# Patient Record
Sex: Female | Born: 1935 | Race: White | Hispanic: No | Marital: Single | Smoking: Never smoker
Health system: Southern US, Community
[De-identification: ages and names within clinical notes are randomized; demographics above are authoritative.]

## PROBLEM LIST (undated history)

## (undated) DIAGNOSIS — K219 Gastro-esophageal reflux disease without esophagitis: Secondary | ICD-10-CM

## (undated) DIAGNOSIS — I10 Essential (primary) hypertension: Secondary | ICD-10-CM

## (undated) DIAGNOSIS — M1990 Unspecified osteoarthritis, unspecified site: Secondary | ICD-10-CM

## (undated) DIAGNOSIS — E785 Hyperlipidemia, unspecified: Secondary | ICD-10-CM

## (undated) DIAGNOSIS — T7840XA Allergy, unspecified, initial encounter: Secondary | ICD-10-CM

## (undated) DIAGNOSIS — Z8619 Personal history of other infectious and parasitic diseases: Secondary | ICD-10-CM

## (undated) DIAGNOSIS — J189 Pneumonia, unspecified organism: Secondary | ICD-10-CM

## (undated) DIAGNOSIS — F0390 Unspecified dementia without behavioral disturbance: Secondary | ICD-10-CM

## (undated) DIAGNOSIS — Z9289 Personal history of other medical treatment: Secondary | ICD-10-CM

## (undated) HISTORY — DX: Allergy, unspecified, initial encounter: T78.40XA

## (undated) HISTORY — DX: Personal history of other medical treatment: Z92.89

## (undated) HISTORY — DX: Personal history of other infectious and parasitic diseases: Z86.19

## (undated) HISTORY — DX: Gastro-esophageal reflux disease without esophagitis: K21.9

## (undated) HISTORY — DX: Hyperlipidemia, unspecified: E78.5

## (undated) HISTORY — DX: Essential (primary) hypertension: I10

## (undated) HISTORY — PX: EYE SURGERY: SHX253

---

## 1999-06-30 ENCOUNTER — Encounter: Admission: RE | Admit: 1999-06-30 | Discharge: 1999-06-30 | Payer: Self-pay | Admitting: Family Medicine

## 1999-06-30 ENCOUNTER — Encounter: Payer: Self-pay | Admitting: Family Medicine

## 2000-07-02 ENCOUNTER — Encounter: Admission: RE | Admit: 2000-07-02 | Discharge: 2000-07-02 | Payer: Self-pay | Admitting: Internal Medicine

## 2000-07-02 ENCOUNTER — Encounter: Payer: Self-pay | Admitting: Internal Medicine

## 2001-07-14 ENCOUNTER — Encounter: Payer: Self-pay | Admitting: Internal Medicine

## 2001-07-14 ENCOUNTER — Encounter: Admission: RE | Admit: 2001-07-14 | Discharge: 2001-07-14 | Payer: Self-pay | Admitting: Internal Medicine

## 2001-10-03 ENCOUNTER — Encounter: Admission: RE | Admit: 2001-10-03 | Discharge: 2001-10-03 | Payer: Self-pay | Admitting: *Deleted

## 2001-10-03 ENCOUNTER — Encounter: Payer: Self-pay | Admitting: *Deleted

## 2002-06-19 ENCOUNTER — Emergency Department (HOSPITAL_COMMUNITY): Admission: EM | Admit: 2002-06-19 | Discharge: 2002-06-19 | Payer: Self-pay | Admitting: Emergency Medicine

## 2002-06-29 ENCOUNTER — Emergency Department (HOSPITAL_COMMUNITY): Admission: EM | Admit: 2002-06-29 | Discharge: 2002-06-29 | Payer: Self-pay | Admitting: Emergency Medicine

## 2002-07-16 ENCOUNTER — Encounter: Payer: Self-pay | Admitting: *Deleted

## 2002-07-16 ENCOUNTER — Encounter: Admission: RE | Admit: 2002-07-16 | Discharge: 2002-07-16 | Payer: Self-pay | Admitting: *Deleted

## 2003-08-04 ENCOUNTER — Encounter: Admission: RE | Admit: 2003-08-04 | Discharge: 2003-08-04 | Payer: Self-pay | Admitting: Family Medicine

## 2004-04-13 ENCOUNTER — Ambulatory Visit: Payer: Self-pay | Admitting: Family Medicine

## 2004-06-21 ENCOUNTER — Ambulatory Visit: Payer: Self-pay | Admitting: Family Medicine

## 2004-09-13 ENCOUNTER — Encounter: Admission: RE | Admit: 2004-09-13 | Discharge: 2004-09-13 | Payer: Self-pay | Admitting: Family Medicine

## 2005-07-18 ENCOUNTER — Ambulatory Visit: Payer: Self-pay | Admitting: Family Medicine

## 2005-07-18 ENCOUNTER — Other Ambulatory Visit: Admission: RE | Admit: 2005-07-18 | Discharge: 2005-07-18 | Payer: Self-pay | Admitting: Family Medicine

## 2005-07-18 ENCOUNTER — Encounter: Payer: Self-pay | Admitting: Family Medicine

## 2005-07-18 LAB — CONVERTED CEMR LAB: Pap Smear: NEGATIVE

## 2005-07-20 ENCOUNTER — Encounter: Payer: Self-pay | Admitting: Family Medicine

## 2005-07-20 LAB — CONVERTED CEMR LAB: Pap Smear: NORMAL

## 2005-08-07 ENCOUNTER — Ambulatory Visit: Payer: Self-pay | Admitting: Family Medicine

## 2005-08-09 ENCOUNTER — Ambulatory Visit: Payer: Self-pay | Admitting: Family Medicine

## 2005-09-20 ENCOUNTER — Encounter: Admission: RE | Admit: 2005-09-20 | Discharge: 2005-09-20 | Payer: Self-pay | Admitting: Family Medicine

## 2006-03-12 LAB — HM DEXA SCAN

## 2006-05-10 ENCOUNTER — Encounter: Admission: RE | Admit: 2006-05-10 | Discharge: 2006-05-10 | Payer: Self-pay | Admitting: Family Medicine

## 2007-01-02 ENCOUNTER — Encounter: Payer: Self-pay | Admitting: Family Medicine

## 2007-01-03 ENCOUNTER — Encounter: Payer: Self-pay | Admitting: Family Medicine

## 2007-01-03 DIAGNOSIS — I10 Essential (primary) hypertension: Secondary | ICD-10-CM

## 2007-01-03 DIAGNOSIS — J309 Allergic rhinitis, unspecified: Secondary | ICD-10-CM | POA: Insufficient documentation

## 2007-01-03 DIAGNOSIS — K219 Gastro-esophageal reflux disease without esophagitis: Secondary | ICD-10-CM | POA: Insufficient documentation

## 2007-01-03 DIAGNOSIS — E785 Hyperlipidemia, unspecified: Secondary | ICD-10-CM | POA: Insufficient documentation

## 2007-01-07 ENCOUNTER — Ambulatory Visit: Payer: Self-pay | Admitting: Family Medicine

## 2007-01-07 DIAGNOSIS — N390 Urinary tract infection, site not specified: Secondary | ICD-10-CM

## 2007-01-07 DIAGNOSIS — T50995A Adverse effect of other drugs, medicaments and biological substances, initial encounter: Secondary | ICD-10-CM | POA: Insufficient documentation

## 2007-01-07 DIAGNOSIS — E039 Hypothyroidism, unspecified: Secondary | ICD-10-CM | POA: Insufficient documentation

## 2007-01-07 LAB — CONVERTED CEMR LAB
Bilirubin Urine: NEGATIVE
Glucose, Urine, Semiquant: NEGATIVE
Specific Gravity, Urine: 1.02
Urobilinogen, UA: 0.2
pH: 6

## 2007-01-08 LAB — CONVERTED CEMR LAB
Alkaline Phosphatase: 78 units/L (ref 39–117)
BUN: 19 mg/dL (ref 6–23)
Basophils Relative: 0.4 % (ref 0.0–1.0)
Bilirubin, Direct: 0.1 mg/dL (ref 0.0–0.3)
CO2: 33 meq/L — ABNORMAL HIGH (ref 19–32)
Cholesterol: 244 mg/dL (ref 0–200)
GFR calc Af Amer: 90 mL/min
Glucose, Bld: 93 mg/dL (ref 70–99)
Hemoglobin: 14.2 g/dL (ref 12.0–15.0)
Lymphocytes Relative: 22.6 % (ref 12.0–46.0)
MCV: 88 fL (ref 78.0–100.0)
Monocytes Absolute: 0.6 10*3/uL (ref 0.2–0.7)
Monocytes Relative: 9.4 % (ref 3.0–11.0)
Neutro Abs: 4.6 10*3/uL (ref 1.4–7.7)
Potassium: 4.8 meq/L (ref 3.5–5.1)
TSH: 1.54 microintl units/mL (ref 0.35–5.50)
Total Protein: 6.8 g/dL (ref 6.0–8.3)
VLDL: 30 mg/dL (ref 0–40)

## 2007-01-30 ENCOUNTER — Encounter: Payer: Self-pay | Admitting: Family Medicine

## 2007-01-30 ENCOUNTER — Encounter: Admission: RE | Admit: 2007-01-30 | Discharge: 2007-01-30 | Payer: Self-pay | Admitting: Family Medicine

## 2007-02-13 ENCOUNTER — Telehealth: Payer: Self-pay | Admitting: Family Medicine

## 2007-04-10 ENCOUNTER — Ambulatory Visit: Payer: Self-pay | Admitting: Family Medicine

## 2007-04-15 ENCOUNTER — Ambulatory Visit: Payer: Self-pay | Admitting: Family Medicine

## 2007-04-21 LAB — CONVERTED CEMR LAB
ALT: 19 units/L (ref 0–35)
AST: 19 units/L (ref 0–37)
Bilirubin, Direct: 0.2 mg/dL (ref 0.0–0.3)
Cholesterol: 229 mg/dL (ref 0–200)
Total Bilirubin: 0.8 mg/dL (ref 0.3–1.2)
Total Protein: 6.7 g/dL (ref 6.0–8.3)

## 2007-04-22 ENCOUNTER — Encounter: Payer: Self-pay | Admitting: Family Medicine

## 2008-01-29 ENCOUNTER — Ambulatory Visit: Payer: Self-pay | Admitting: Family Medicine

## 2008-01-29 ENCOUNTER — Encounter: Payer: Self-pay | Admitting: Family Medicine

## 2008-01-29 ENCOUNTER — Other Ambulatory Visit: Admission: RE | Admit: 2008-01-29 | Discharge: 2008-01-29 | Payer: Self-pay | Admitting: Family Medicine

## 2008-01-29 DIAGNOSIS — E559 Vitamin D deficiency, unspecified: Secondary | ICD-10-CM | POA: Insufficient documentation

## 2008-01-29 DIAGNOSIS — D649 Anemia, unspecified: Secondary | ICD-10-CM | POA: Insufficient documentation

## 2008-01-29 DIAGNOSIS — J45909 Unspecified asthma, uncomplicated: Secondary | ICD-10-CM | POA: Insufficient documentation

## 2008-01-29 LAB — CONVERTED CEMR LAB
Nitrite: NEGATIVE
Urobilinogen, UA: 1

## 2008-01-29 LAB — HM PAP SMEAR

## 2008-02-01 LAB — CONVERTED CEMR LAB
ALT: 18 units/L (ref 0–35)
AST: 19 units/L (ref 0–37)
Albumin: 3.6 g/dL (ref 3.5–5.2)
Alkaline Phosphatase: 72 units/L (ref 39–117)
BUN: 19 mg/dL (ref 6–23)
CO2: 30 meq/L (ref 19–32)
Eosinophils Relative: 1.4 % (ref 0.0–5.0)
GFR calc Af Amer: 79 mL/min
Glucose, Bld: 99 mg/dL (ref 70–99)
HCT: 41.4 % (ref 36.0–46.0)
Hemoglobin: 14.3 g/dL (ref 12.0–15.0)
Monocytes Absolute: 0.4 10*3/uL (ref 0.1–1.0)
Monocytes Relative: 6.8 % (ref 3.0–12.0)
Platelets: 192 10*3/uL (ref 150–400)
Potassium: 4 meq/L (ref 3.5–5.1)
Total CHOL/HDL Ratio: 4
Total Protein: 7 g/dL (ref 6.0–8.3)
Triglycerides: 115 mg/dL (ref 0–149)
WBC: 6.4 10*3/uL (ref 4.5–10.5)

## 2008-02-03 LAB — CONVERTED CEMR LAB: Vit D, 1,25-Dihydroxy: 21 — ABNORMAL LOW (ref 30–89)

## 2008-02-04 ENCOUNTER — Encounter: Payer: Self-pay | Admitting: Family Medicine

## 2008-02-13 ENCOUNTER — Ambulatory Visit: Payer: Self-pay | Admitting: Family Medicine

## 2008-02-17 ENCOUNTER — Encounter: Payer: Self-pay | Admitting: Family Medicine

## 2009-04-06 ENCOUNTER — Emergency Department (HOSPITAL_COMMUNITY): Admission: EM | Admit: 2009-04-06 | Discharge: 2009-04-06 | Payer: Self-pay | Admitting: Emergency Medicine

## 2009-04-20 ENCOUNTER — Ambulatory Visit: Payer: Self-pay | Admitting: Family Medicine

## 2009-04-20 DIAGNOSIS — M810 Age-related osteoporosis without current pathological fracture: Secondary | ICD-10-CM | POA: Insufficient documentation

## 2009-04-20 LAB — CONVERTED CEMR LAB
Ketones, urine, test strip: NEGATIVE
Nitrite: NEGATIVE
Specific Gravity, Urine: 1.02
Urobilinogen, UA: 0.2

## 2009-04-21 ENCOUNTER — Encounter: Payer: Self-pay | Admitting: Family Medicine

## 2009-04-21 ENCOUNTER — Encounter: Admission: RE | Admit: 2009-04-21 | Discharge: 2009-04-21 | Payer: Self-pay | Admitting: Family Medicine

## 2009-04-25 LAB — CONVERTED CEMR LAB
ALT: 17 units/L (ref 0–35)
AST: 23 units/L (ref 0–37)
Albumin: 3.8 g/dL (ref 3.5–5.2)
Alkaline Phosphatase: 78 units/L (ref 39–117)
Basophils Relative: 0.1 % (ref 0.0–3.0)
CO2: 27 meq/L (ref 19–32)
Calcium: 10.2 mg/dL (ref 8.4–10.5)
GFR calc non Af Amer: 64.68 mL/min (ref >60)
Glucose, Bld: 96 mg/dL (ref 70–99)
HCT: 42.4 % (ref 36.0–46.0)
HDL: 62.7 mg/dL (ref >39.00)
Hemoglobin: 13.9 g/dL (ref 12.0–15.0)
Lymphocytes Relative: 19.7 % (ref 12.0–46.0)
Lymphs Abs: 1.2 10*3/uL (ref 0.7–4.0)
Monocytes Relative: 5.2 % (ref 3.0–12.0)
Neutro Abs: 4.7 10*3/uL (ref 1.4–7.7)
Potassium: 4.1 meq/L (ref 3.5–5.1)
RBC: 4.7 M/uL (ref 3.87–5.11)
Sodium: 139 meq/L (ref 135–145)
TSH: 1.92 microintl units/mL (ref 0.35–5.50)
Total CHOL/HDL Ratio: 4
Total Protein: 7.3 g/dL (ref 6.0–8.3)
Triglycerides: 128 mg/dL (ref 0.0–149.0)

## 2009-05-19 ENCOUNTER — Ambulatory Visit: Payer: Self-pay | Admitting: Family Medicine

## 2009-06-08 ENCOUNTER — Encounter: Payer: Self-pay | Admitting: Family Medicine

## 2009-06-15 ENCOUNTER — Ambulatory Visit: Payer: Self-pay | Admitting: Family Medicine

## 2009-06-15 DIAGNOSIS — R05 Cough: Secondary | ICD-10-CM

## 2009-06-23 ENCOUNTER — Telehealth: Payer: Self-pay | Admitting: Family Medicine

## 2009-07-19 ENCOUNTER — Ambulatory Visit: Payer: Self-pay | Admitting: Family Medicine

## 2009-09-01 ENCOUNTER — Ambulatory Visit: Payer: Self-pay | Admitting: Family Medicine

## 2010-01-12 ENCOUNTER — Telehealth: Payer: Self-pay | Admitting: Family Medicine

## 2010-04-11 NOTE — Letter (Signed)
Summary: Generic Letter  [place] at [facility]  [address] [place], [place] [address]   Phone: [phone]  Fax: [phone]    09/01/2009    To Whom It May Concern:           Jackelin Correia (DOB Jul 22, 1931) was seen and examined by me on this date. She was given a physical exam and evaluated thoroughly regarding muscular-skeletal and neurological status.  She is found to be strong and physically fit to work.  If any questions please do not hesitate to call me.        Sincerely,      Dr. Hosie Spangle. Alphonzo Severance. MD

## 2010-04-11 NOTE — Letter (Signed)
Summary: Generic Letter  [place] at [facility]  [address] [place], [place] [address]   Phone: [phone]  Fax: [phone]    06/08/2009  Kelly Conrad [address] [place], [place]  [address]  Dear [name],   Hemocults are all negative.        Sincerely,   Dr. Raoul Pitch

## 2010-04-11 NOTE — Assessment & Plan Note (Signed)
Summary: deep non-productive cough/cjr   Vital Signs:  Patient profile:   77 year old female Weight:      148 pounds O2 Sat:      95 % Temp:     98.1 degrees F Pulse rate:   95 / minute BP sitting:   110 / 76  (left arm) Cuff size:   regular  Vitals Entered By: Pura Spice, RN (June 15, 2009 10:04 AM) CC: cough sneezing tussin helping    History of Present Illness: DiCenso 74-year-old white female with a physician today complaining of a call, nonproductive that has been present in the past for some time her son has helped her in previously and in the past she has had a bronchitis but this time has no fever as stated a nonproductive cough the cough that aggravates more at night Hypertension is controlled as well as her GERD Bone density recently revealed that she has osteoporosisand I feel that she needs to have a biphosphonateplus calcium and vitamin D  Allergies: 1)  ! 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    Physical Exam  General:  Well-developed,well-nourished,in no acute distress; alert,appropriate and cooperative throughout examination Head:  Normocephalic and atraumatic without obvious abnormalities. No apparent alopecia or balding. Eyes:  No corneal or conjunctival inflammation noted. EOMI. Perrla. Funduscopic exam benign, without hemorrhages, exudates or papilledema. Vision grossly normal. Ears:  External ear exam shows no significant lesions or deformities.  Otoscopic examination reveals clear canals, tympanic membranes are intact bilaterally without bulging, retraction, inflammation or discharge. Hearing is grossly normal bilaterally. Nose:  lower moderate nasal congestion, pale boggy mucosa with clear drainage Mouth:  Oral mucosa and oropharynx without lesions or exudates.  Teeth in good repair. Lungs:  Normal respiratory effort, chest expands symmetrically. Lungs are clear to auscultation, no crackles or wheezes. Heart:  Normal rate and regular rhythm. S1 and S2 normal without gallop, murmur, click, rub or other extra sounds. Extremities:  No clubbing, cyanosis, edema, or deformity noted with normal full range of motion of all joints.     Impression & Recommendations:  Problem # 1:  COUGH (ICD-786.2) Assessment Deteriorated  Orders: T-2 View CXR (71020TC) Prescription Created Electronically [phone]) tramadol q.i.d. plus OTC antihistamine  Problem # 2:  OSTEOPOROSIS (ICD-733.00) Assessment: Unchanged  The following medications were removed from the medication list:    Actonel 150 Mg Tabs (Risedronate sodium) .Marland Kitchen... 1 by mouth every month Her updated medication list for this problem includes:    Caltrate 600+d Plus 600-400 Mg-unit Tabs (Calcium carbonate-vit d-min)    Vitamin D (ergocalciferol) 50000 Unit Caps (  Ergocalciferol) .Marland Kitchen... 1 cap weekly x 12 weeks    Fosamax 35 Mg Tabs (Alendronate sodium) .Marland Kitchen... 1 by mouth  weekly  Problem # 3:  GERD (ICD-530.81) Assessment: Improved  Her updated medication list for this problem includes:    Omeprazole 20 Mg Cpdr (Omeprazole) ..... Once daily  Problem # 4:  HYPERTENSION (ICD-401.9) Assessment: Improved  Her updated medication list for this problem includes:    Hydrochlorothiazide 25 Mg Tabs (Hydrochlorothiazide) ..... Once daily    Lisinopril 20 Mg Tabs (Lisinopril) .Marland Kitchen... 1 once daily for blood pressure  Problem # 5:  ALLERGIC RHINITIS (ICD-477.9) Assessment: Unchanged  Complete Medication List: 1)  Hydrochlorothiazide 25 Mg Tabs (Hydrochlorothiazide) .... Once daily 2)  Omeprazole 20 Mg Cpdr (Omeprazole) .... Once daily 3)  Aspirin Adult Low Strength 81 Mg Tbec (Aspirin) .Marland Kitchen.. 1 once daily 4)  Wal-tussin Cough/cold 30-15 Mg/31ml Syrp (Pseudoephedrine-dm) 5)  Caltrate 600+d Plus 600-400 Mg-unit Tabs (Calcium carbonate-vit d-min) 6)  Lisinopril 20 Mg Tabs (Lisinopril) .Marland Kitchen.. 1 once daily for blood pressure 7)  Vitamin D (ergocalciferol) 50000 Unit Caps (Ergocalciferol) .Marland Kitchen.. 1 cap weekly x 12 weeks 8)  Simvastatin 80 Mg Tabs (Simvastatin) .Marland Kitchen.. 1 hs for lipids 9)  Hydromet 5-1.5 Mg/20ml Syrp (Hydrocodone-homatropine) .Marland Kitchen.. 1-2 tsp q4h as needed cough 10)  Tramadol Hcl 50 Mg Tabs (Tramadol hcl) .Marland Kitchen.. 1 qid to prevent cough 11)  Fosamax 35 Mg Tabs (Alendronate sodium) .Marland Kitchen.. 1 by mouth weekly  Patient Instructions: 1)  I feel that she have a chronic cough and most lateral he did to allergy 2)  since the cough has been persistent I feel that we should get a chest x-ray at this time 3)  We'll prescribe tramadol 4 times a day to stop the cough as well as  4)  Over-the-counter Claritin 10 mg each day 5)  Start Actonel plus calcium and vitamin D for your osteoporosis 6)  continue omeprazole for your indigestion or GERD Prescriptions: FOSAMAX 35 MG TABS (ALENDRONATE SODIUM) 1 by mouth weekly  #4 x 11   Entered by:   Pura Spice, RN   Authorized by:   Judithann Sheen MD   Signed by:   Pura Spice, RN on 06/23/2009   Method used:   [facility]  [place] [address]       [address]       [place], [place]  [address]       Ph: [phone] or [phone]       Fax: [phone]   RxID:   [id number] TRAMADOL HCL 50 MG TABS (TRAMADOL HCL) 1 qid to prevent cough  #60 x 5   Entered and Authorized by:   Judithann Sheen MD   Signed by:   Judithann Sheen MD on 06/15/2009   Method used:   [facility]        [facility]  [place] [address] ([facility]       [address]       [place], [place]  [address]       Ph: [phone] or [phone]       Fax: [phone]   RxID:   [id number] HYDROMET 5-1.5 MG/5ML SYRP (HYDROCODONE-HOMATROPINE) 1-2 tsp q4h as needed cough  #360 cc x 2   Entered and Authorized by:   Judithann Sheen MD   Signed by:   Judithann Sheen MD on 06/15/2009  Method used:   Print then Give to Patient   RxID:   [id number]

## 2010-04-11 NOTE — Assessment & Plan Note (Signed)
Summary: 3 month fup//ccm   Vital Signs:  Patient profile:   77 year old female Weight:      150 pounds O2 Sat:      96 % Temp:     97.9 degrees F Pulse rate:   82 / minute BP sitting:   110 / 60  (left arm)  Vitals Entered By: Pura Spice, RN (Jul 19, 2009 11:33 AM) CC: ROV ? need refill on tramadol  follow up cough cxr report    History of Present Illness: This 77 year old white female is into have a follow up visit regarding her chronic cough which she has had in the past. Review of chest x-ray which revealed no cardiopulmonary problems since use of tramadol q.n. today and the Hydromet when needed, the patient had greatly improved and relates she is coughing and definitely nonproductive Patient relates she feels very good  Allergies: 1)  ! 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    Physical Exam  General:  Well-developed,well-nourished,in no acute distress; alert,appropriate and  cooperative throughout examination Head:  Normocephalic and atraumatic without obvious abnormalities. No apparent alopecia or balding. Eyes:  No corneal or conjunctival inflammation noted. EOMI. Perrla. Funduscopic exam benign, without hemorrhages, exudates or papilledema. Vision grossly normal. Ears:  External ear exam shows no significant lesions or deformities.  Otoscopic examination reveals clear canals, tympanic membranes are intact bilaterally without bulging, retraction, inflammation or discharge. Hearing is grossly normal bilaterally. Nose:  External nasal examination shows no deformity or inflammation. Nasal mucosa are pink and moist without lesions or exudates. Mouth:  Oral mucosa and oropharynx without lesions or exudates.  Teeth in good repair. Neck:  No deformities, masses, or tenderness noted. Chest Wall:  No deformities, masses, or tenderness noted. Lungs:  Normal respiratory effort, chest expands symmetrically. Lungs are clear to auscultation, no crackles or wheezes. Heart:  Normal rate and regular rhythm. S1 and S2 normal without gallop, murmur, click, rub or other extra sounds. Extremities:  No clubbing, cyanosis, edema, or deformity noted with normal full range of motion of all joints.     Impression & Recommendations:  Problem # 1:  COUGH (ICD-786.2) Assessment Improved  Problem # 2:  OSTEOPOROSIS (ICD-733.00) Assessment: Improved  Her updated medication list for this problem includes:    Caltrate 600+d Plus 600-400 Mg-unit Tabs (Calcium carbonate-vit d-min)    Vitamin D (ergocalciferol) 50000 Unit Caps (Ergocalciferol) .Marland Kitchen... 1 cap weekly x 12 weeks    Fosamax 35 Mg Tabs (Alendronate sodium) .Marland Kitchen... 1 by mouth weekly  Problem # 3:  GERD (ICD-530.81) Assessment: Improved  Her updated medication list for this problem includes:    Omeprazole 20  Mg Cpdr (Omeprazole) ..... Once daily  Problem # 4:  HYPERTENSION (ICD-401.9) Assessment: Improved  Her updated medication  list for this problem includes:    Hydrochlorothiazide 25 Mg Tabs (Hydrochlorothiazide) ..... Once daily    Lisinopril 20 Mg Tabs (Lisinopril) .Marland Kitchen... 1 once daily for blood pressure  Complete Medication List: 1)  Hydrochlorothiazide 25 Mg Tabs (Hydrochlorothiazide) .... Once daily 2)  Omeprazole 20 Mg Cpdr (Omeprazole) .... Once daily 3)  Aspirin Adult Low Strength 81 Mg Tbec (Aspirin) .Marland Kitchen.. 1 once daily 4)  Wal-tussin Cough/cold 30-15 Mg/64ml Syrp (Pseudoephedrine-dm) 5)  Caltrate 600+d Plus 600-400 Mg-unit Tabs (Calcium carbonate-vit d-min) 6)  Lisinopril 20 Mg Tabs (Lisinopril) .Marland Kitchen.. 1 once daily for blood pressure 7)  Vitamin D (ergocalciferol) 50000 Unit Caps (Ergocalciferol) .Marland Kitchen.. 1 cap weekly x 12 weeks 8)  Simvastatin 80 Mg Tabs (Simvastatin) .Marland Kitchen.. 1 hs for lipids 9)  Hydromet 5-1.5 Mg/40ml Syrp (Hydrocodone-homatropine) .Marland Kitchen.. 1-2 tsp q4h as needed cough 10)  Tramadol Hcl 50 Mg Tabs (Tramadol hcl) .Marland Kitchen.. 1 qid to prevent cough 11)  Fosamax 35 Mg Tabs (Alendronate sodium) .Marland Kitchen.. 1 by mouth weekly  Patient Instructions: 1)  You are doing very well 2)  no abnormal physical findings 3)  Cough is controlled with tramadol and usde hydrometas needed 4)  Return for routine exam or if any acute problem or new problem do not hesitate to return

## 2010-04-11 NOTE — Progress Notes (Signed)
Summary: REQ FOR RX?  Phone Note Call from Patient   Caller: Kelly Conrad Kelly Conrad) Reason for Call: Talk to Nurse, Talk to Doctor Summary of Call: Called to adv that med: actonel 150 mg cost too much through insurance if Rx is written as it is...Marland KitchenMarland Kitchen Pts POA adv that [employer] (pts drug ins co) adv that because the current Rx is written for pt to take one per month, it costs more and pt only gets one pill.....Marland KitchenMarland KitchenWants to know if a Rx can be written for pt to obtain 3 pills at a time but still only take one per month, because it will be a lower cost for the med / Rx and the pt will end up getting 3 mths worth from one Rx...Marland KitchenMarland KitchenMarland Kitchen   [facility].  Pts POA Kelly Conrad) can be reached at [phone]   or   [phone].   Initial call taken by: Debbra Riding,  June 23, 2009 8:34 AM  Follow-up for Phone Call        ok [facility].  Follow-up by: Pura Spice, RN,  June 23, 2009 2:23 PM

## 2010-04-11 NOTE — Progress Notes (Signed)
Summary: Hydrocodone-Homatropine Syrup  Phone Note Refill Request Message from:  Fax from Pharmacy on January 12, 2010 9:58 AM  Refills Requested: Medication #1:  HYDROMET 5-1.5 MG/5ML SYRP 1-2 tsp q4h as needed cough   Dosage confirmed as above?Dosage Confirmed Please advise refill?  Initial call taken by: Josph Macho RMA,  January 12, 2010 10:00 AM  Follow-up for Phone Call        OK to refill with same sig, #360, 0 rf, just ask her what her symptoms are Follow-up by: Danise Edge MD,  January 12, 2010 1:26 PM  Additional Follow-up for Phone Call Additional follow up Details #1::        I tried to call pt and [phone] is not the correct phone number?. Additional Follow-up by: Josph Macho RMA,  January 12, 2010 1:30 PM    Prescriptions: HYDROMET 5-1.5 MG/5ML SYRP (HYDROCODONE-HOMATROPINE) 1-2 tsp q4h as needed cough  #360 cc x 0   Entered by:   Josph Macho RMA   Authorized by:   Danise Edge MD   Signed by:   Josph Macho RMA on 01/12/2010   Method used:   Telephoned to ...       [facility]  [place] [address] (retail)       [address]       [place], [place]  [address]       Ph: [phone] or [phone]       Fax: [phone]   RxID:   [id number]

## 2010-04-11 NOTE — Assessment & Plan Note (Signed)
Summary: PT WILL COME IN FASTING / CPX // RS   Vital Signs:  Patient profile:   77 year old female Height:      62 inches Weight:      154 pounds BMI:     28.27 O2 Sat:      96 % Temp:     97.6 degrees F Pulse rate:   59 / minute BP sitting:   154 / 88  (left arm) Cuff size:   regular  Vitals Entered By: Pura Spice, RN (April 20, 2009 10:42 AM) CC: go over problems refill meds . pt states dont think has had colonscopy. needs bone density and mammogram.  Is Patient Diabetic? No Pain Assessment Patient in pain? no        History of Present Illness: This 77 year old white female is in today to discuss her medical problems and refill her medication Blood pressure been elevated before and felt lightheaded today one 5488 on arrival and recheck later 156/90 The patient relates she had been doing for a while has occasional cough which using OTC [employer]  tussin Needs to schedule mammogram and bone density. Pneumovax injection 2000 and Relates her frequent indigestion in the past have been control with omeprazole 20 mg each side Peripheral edema has been control since she is taking hydrochlorothiazide 25 mg each day  Allergies: 1)  ! 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[employer]  [employer]  Risk Factors: Smoking Status: never (01/29/2008)  Review of Systems  The patient denies anorexia, fever, weight loss, weight gain, vision loss, decreased hearing, hoarseness, chest pain, syncope, dyspnea on exertion, peripheral edema, prolonged cough, headaches, hemoptysis, abdominal pain, melena, hematochezia, severe indigestion/heartburn, hematuria,  incontinence, genital sores, muscle weakness, suspicious skin lesions, transient blindness, difficulty walking, depression, unusual weight change, abnormal bleeding, enlarged lymph nodes, angioedema, breast masses, and testicular masses.    Physical Exam  General:  Well-developed,well-nourished,in no acute distress; alert,appropriate and cooperative throughout examination Head:  Normocephalic and atraumatic without obvious abnormalities. No apparent alopecia or balding. Eyes:  No corneal or conjunctival inflammation noted. EOMI. Perrla. Funduscopic exam benign, without hemorrhages, exudates or papilledema. Vision grossly normal. Ears:  External ear exam shows no significant lesions or deformities.  Otoscopic examination reveals clear canals, tympanic membranes are intact bilaterally without bulging, retraction, inflammation or discharge. Hearing is grossly normal bilaterally. Nose:  External nasal examination shows no deformity or inflammation. Nasal mucosa are pink and moist without lesions or exudates. Mouth:  new dentures, normal exam Neck:  No deformities, masses, or tenderness noted. Chest Wall:  No deformities, masses, or tenderness noted. Breasts:  No mass, nodules, thickening, tenderness, bulging, retraction, inflamation, nipple discharge or skin changes noted.   Lungs:  Normal respiratory effort, chest expands symmetrically. Lungs are clear to auscultation, no crackles or wheezes. Heart:  Normal rate and regular rhythm. S1 and S2 normal without gallop, murmur, click, rub or other extra sounds. Abdomen:  Bowel sounds positive,abdomen soft and non-tender without masses, organomegaly or hernias noted. Rectal:  not examined Genitalia:  not examined Msk:  No deformity or scoliosis noted of thoracic or lumbar spine.   Pulses:  R and L carotid,radial,femoral,dorsalis pedis and posterior tibial  pulses are full and equal bilaterally Extremities:  No clubbing, cyanosis, edema, or deformity noted  with normal full range of motion of all joints.   Neurologic:  No cranial nerve deficits noted. Station and gait are normal. Plantar reflexes are down-going bilaterally. DTRs are symmetrical throughout. Sensory, motor and coordinative functions appear intact. Skin:  Intact without suspicious lesions or rashes Cervical Nodes:  No lymphadenopathy noted Axillary Nodes:  No palpable lymphadenopathy Inguinal Nodes:  No significant adenopathy Psych:  Cognition and judgment appear intact. Alert and cooperative with normal attention span and concentration. No apparent delusions, illusions, hallucinations   Impression & Recommendations:  Problem # 1:  ANEMIA (ICD-285.9) Assessment Improved  Orders: TLB-CBC Platelet - w/Differential (85025-CBCD)  Problem # 2:  GERD (ICD-530.81) Assessment: Improved  Her updated medication list for this problem includes:    Omeprazole 20 Mg Cpdr (Omeprazole) ..... Once daily  Problem # 3:  OSTEOPOROSIS (ICD-733.00) Assessment: New  The following medications were removed from the medication list:    Vitamin D 04540 Unit Caps (Ergocalciferol) ..... One tab weekly for 12 weeks Her updated medication list for this problem includes:    Caltrate 600+d Plus 600-400 Mg-unit Tabs (Calcium carbonate-vit d-min)    Vitamin D (ergocalciferol) 50000 Unit Caps (Ergocalciferol) .Marland Kitchen... 1 cap weekly x 12 weeks  Problem # 4:  HYPERTENSION (ICD-401.9) Assessment: Deteriorated  lisinopril 20 mg q.d.  Orders: EKG w/ Interpretation (93000) Prescription Created Electronically [phone])  Problem # 5:  HYPERLIPIDEMIA (ICD-272.4) Assessment: Deteriorated simvastatin 80 mg bedtime  Problem # 6:  ALLERGIC RHINITIS (ICD-477.9) Assessment: Unchanged  Complete Medication List: 1)  Hydrochlorothiazide 25 Mg Tabs (Hydrochlorothiazide) .... Once daily 2)  Omeprazole 20 Mg Cpdr (Omeprazole) .... Once daily 3)  Aspirin Adult Low Strength 81 Mg Tbec (Aspirin) .Marland Kitchen.. 1 once daily 4)   Wal-tussin Cough/cold 30-15 Mg/80ml Syrp (Pseudoephedrine-dm) 5)  Caltrate 600+d Plus 600-400 Mg-unit Tabs (Calcium carbonate-vit d-min) 6)  Lisinopril 20 Mg Tabs (Lisinopril) .Marland Kitchen.. 1 once daily for blood pressure 7)  Vitamin D (ergocalciferol) 50000 Unit Caps (Ergocalciferol) .Marland Kitchen.. 1 cap weekly x 12 weeks 8)  Simvastatin 80 Mg Tabs (Simvastatin) .Marland Kitchen.. 1 hs for lipids  Other Orders: Venipuncture (14782) T-Vitamin D (25-Hydroxy) (95621-30865) UA Dipstick w/o Micro (automated)  (81003) TLB-Lipid Panel (80061-LIPID) TLB-BMP (Basic Metabolic Panel-BMET) (80048-METABOL) TLB-Hepatic/Liver Function Pnl (80076-HEPATIC) TLB-TSH (Thyroid Stimulating Hormone) (78469-GEX)  Patient Instructions: 1)  Since your blood pressure and an elevated I am going to start treatment with lisinopril 20 mg each day to control your blood pressure. Return 4-6 weeks for recheck 2)  Bone density reveal osteoporosis left heel recommend starting Actonel one 50 mg monthly 3)  Since her cholesterol studies were elevated and will start simvastatin 80 mg bedtime to better control the lipids Prescriptions: SIMVASTATIN 80 MG TABS (SIMVASTATIN) 1 hs for lipids  #30 x 11   Entered and Authorized by:   Judithann Sheen MD   Signed by:   Judithann Sheen MD on 04/21/2009   Method used:   [facility] [address]       [address]       [place], [place]  [address]       Ph: [phone] or [phone]       Fax: [phone]   RxID:   [id number] VITAMIN D (ERGOCALCIFEROL) 50000 UNIT CAPS (ERGOCALCIFEROL) 1 cap weekly x 12 weeks  #12 x 1   Entered  and Authorized by:   Judithann Sheen MD   Signed by:   Judithann Sheen MD on 04/21/2009   Method used:   [facility]        [facility] [address] ([facility]ail[facility]       [address]       [place], [place]  [address]       Ph: [phone] or [phone]       Fax: [phone]   RxID:   [id number] LISINOPRIL 20 MG TABS  (LISINOPRIL) 1 once daily FOR BLOOD PRESSURE  #30 x 11   Entered and Authorized by:   Judithann Sheen MD   Signed by:   Judithann Sheen MD on 04/20/2009   Method used:   [facility]        [facility] [address] ([facility]       [address]       [place], [place]  [address]       Ph: [phone] or [phone]       Fax: [phone]   RxID:   [id number] SIMVASTATIN 40 MG TABS (SIMVASTATIN) once daily at bedtiime  #30 x 11   Entered and Authorized by:   Judithann Sheen MD   Signed by:   Judithann Sheen MD on 04/20/2009   Method used:   [facility]        [facility] [address]       [address]       [place], [place]  [address]       Ph: [phone] or [phone]       Fax: [phone]   RxID:   [id number] OMEPRAZOLE 20 MG CPDR (OMEPRAZOLE) once daily  #30 x 11   Entered and Authorized by:   Judithann Sheen MD   Signed by:   Judithann Sheen MD on 04/20/2009   Method used:   [facility]        [facility] [address]ail[facility]       [address]       [place], [place]  [address]       Ph: [phone] or [phone]       Fax: [phone]   RxID:   [id number] HYDROCHLOROTHIAZIDE 25 MG TABS (HYDROCHLOROTHIAZIDE) once daily  #30 x 11   Entered and Authorized by:   Judithann Sheen MD   Signed by:   Judithann Sheen MD on 04/20/2009   Method used:   [facility]        [facility] [address] [facility]       [address]       [place], [place]  [address]       Ph: [phone] or [phone]       Fax: [phone]   RxID:   [id number]    Laboratory Results   Urine Tests    Routine Urinalysis   Color: yellow Appearance: Clear Glucose: negative   (Normal Range: Negative) Bilirubin: negative   (Normal Range: Negative) Ketone: negative   (Normal Range: Negative) Spec. Gravity: 1.020   (Normal Range: 1.003-1.035) Blood: negative   (Normal Range: Negative) pH: 6.5   (Normal Range:  5.0-8.0) Protein: negative   (Normal Range: Negative) Urobilinogen: 0.2   (Normal Range: 0-1) Nitrite: negative   (Normal Range: Negative) Leukocyte Esterace:  1+   (Normal Range: Negative)    Comments: Rita Ohara  April 20, 2009 12:00 PM

## 2010-04-11 NOTE — Assessment & Plan Note (Signed)
Summary: PT NEED LETTER TO GO TO WORK/RCD   Vital Signs:  Patient profile:   77 year old female Weight:      148 pounds O2 Sat:      94 % Temp:     97.9 degrees F Pulse rate:   70 / minute Pulse rhythm:   regular BP sitting:   104 / 60  (left arm) Cuff size:   regular  Vitals Entered By: Pura Spice, RN (September 01, 2009 10:56 AM) CC: needs letter for work.    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. 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 Room for her knee and not to state she is physically able to left patient's as well as do the work which he required she explained and discussed her job responsibilities and I will examine her and send a letter to her employer She has no complaints and explains to me what she does in the dinni  Allergies: 1)  ! 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    Physical  Exam  General:  Well-developed,well-nourished,in no acute distress; alert,appropriate and cooperative throughout examination Chest Wall:  No deformities, masses, or tenderness noted. Lungs:  Normal respiratory effort, chest expands symmetrically. Lungs are clear to auscultation, no crackles or wheezes. Heart:  Normal rate and regular rhythm. S1 and S2 normal without gallop, murmur, click, rub or other extra sounds. Abdomen:  Bowel sounds positive,abdomen soft and non-tender without masses, organomegaly or hernias noted. Msk:  No deformity or scoliosis noted of thoracic or lumbar spine.   excellent muscle strength in all extremities Neurologic:  note neurological deficits   Impression & Recommendations:  Problem # 1:  PHYSICAL EXAMINATION (ICD-V70.0) Assessment New candidate in good physical condition and her strong for female of her age  Problem # 2:  HYPERTENSION (ICD-401.9) Assessment: Improved  Her updated medication list for this problem includes:    Hydrochlorothiazide 25 Mg Tabs (Hydrochlorothiazide) ..... Once daily    Lisinopril 20 Mg Tabs (Lisinopril) .Marland Kitchen... 1 once daily for blood pressure  Complete Medication List: 1)  Hydrochlorothiazide 25 Mg Tabs (Hydrochlorothiazide) .... Once daily 2)  Omeprazole 20 Mg Cpdr (Omeprazole) .... Once daily 3)  Aspirin Adult Low Strength 81 Mg Tbec (Aspirin) .Marland Kitchen.. 1 once daily 4)  Wal-tussin Cough/cold 30-15 Mg/22ml Syrp (Pseudoephedrine-dm) 5)  Caltrate 600+d Plus 600-400 Mg-unit Tabs (Calcium carbonate-vit d-min) 6)  Lisinopril 20 Mg Tabs (Lisinopril) .Marland Kitchen.. 1 once daily for blood pressure 7)  Vitamin D (ergocalciferol) 50000 Unit Caps (Ergocalciferol) .Marland Kitchen.. 1 cap weekly x 12 weeks 8)  Simvastatin 80 Mg Tabs (Simvastatin) .Marland KitchenMarland KitchenMarland Kitchen  1 hs for lipids 9)  Hydromet 5-1.5 Mg/56ml Syrp (Hydrocodone-homatropine) .Marland Kitchen.. 1-2 tsp q4h as needed cough 10)  Tramadol Hcl 50 Mg Tabs (Tramadol hcl) .Marland Kitchen.. 1 qid to prevent cough 11)  Fosamax 35 Mg Tabs (Alendronate  sodium) .Marland Kitchen.. 1 by mouth weekly  Patient Instructions: 1)  Positive urine good physical condition tube care doctor or soft doses of her job and I will secure lead her to take to your emplower

## 2010-05-08 ENCOUNTER — Other Ambulatory Visit: Payer: Self-pay | Admitting: Family Medicine

## 2010-05-10 ENCOUNTER — Telehealth: Payer: Self-pay | Admitting: Family Medicine

## 2010-05-10 NOTE — Telephone Encounter (Signed)
Triage vm----please verify is she is to take Lipitor 20mg  , 1qam and 1qpm. Lipitor 40 was called to her pharmacy. Please return his cal. She only has 2 pills left.

## 2010-05-11 NOTE — Telephone Encounter (Signed)
Kelly Conrad came in to office this morning, and said that the medicine in question, is Lisinopril not Lipitor. Pt thought she had been told to take two 20mg  of Lisinopril a day, but when she does that, pt ends up running out of med to soon. Pt is wondering if she could get a 40 mg dose of Lisinopril and take it once a day? Pls call and clarify what dosage pt is suppose to be taking. Pt is out of Lisinopril now. Pls [facility] to [facility].

## 2010-05-16 NOTE — Telephone Encounter (Signed)
Continue 1 lisinopril 20 mg qd

## 2010-05-17 ENCOUNTER — Other Ambulatory Visit: Payer: Self-pay | Admitting: Family Medicine

## 2010-06-04 ENCOUNTER — Other Ambulatory Visit: Payer: Self-pay | Admitting: Family Medicine

## 2010-08-11 ENCOUNTER — Other Ambulatory Visit: Payer: Self-pay | Admitting: Family Medicine

## 2010-08-29 ENCOUNTER — Other Ambulatory Visit: Payer: Self-pay

## 2010-08-29 MED ORDER — HYDROCODONE-HOMATROPINE 5-1.5 MG PO TABS: ORAL_TABLET | ORAL | Status: DC

## 2010-08-29 NOTE — Telephone Encounter (Signed)
rx sent into pharmacy

## 2010-08-30 ENCOUNTER — Other Ambulatory Visit: Payer: Self-pay

## 2010-08-30 MED ORDER — HYDROCODONE-HOMATROPINE 5-1.5 MG PO TABS: ORAL_TABLET | ORAL | Status: DC

## 2010-08-30 MED ORDER — HYDROCODONE-HOMATROPINE 5-1.5 MG/5ML PO SYRP: 5.0000 mL | ORAL_SOLUTION | Freq: Four times a day (QID) | ORAL | Status: AC | PRN

## 2010-08-30 NOTE — Telephone Encounter (Signed)
rx sent in for hycodan syrup;

## 2010-09-07 ENCOUNTER — Other Ambulatory Visit: Payer: Self-pay | Admitting: Family Medicine

## 2010-09-25 ENCOUNTER — Telehealth: Payer: Self-pay | Admitting: Family Medicine

## 2010-09-25 NOTE — Telephone Encounter (Signed)
Walk in---------power of attorney, Kelly Conrad walks in today having concerns about Kelly Conrad. She lost her job today. Kelly Conrad thinks that it maybe early signs of dementia. For example, Kelly Conrad would tell her something, she would say or do the opposite. Kelly Conrad believes why she lost her job do to her mental state. Kelly Conrad took her to the Employment office this morning and Kelly Conrad may have signed something there that may keep her from getting benefits. Kelly Conrad enjoys working, but her issues are interfering. Wants Dr Scotty Court to call him.

## 2010-09-27 NOTE — Telephone Encounter (Signed)
Power of [place], [place], called back again req call back from Dr Scotty Court. Pt is getting worse and [name] is very concerned.

## 2010-09-28 NOTE — Telephone Encounter (Signed)
Per Dr. Stafford he will call pt.  

## 2010-09-29 ENCOUNTER — Telehealth: Payer: Self-pay | Admitting: *Deleted

## 2010-09-29 NOTE — Telephone Encounter (Signed)
Pt has an appt for 10/03/10 at 3 pm

## 2010-09-29 NOTE — Telephone Encounter (Signed)
Pt is coming in for an office visit on 10/03/2010 at 3 pm.

## 2010-09-29 NOTE — Telephone Encounter (Signed)
[facility] Triage Call Report Triage Record Num: [id number] Operator: Kelly Conrad Patient Name: Kelly Conrad Call Date & Time: 09/28/2010 8:23:51PM Patient Phone: [phone] PCP: Kelly Conrad Patient Gender: Female PCP Fax : [phone] Patient DOB: Oct 30, 1931 Practice Name: Kelly Conrad Reason for Call: Kelly Conrad, Other, calling regarding Other. PCP is Kelly Conrad [facility]). Callback number is [phone]. Kelly Conrad/Friend states he spoke with office on 09/25/10-requesting callback from Dr. Scotty Conrad. Has been trying to call office to schedule appt in office. States Kelly Conrad lost job on 7/16 due to forgetfullness. Does not complete tasks. Needs frequent instruction." Fiddles with her fingers". Less verbal communication for several months. Advised to call office in AM to schedule appointment. Care advice given. Protocol(s) Used: Dementia Recommended Outcome per Protocol: Call Provider within 24 Hours Reason for Outcome: New or increasing symptoms AND taking medications/following therapy as prescribed Care Advice: ~ Should not be alone, arrange for support (family member, friend, etc.). ~ Follow current treatment plan until evaluated by provider. ~ Do not allow to drive; consider safety of others as well as the individual. ~ Call EMS 911 if behavior is threatening or becomes harmful to others or self. ~ SYMPTOM / CONDITION MANAGEMENT If recent change in medications (prescription, non prescription, or alternative), call provider or mental healthcare provider. ~ ~ Avoid or limit use of caffeine, alcohol and non-prescription drugs. Medication Advice: - Discontinue all nonprescription and alternative medications, especially stimulants, until evaluated by provider. - Take prescribed medications as directed, following label instructions for the medication. - Do not change medications or dosing regimen until provider is consulted. - Know possible side effects of medication and what to do  if they occur. - Tell provider all prescription, nonprescription or alternative medications that you take ~ 09/28/2010 8:38:15PM Page 1 of 1 [id number]

## 2010-09-29 NOTE — Telephone Encounter (Signed)
Called to speak with Kelly Conrad,  Will attempt to call again later.

## 2010-10-02 ENCOUNTER — Encounter: Payer: Self-pay | Admitting: Family Medicine

## 2010-10-03 ENCOUNTER — Encounter: Payer: Self-pay | Admitting: Family Medicine

## 2010-10-03 ENCOUNTER — Ambulatory Visit (INDEPENDENT_AMBULATORY_CARE_PROVIDER_SITE_OTHER): Payer: Medicare Other | Admitting: Family Medicine

## 2010-10-03 VITALS — BP 112/64 | HR 88 | Temp 98.2°F | Wt 152.0 lb

## 2010-10-03 DIAGNOSIS — K219 Gastro-esophageal reflux disease without esophagitis: Secondary | ICD-10-CM

## 2010-10-03 DIAGNOSIS — F0390 Unspecified dementia without behavioral disturbance: Secondary | ICD-10-CM

## 2010-10-03 DIAGNOSIS — R05 Cough: Secondary | ICD-10-CM

## 2010-10-03 DIAGNOSIS — I10 Essential (primary) hypertension: Secondary | ICD-10-CM

## 2010-10-03 MED ORDER — TRIAMTERENE-HCTZ 75-50 MG PO TABS: 1.0000 | ORAL_TABLET | Freq: Every day | ORAL | Status: DC

## 2010-10-03 MED ORDER — DONEPEZIL HCL 5 MG PO TABS: 5.0000 mg | ORAL_TABLET | Freq: Every day | ORAL | Status: DC

## 2010-10-03 MED ORDER — OMEPRAZOLE 40 MG PO CPDR: 40.0000 mg | DELAYED_RELEASE_CAPSULE | Freq: Every day | ORAL | Status: DC

## 2010-10-03 NOTE — Progress Notes (Signed)
  Subjective:    Patient ID: Kelly Conrad, female    DOB: 05-20-1931, 77 y.o.   MRN: [id number] This 77 year old white widowed is in today brought in by a friend who is concerned about her loss of memory and has recently lost her job due to this fact. She also complains of a hacking cough which she's had for some time and which I have now related to lisinopril ever she does have a deep cough at night and other than a hacking cough she has a very deep cough which may be related to gastric reflux Your we have had some concern over the last one to 2 years if she has had some dementia developing no treatment at this is increased in severity over the last 6-9 month He has no other complaints at this time but it is time for have a yearly annihilation with laboratory studies and I recommend that this be scheduled in the very near future HPI    Review of Systems see history of present illness     Objective:   Physical Exam patient appears to be developed well-nourished very pleasant and cooperative HEENT negative Lungs are clear to palpation percussion auscultation no rales no wheezing no dullness to percussion  Heart regular rhythm no murmurs  abdomen no tenderness liver spleen kidneys are nonpalpable Extremities no edema        Assessment & Plan:  My impression is that the patient is having a cough of 2 types one secondary to lisinopril which is a hacking cough and also the deeper cough which is related to esophageal reflux Hypertension is controlled On mental and psychological evaluation the patient I find that she has some evidence of early dementia feel that it would be good to start her on Aricept 5 mg initially graduated to 10 mg each day For GERD to treat her with Prilosec 40 mg daily Patient to plan physical examination and lab studies lab studies in the near future

## 2010-10-03 NOTE — Patient Instructions (Signed)
Make app for check up including fastin to do blood work on same day Go to [facility] for chest Xray Stop [facility]ril [facility]

## 2010-10-04 ENCOUNTER — Ambulatory Visit (INDEPENDENT_AMBULATORY_CARE_PROVIDER_SITE_OTHER)
Admission: RE | Admit: 2010-10-04 | Discharge: 2010-10-04 | Disposition: A | Discharge status: Home / Self Care | Payer: Medicare Other | Source: Ambulatory Visit | Attending: Family Medicine | Admitting: Family Medicine

## 2010-10-04 DIAGNOSIS — R05 Cough: Secondary | ICD-10-CM

## 2010-10-05 ENCOUNTER — Other Ambulatory Visit: Payer: Self-pay | Admitting: Family Medicine

## 2010-10-09 NOTE — Progress Notes (Signed)
Called to give pt results. Left a message for pt to return call.

## 2010-10-11 NOTE — Progress Notes (Signed)
Will discuss with pt at visit on 10/12/10

## 2010-10-12 ENCOUNTER — Ambulatory Visit: Payer: Medicare Other | Admitting: Family Medicine

## 2010-10-12 ENCOUNTER — Encounter: Payer: Self-pay | Admitting: Family Medicine

## 2010-10-12 ENCOUNTER — Ambulatory Visit (INDEPENDENT_AMBULATORY_CARE_PROVIDER_SITE_OTHER): Payer: Medicare Other | Admitting: Family Medicine

## 2010-10-12 VITALS — HR 86 | Temp 97.9°F | Ht 62.0 in | Wt 148.0 lb

## 2010-10-12 DIAGNOSIS — E785 Hyperlipidemia, unspecified: Secondary | ICD-10-CM

## 2010-10-12 DIAGNOSIS — F0390 Unspecified dementia without behavioral disturbance: Secondary | ICD-10-CM

## 2010-10-12 DIAGNOSIS — Z0000 Encounter for general adult medical examination without abnormal findings: Secondary | ICD-10-CM

## 2010-10-12 DIAGNOSIS — R05 Cough: Secondary | ICD-10-CM

## 2010-10-12 DIAGNOSIS — N390 Urinary tract infection, site not specified: Secondary | ICD-10-CM

## 2010-10-12 DIAGNOSIS — I10 Essential (primary) hypertension: Secondary | ICD-10-CM

## 2010-10-12 DIAGNOSIS — E039 Hypothyroidism, unspecified: Secondary | ICD-10-CM

## 2010-10-12 DIAGNOSIS — D649 Anemia, unspecified: Secondary | ICD-10-CM

## 2010-10-12 DIAGNOSIS — Z23 Encounter for immunization: Secondary | ICD-10-CM

## 2010-10-12 LAB — BASIC METABOLIC PANEL
BUN: 33 mg/dL — ABNORMAL HIGH (ref 6–23)
CO2: 24 mEq/L (ref 19–32)
Chloride: 102 mEq/L (ref 96–112)
Creatinine, Ser: 1.7 mg/dL — ABNORMAL HIGH (ref 0.4–1.2)
Glucose, Bld: 106 mg/dL — ABNORMAL HIGH (ref 70–99)
Potassium: 3.9 mEq/L (ref 3.5–5.1)

## 2010-10-12 LAB — CBC WITH DIFFERENTIAL/PLATELET
Eosinophils Absolute: 0.1 10*3/uL (ref 0.0–0.7)
Eosinophils Relative: 2.2 % (ref 0.0–5.0)
HCT: 38.9 % (ref 36.0–46.0)
Lymphs Abs: 1.4 10*3/uL (ref 0.7–4.0)
MCHC: 33.2 g/dL (ref 30.0–36.0)
MCV: 91.2 fl (ref 78.0–100.0)
Monocytes Absolute: 0.5 10*3/uL (ref 0.1–1.0)
Platelets: 221 10*3/uL (ref 150.0–400.0)
WBC: 6.6 10*3/uL (ref 4.5–10.5)

## 2010-10-12 LAB — HEPATIC FUNCTION PANEL
ALT: 18 U/L (ref 0–35)
Alkaline Phosphatase: 77 U/L (ref 39–117)
Bilirubin, Direct: 0 mg/dL (ref 0.0–0.3)
Total Protein: 7.1 g/dL (ref 6.0–8.3)

## 2010-10-12 LAB — POCT URINALYSIS DIPSTICK
Bilirubin, UA: NEGATIVE
Blood, UA: NEGATIVE
Ketones, UA: NEGATIVE
Nitrite, UA: NEGATIVE
Protein, UA: NEGATIVE
pH, UA: 7

## 2010-10-12 NOTE — Patient Instructions (Addendum)
Examination revels no acute problems There are signs of dementia Continue medications as prescribed Have updated your tetanus shot plus the shingles vaccine Will call lab results Chest x-ray revealed no abnormalities

## 2010-10-13 LAB — LDL CHOLESTEROL, DIRECT: Direct LDL: 145.3 mg/dL

## 2010-10-13 LAB — VITAMIN D 25 HYDROXY (VIT D DEFICIENCY, FRACTURES): Vit D, 25-Hydroxy: 28 ng/mL — ABNORMAL LOW (ref 30–89)

## 2010-10-17 ENCOUNTER — Telehealth: Payer: Self-pay

## 2010-10-17 MED ORDER — VITAMIN D3 1.25 MG (50000 UT) PO CAPS: 1.0000 | ORAL_CAPSULE | ORAL | Status: DC

## 2010-10-17 MED ORDER — CIPROFLOXACIN HCL 500 MG PO TABS: 500.0000 mg | ORAL_TABLET | Freq: Two times a day (BID) | ORAL | Status: AC

## 2010-10-18 NOTE — Progress Notes (Signed)
Per Dr. Scotty Court a letter was sent to patient about lab work.

## 2010-10-19 NOTE — Telephone Encounter (Signed)
Per Dr. Scotty Court sent a letter to pt about lab results.

## 2010-10-23 ENCOUNTER — Encounter: Payer: Self-pay | Admitting: Family Medicine

## 2010-10-23 NOTE — Progress Notes (Signed)
  Subjective:    Patient ID: Kelly Conrad, female    DOB: Aug 09, 1931, 77 y.o.   MRN: [id number] This 77 year old white widowed is in to evaluate her medical problems such as her dementia call for hypertension and GERD she also has hyperlipidemia and laboratory studies are to be done patient is not very communicative except will answer her questions to the best of her ability to ambulate she has recently lost her job possibly secondary to her dementia she relates her cough has been decreased slightly since stopping lisinopril . Pressure well controlled at 102/68 HPI    Review of Systems Seey history of present illness    Objective:   Physical Exam. The patient is a well-developed well-nourished white female in no HEENT negative has compensated ed[place]ia. Carotid pulses are good thyroid is normal Lungs clear to palpation percussion and auscultation no rales are heard no wheezing no dullness Heart no evidence of cardiomegaly heart sounds are good without murmurs peripheral pulsations are good and equal bilaterally also rhythm regular Abdomen liver spleen kidneys are nonpalpable no masses no tenderness bowel sounds normal Pelvic examination and rectal examination not done Extremities normal no edema Psychiatric evaluation reveals dementia              Assessment & Plan:  Dementia continue Avandia Aricept 10 mg daily Hypertension controlled continue triamterene hydrochlorothiazide 75-50 Call if continued use Travenol 50 mg tabs 4 times a day when necessary cough Hyperlipidemia continue Zocor GERD continue Omeprazole 40 mg daily Osteopenia continue Fosamax also calcium with vitamin D 600 days 500 mg tablets 3 times a day

## 2010-11-05 ENCOUNTER — Other Ambulatory Visit: Payer: Self-pay | Admitting: Family Medicine

## 2010-11-06 ENCOUNTER — Other Ambulatory Visit (INDEPENDENT_AMBULATORY_CARE_PROVIDER_SITE_OTHER): Payer: Medicare Other | Admitting: Family Medicine

## 2010-11-06 DIAGNOSIS — Z0000 Encounter for general adult medical examination without abnormal findings: Secondary | ICD-10-CM

## 2010-11-06 LAB — HEMOCCULT GUIAC POC 1CARD (OFFICE)
Card #2 Fecal Occult Blod, POC: NEGATIVE
Fecal Occult Blood, POC: NEGATIVE

## 2010-11-07 ENCOUNTER — Telehealth: Payer: Self-pay

## 2010-11-07 NOTE — Telephone Encounter (Signed)
Message copied by Beverely Low on Tue Nov 07, 2010 10:51 AM ------      Message from: Harvie Heck.      Created: Mon Nov 06, 2010  3:03 PM       Hemoccult x3 negative

## 2010-11-07 NOTE — Telephone Encounter (Signed)
Labs mailed

## 2010-11-18 ENCOUNTER — Other Ambulatory Visit: Payer: Self-pay | Admitting: Family Medicine

## 2010-11-22 ENCOUNTER — Ambulatory Visit (INDEPENDENT_AMBULATORY_CARE_PROVIDER_SITE_OTHER): Payer: Medicare Other | Admitting: Family Medicine

## 2010-11-22 ENCOUNTER — Encounter: Payer: Self-pay | Admitting: Family Medicine

## 2010-11-22 VITALS — BP 100/62 | HR 95 | Temp 98.5°F | Wt 144.0 lb

## 2010-11-22 DIAGNOSIS — L0233 Carbuncle of buttock: Secondary | ICD-10-CM

## 2010-11-22 DIAGNOSIS — L0232 Furuncle of buttock: Secondary | ICD-10-CM

## 2010-11-22 MED ORDER — DOXYCYCLINE HYCLATE 100 MG PO CAPS: 100.0000 mg | ORAL_CAPSULE | Freq: Two times a day (BID) | ORAL | Status: AC

## 2010-11-22 NOTE — Progress Notes (Signed)
  Subjective:    Patient ID: Kelly Conrad, female    DOB: 02-28-1932, 77 y.o.   MRN: [id number]  HPI Here with her son for several weeks of a tender spot on the right buttock. She has been washing it with peroxide daily. No fever. She says this flares up once every summer.    Review of Systems  Constitutional: Negative.   Skin: Positive for wound.       Objective:   Physical Exam  Constitutional: She appears well-developed and well-nourished.  Skin:       The right buttock has a tender red cystic lesion which appears to have already opened and drained           Assessment & Plan:  Recheck prn

## 2010-11-29 ENCOUNTER — Other Ambulatory Visit: Payer: Self-pay | Admitting: Family Medicine

## 2010-12-01 ENCOUNTER — Other Ambulatory Visit: Payer: Self-pay | Admitting: Family Medicine

## 2010-12-20 ENCOUNTER — Other Ambulatory Visit (INDEPENDENT_AMBULATORY_CARE_PROVIDER_SITE_OTHER): Payer: Medicare Other

## 2010-12-20 DIAGNOSIS — R944 Abnormal results of kidney function studies: Secondary | ICD-10-CM

## 2010-12-20 LAB — BASIC METABOLIC PANEL
BUN: 27 mg/dL — ABNORMAL HIGH (ref 6–23)
CO2: 28 mEq/L (ref 19–32)
Calcium: 9.3 mg/dL (ref 8.4–10.5)
Creatinine, Ser: 2 mg/dL — ABNORMAL HIGH (ref 0.4–1.2)
Glucose, Bld: 97 mg/dL (ref 70–99)

## 2011-01-18 ENCOUNTER — Ambulatory Visit: Payer: Medicare Other | Admitting: Family Medicine

## 2011-01-22 NOTE — Progress Notes (Signed)
Quick Note:  Spoke with pt's caregiver he is aware. ______

## 2011-01-22 NOTE — Progress Notes (Signed)
Quick Note:  Left a message for pt to return call. ______ 

## 2011-04-23 ENCOUNTER — Ambulatory Visit (INDEPENDENT_AMBULATORY_CARE_PROVIDER_SITE_OTHER): Payer: Medicare Other | Admitting: Internal Medicine

## 2011-04-23 ENCOUNTER — Encounter: Payer: Self-pay | Admitting: Internal Medicine

## 2011-04-23 VITALS — BP 120/80 | HR 80 | Ht 61.0 in | Wt 150.0 lb

## 2011-04-23 DIAGNOSIS — E785 Hyperlipidemia, unspecified: Secondary | ICD-10-CM

## 2011-04-23 DIAGNOSIS — Z79899 Other long term (current) drug therapy: Secondary | ICD-10-CM

## 2011-04-23 DIAGNOSIS — E559 Vitamin D deficiency, unspecified: Secondary | ICD-10-CM

## 2011-04-23 DIAGNOSIS — I10 Essential (primary) hypertension: Secondary | ICD-10-CM

## 2011-04-23 DIAGNOSIS — N289 Disorder of kidney and ureter, unspecified: Secondary | ICD-10-CM

## 2011-04-23 DIAGNOSIS — M810 Age-related osteoporosis without current pathological fracture: Secondary | ICD-10-CM

## 2011-04-23 DIAGNOSIS — J309 Allergic rhinitis, unspecified: Secondary | ICD-10-CM

## 2011-04-23 DIAGNOSIS — K219 Gastro-esophageal reflux disease without esophagitis: Secondary | ICD-10-CM

## 2011-04-23 DIAGNOSIS — R05 Cough: Secondary | ICD-10-CM

## 2011-04-23 DIAGNOSIS — F0390 Unspecified dementia without behavioral disturbance: Secondary | ICD-10-CM

## 2011-04-23 LAB — BASIC METABOLIC PANEL
BUN: 27 mg/dL — ABNORMAL HIGH (ref 6–23)
CO2: 30 mEq/L (ref 19–32)
Calcium: 9.6 mg/dL (ref 8.4–10.5)
Chloride: 103 mEq/L (ref 96–112)
Creatinine, Ser: 1.9 mg/dL — ABNORMAL HIGH (ref 0.4–1.2)
GFR: 26.52 mL/min — ABNORMAL LOW (ref >60.00)
Glucose, Bld: 99 mg/dL (ref 70–99)
Potassium: 4.3 mEq/L (ref 3.5–5.1)
Sodium: 141 mEq/L (ref 135–145)

## 2011-04-23 MED ORDER — DONEPEZIL HCL 10 MG PO TABS: 10.0000 mg | ORAL_TABLET | Freq: Every evening | ORAL | Status: DC | PRN

## 2011-04-23 NOTE — Patient Instructions (Addendum)
Will check in   Record about the osteoporosis medication. And send this in.  No  Change in  meds for now.  Can change to 10 mg per day.  Or Aricept.  Will notify you  of labs when available. Plan check up in the summer   Unless need to be seen sooner.

## 2011-04-23 NOTE — Progress Notes (Signed)
  Subjective:    Patient ID: Kelly Conrad, female    DOB: 06-04-1931, 78 y.o.   MRN: [id number]  HPI Patient comes in as new patient visit . Previous care was  Dr Scotty Court who has since retired . Comes in with the son who give hx mostly as best as possible. States unsure of the answers to many of the ? On the intake hx sheet. Dementia on going 2 year s,   No  hospitalization. Good appetite.  hasnt been taking fosamax   Recently   " Too much meds "? Why this was stopped  On meds fo 2 years.   Dr Scotty Court began aricept and now up to 2 5 mg per day ? If helping or not.   Son prepares food .  She helps.  Does wander and  Up at night  Dresses and bathes but son has to remind her of hyeine measures Taking prilosec as needed Review of Systems Jerking snatching  Touching around house.  No pain sob. Cough .  But no sob   No falling now no bleeding .  Vision ? Ok  Hearing old hearing aids  Affect communications  Denies numbness or  Stroke . ? If ever had a  Head scan.  Or other vascular eval.  Son diabetic eye surgeries and ampuations.   Past history family history social history reviewed  And updated in the electronic medical record.     Objective:   Physical Exam WDWN in nad  Alert  attentive  answers some  Smiling  Some hearing problem HEENT: Normocephalic ;atraumatic , Eyes;  PERRL, EOMs  Full, lids and conjunctiva clear,,Ears: no deformities, canals nl, TM landmarks normal, Nose: no deformity or discharge  Mouth : OP clear without lesion or edema .  dentures  .   Neck: Supple without adenopathy or masses or bruits Chest:  Clear to A&P without wheezes rales or rhonchi CV:  S1-S2 no gallops or murmurs peripheral perfusion is normal Neuro: oreint to person place    NOn focal by gross exam . Gait steady   No ridigity or tremor. No depressive affect.      Assessment & Plan:  Dementia  By hx  On aricept for the last ? Year or less  apparently stable in independence .  Hearing  An issues cost  an issues about hearing aids Osteoprosis   Off med since transition will check into this had hx of vit d level low.  HT stable  Cough ? Had been on ace ? Better . Although has hx of allergy and ? asthma LIPIDS: on meds  ?GERD: taking prilosec prn. Unsure why dx of hypothyroid is on the problem list .   Reviewed record and last cr was elevated for her also hx of low vit d?  Total visit > 50% spent counseling record review with son andcoordinating care

## 2011-04-29 ENCOUNTER — Encounter: Payer: Self-pay | Admitting: Internal Medicine

## 2011-04-29 DIAGNOSIS — N289 Disorder of kidney and ureter, unspecified: Secondary | ICD-10-CM | POA: Insufficient documentation

## 2011-04-29 NOTE — Assessment & Plan Note (Addendum)
Will review  record had no se reported. No swallowing dysfunction.  Will restart med if renal function is appropriate.

## 2011-05-01 NOTE — Progress Notes (Signed)
Quick Note:  Left message to call back. ______ 

## 2011-05-01 NOTE — Progress Notes (Signed)
Quick Note:  Pt's son aware of results. ______ 

## 2011-07-09 ENCOUNTER — Other Ambulatory Visit: Payer: Self-pay | Admitting: Internal Medicine

## 2011-08-04 ENCOUNTER — Other Ambulatory Visit: Payer: Self-pay | Admitting: Family Medicine

## 2011-08-07 NOTE — Telephone Encounter (Signed)
It looks like this medication was discontinued back in 04/23/2011. Pt last seen 04/23/11. Pls advise.

## 2011-08-07 NOTE — Telephone Encounter (Signed)
Please call son who is her caregiver and assess why she is on this  And needs a refill . This medication  could cause decrease in mental clarity and risk of falling so benefit would need to out weigh the risk. We don't have and active prescription for this in her records.

## 2011-08-09 NOTE — Telephone Encounter (Signed)
Called and spoke with Mr. Jackquline Bosch and he states that pt was on this medication for coughing.  Pt has had this problem for years and Dr. Scotty Court had given this medication to pt.  Pt usually takes the medication at night.  Per [name] liquid medications are not covered by [employer].  Mr. Jackquline Bosch states he has copies of Power of Attorney and Medical Power of Attorney that he will bring in to the office.  Pls advise.

## 2011-08-10 NOTE — Telephone Encounter (Signed)
Will continue same regimen for now because of prev evaluations  And doing ok.  Call for refill ans we can see how she is doing update at that time.

## 2011-08-10 NOTE — Telephone Encounter (Signed)
Per Dr. Fabian Sharp advised that tramadol is not commonly used for coughing since it is a pain medication.  Mr. Jackquline Bosch states in the past pt came in to see Dr. Scotty Court and had x-rays done and nothing could be found as to why pt has the cough.  Mr. Jackquline Bosch states he has been taking care of pt for 17 years and as long as he has pt has had this cough.  Advised [name] that pt needs to have an office visit to discuss coughing.  Mr. Jackquline Bosch states that it is difficult because he will have to pay a $40 dollar copay and they are in the process of getting pt some new hearing aids but pt needs to have her ears cleaned.  Mr. Jackquline Bosch would like to know if pt can have her ears cleaned here and that way there will only be one copay.  Mr. Jackquline Bosch states he usually takes pt to ENT to have ears cleaned.  Please advise.  Rx sent to pharmacy for Tramadol #60.

## 2011-08-17 NOTE — Telephone Encounter (Signed)
Left a message for return call.  

## 2011-09-04 ENCOUNTER — Telehealth: Payer: Self-pay | Admitting: Internal Medicine

## 2011-09-04 NOTE — Telephone Encounter (Signed)
Pt has scheduled and appt on 11/21/11 for a wellness visit. Pt poa came by and said that pt needs to get refill of  traMADol (ULTRAM) 50 MG tablet for cough. [facility] [place]  Pts POA says that this med has a lot of codeine in it and it is helping the cough. Going to need enough to last until her appt date. Pts POA is taking pt to ENT on [address] to get pts ears cleaned.

## 2011-09-04 NOTE — Telephone Encounter (Signed)
See last note  Ok to refill x 1   Given this med by dr Scotty Court to help with her cough and apparently one of the few things that works .   Refill 60 but change the sig to say tid prn cough prevention. And take hydrocodone off the list if not using this.

## 2011-09-05 ENCOUNTER — Other Ambulatory Visit: Payer: Self-pay | Admitting: Family Medicine

## 2011-09-05 MED ORDER — TRAMADOL HCL 50 MG PO TABS: 50.0000 mg | ORAL_TABLET | Freq: Three times a day (TID) | ORAL | Status: DC | PRN

## 2011-09-05 NOTE — Telephone Encounter (Signed)
Sent to the pharmacy #60 with 0 refills per [facility].  Changed to TID PRN.

## 2011-09-28 ENCOUNTER — Other Ambulatory Visit: Payer: Self-pay | Admitting: Family Medicine

## 2011-10-02 ENCOUNTER — Other Ambulatory Visit: Payer: Self-pay | Admitting: Internal Medicine

## 2011-10-03 NOTE — Telephone Encounter (Signed)
Last seen 04-23-11 and has CPE scheduled in Sept.  Please advise.  Thanks!!

## 2011-10-05 ENCOUNTER — Telehealth: Payer: Self-pay | Admitting: Family Medicine

## 2011-10-05 NOTE — Telephone Encounter (Signed)
Needs refill.  Last filled 09/05/11 #60 0 refills.  Last seen 04/23/11 and has a fu on 11-21-11.  Please advise.  Thanks!!!

## 2011-10-05 NOTE — Telephone Encounter (Signed)
Telephone note created and sent to [facility].  Waiting on response.

## 2011-10-05 NOTE — Telephone Encounter (Signed)
Can refill x 1  Has appt in September.

## 2011-10-15 ENCOUNTER — Telehealth: Payer: Self-pay | Admitting: Family Medicine

## 2011-10-15 MED ORDER — TRIAMTERENE-HCTZ 75-50 MG PO TABS: 1.0000 | ORAL_TABLET | Freq: Every day | ORAL | Status: DC

## 2011-10-15 NOTE — Telephone Encounter (Signed)
Please see below:  To: -[facility] (After Hours Triage) Fax: [phone] From: [facility] Date/ Time: 10/12/2011 7:18 PM Taken By: Jethro BolusChanetta Marshall Facility: not collected Patient: Kelly Conrad DOB: 1931/07/17 Phone: [phone] Reason for Call: Medication Issue: Patient will need refill on Maxide 75/50 [facility]. [phone]

## 2011-10-15 NOTE — Telephone Encounter (Signed)
Pt has appt on 11/21/11.  Sent by e-scribe.

## 2011-10-22 ENCOUNTER — Ambulatory Visit: Payer: Medicare Other | Admitting: Internal Medicine

## 2011-11-06 ENCOUNTER — Other Ambulatory Visit: Payer: Self-pay | Admitting: Internal Medicine

## 2011-11-17 ENCOUNTER — Other Ambulatory Visit: Payer: Self-pay | Admitting: Family Medicine

## 2011-11-21 ENCOUNTER — Ambulatory Visit (INDEPENDENT_AMBULATORY_CARE_PROVIDER_SITE_OTHER): Payer: Medicare Other | Admitting: Internal Medicine

## 2011-11-21 ENCOUNTER — Encounter: Payer: Self-pay | Admitting: Internal Medicine

## 2011-11-21 VITALS — BP 122/64 | HR 77 | Temp 97.8°F | Ht 61.0 in | Wt 149.0 lb

## 2011-11-21 DIAGNOSIS — K219 Gastro-esophageal reflux disease without esophagitis: Secondary | ICD-10-CM

## 2011-11-21 DIAGNOSIS — R259 Unspecified abnormal involuntary movements: Secondary | ICD-10-CM

## 2011-11-21 DIAGNOSIS — E559 Vitamin D deficiency, unspecified: Secondary | ICD-10-CM

## 2011-11-21 DIAGNOSIS — Z0000 Encounter for general adult medical examination without abnormal findings: Secondary | ICD-10-CM

## 2011-11-21 DIAGNOSIS — I10 Essential (primary) hypertension: Secondary | ICD-10-CM

## 2011-11-21 DIAGNOSIS — N289 Disorder of kidney and ureter, unspecified: Secondary | ICD-10-CM

## 2011-11-21 DIAGNOSIS — Z9289 Personal history of other medical treatment: Secondary | ICD-10-CM

## 2011-11-21 DIAGNOSIS — F0390 Unspecified dementia without behavioral disturbance: Secondary | ICD-10-CM

## 2011-11-21 DIAGNOSIS — E785 Hyperlipidemia, unspecified: Secondary | ICD-10-CM

## 2011-11-21 DIAGNOSIS — R05 Cough: Secondary | ICD-10-CM

## 2011-11-21 DIAGNOSIS — Z228 Carrier of other infectious diseases: Secondary | ICD-10-CM

## 2011-11-21 DIAGNOSIS — R251 Tremor, unspecified: Secondary | ICD-10-CM

## 2011-11-21 LAB — CBC WITH DIFFERENTIAL/PLATELET
Basophils Absolute: 0 10*3/uL (ref 0.0–0.1)
Basophils Relative: 0.3 % (ref 0.0–3.0)
Eosinophils Absolute: 0.1 10*3/uL (ref 0.0–0.7)
Lymphocytes Relative: 21.4 % (ref 12.0–46.0)
MCHC: 32.2 g/dL (ref 30.0–36.0)
Monocytes Relative: 7.7 % (ref 3.0–12.0)
Neutrophils Relative %: 69 % (ref 43.0–77.0)
RBC: 4.78 Mil/uL (ref 3.87–5.11)

## 2011-11-21 LAB — TSH: TSH: 1.74 u[IU]/mL (ref 0.35–5.50)

## 2011-11-21 MED ORDER — SIMVASTATIN 80 MG PO TABS: 80.0000 mg | ORAL_TABLET | Freq: Every day | ORAL | Status: DC

## 2011-11-21 MED ORDER — TRAMADOL HCL 50 MG PO TABS: 50.0000 mg | ORAL_TABLET | Freq: Every evening | ORAL | Status: DC | PRN

## 2011-11-21 MED ORDER — TRIAMTERENE-HCTZ 37.5-25 MG PO TABS: 1.0000 | ORAL_TABLET | Freq: Every day | ORAL | Status: DC

## 2011-11-21 MED ORDER — SIMVASTATIN 40 MG PO TABS: 40.0000 mg | ORAL_TABLET | Freq: Every day | ORAL | Status: DC

## 2011-11-21 NOTE — Progress Notes (Signed)
Subjective:    Patient ID: Kelly Conrad, female    DOB: Sep 11, 1931, 78 y.o.   MRN: [id number]  HPI Patient comes in today for preventive visit and follow-up of medical issues. Update  history since  last visit: Has chronic cough when lays down and has had cough med in past but basically using ultram given per dr Scotty Court and this helps her sleep and not cough  Only using at night. Denies other fogginess or se of med.  BP has been good  LIPDIS  Denies se of med   Hearing:  Good has hearing aid left ear.   Vision:  No limitations at present . Sees eye doc yearly [place]?   Safety:  Has smoke detector and wears seat belts.  No firearms. No excess sun exposure.   Falls:   Advance directive :  Reviewed  Son is hcpoa. JJ  Memory: about the same can play games adls and self care short term memory is the issue.   Depression: No anhedonia unusual crying or depressive symptoms  Nutrition: Eats well balanced diet; adequate calcium and vitamin D. No swallowing chewiing problems.  Injury: no major injuries in the last six months.  Other healthcare providers:  Reviewed today .  Social:  Lives with son . No pets.   Preventive parameters: up-to-date   ADLS:   There are no problems, feeding, obtaining food, dressing, toileting and bathing, managing money using phone.  Son drives and helps with finances .  And medication oversight.  Walks to [employer] neg tad    Review of Systems ROS:  GEN/ HEENT: No fever, significant weight changes sweats headaches vision problems hearing changes, CV/ PULM; No chest pain shortness of breath , syncope,edema  change in exercise tolerance. GI /GU: No adominal pain, vomiting, change in bowel habits. No blood in the stool. No significant GU symptoms. SKIN/HEME: ,no acute skin rashes suspicious lesions or bleeding. No lymphadenopathy, nodules, masses.  NEURO/ PSYCH:   As above  Son desribes some hand shaking when at rest but not all the time  Bothers him when he  drives some no headshaking or sleep issues . No depression anxiety. IMM/ Allergy: No unusual infections.  Allergy .   REST of 12 system review negative except as per HPI Past history family history social history reviewed in the electronic medical record.   Outpatient Encounter Prescriptions as of 11/21/2011  Medication Sig Dispense Refill  . aspirin 81 MG EC tablet Take 81 mg by mouth daily.        . Calcium Carbonate-Vitamin D (CALTRATE 600+D) 600-400 MG-UNIT per tablet Take 1 tablet by mouth 3 (three) times daily.       Marland Kitchen donepezil (ARICEPT) 10 MG tablet Take 1 tablet (10 mg total) by mouth at bedtime as needed.  30 tablet  6  . fish oil-omega-3 fatty acids 1000 MG capsule Take 2 g by mouth daily.      Marland Kitchen omeprazole (PRILOSEC) 40 MG capsule TAKE ONE CAPSULE BY MOUTH EVERY DAY  30 capsule  11  . simvastatin (ZOCOR) 80 MG tablet Take 1 tablet (80 mg total) by mouth at bedtime.  90 tablet  1  . traMADol (ULTRAM) 50 MG tablet TAKE 1 TABLET BY MOUTH 3 TIMES A DAY AS NEEDED FOR COUGH  30 tablet  0  . triamterene-hydrochlorothiazide (MAXZIDE) 75-50 MG per tablet Take 1 tablet by mouth daily. For blood pressure  30 tablet  1  . Vitamin D, Ergocalciferol, (DRISDOL) 50000 UNITS CAPS  TAKE 1 CAPSULE BY MOUTH ONCE A WEEK.  12 capsule  0  . DISCONTD: simvastatin (ZOCOR) 80 MG tablet TAKE 1 TABLET BY MOUTH AT BEDTIME  30 tablet  11       Objective:   Physical Exam BP 122/64  Pulse 77  Temp 97.8 F (36.6 C) (Oral)  Ht 5\' 1"  (1.549 m)  Wt 149 lb (67.586 kg)  BMI 28.15 kg/m2  SpO2 94% Wt Readings from Last 3 Encounters:  11/21/11 149 lb (67.586 kg)  04/23/11 150 lb (68.04 kg)  11/22/10 144 lb (65.318 kg)  Physical Exam: Vital signs reviewed UXL:KGMW is a well-developed well-nourished alert cooperative  white female who appears her stated age in no acute distress.  HEENT: normocephalic atraumatic , Eyes: PERRL EOM's full, conjunctiva clear, Nares: paten,t no deformity discharge or tenderness.,  Ears: no deformity EAC's clear TMs with normal landmarks. Left hearig aid  Mouth: clear OP, no lesions, edema.  Moist mucous membranes. Dentures NECK: supple without masses, thyromegaly or bruits. CHEST/PULM:  Clear to auscultation and percussion breath sounds equal no wheeze , rales or rhonchi. No chest wall deformities or tenderness. CV: PMI is nondisplaced, S1 S2 no gallops, murmurs, rubs. Peripheral pulses are full without delay.No JVD .  Breast: normal by inspection . No dimpling, discharge, masses, tenderness or discharge .  ABDOMEN: Bowel sounds normal nontender  No guard or rebound, no hepato splenomegal no CVA tenderness.   Extremtities:  No clubbing cyanosis or edema, no acute joint swelling or redness no focal atrophy NEURO:  Oriented to person place , cranial nerves 3-12 appear to be intact, no obvious focal weakness,gait within normal limits no abnormal reflexes or asymmetrical no clonus  No tremor noted today  SKIN: No acute rashes normal turgor, color, no bruising or petechiae. Has dystrophic toenails  Sense seems intact  PSYCH: nl speech , good eye contact, no obvious depression anxiety, LN: no cervical axillary inguinal adenopathy     Assessment & Plan:   Preventive Health Care Counseled regarding healthy nutrition, exercise, sleep, injury prevention, calcium vit d and healthy weight . Flu hsto  Questioned the dosing on her med list as poss incorrect with son.   LIPIDS suggest decrease high dose med  Uncertain why she is on high dose   It is possible that med transcribed incorrectly from EHR transition  As may have been taking 1/2 of a 80  otherwise    HT ok also hon high does diuretic and not advised    Poss was on 1/2 when drug shortage and will change back to 1 po qd of 25 37.5   Cr  A bit elevated last check will recheck    Poss gerd taking med prn   VIT D  High dose for a while adn advise plan change over to [employer] concerned about cost .  Will check level and make  plan.   Dementia   On meds  fiarly independent at this time  Son describes resting tremor  No obv cogwheeling today but will follow  For parkinsonian traits not seen today   Nocturnal cough   Unusual treatment that seem to work without  Sig se  Only take med at night and continue call for refills and recheck in 6 months   Hx of pos ppd Last x ray nad   2012 and no change in status.

## 2011-11-21 NOTE — Patient Instructions (Addendum)
Continue lifestyle intervention healthy eating and exercise . tramadol only at night   For cough  Can cause fogginess. Change simvastatin to 40 mg and lower dose fo BP pill.  Change over to 800 to 1000 iu of vit d per day  Ask pharmacist about this with calcium  Supplements.  Sometimes high  dose can be too much. Get a flu shot.  OV in 6 months for med check  .  Will notify you  of labs when available.

## 2011-11-22 LAB — LIPID PANEL
HDL: 61.7 mg/dL (ref >39.00)
VLDL: 53.6 mg/dL — ABNORMAL HIGH (ref 0.0–40.0)

## 2011-11-22 LAB — HEPATIC FUNCTION PANEL
Alkaline Phosphatase: 73 U/L (ref 39–117)
Bilirubin, Direct: 0.1 mg/dL (ref 0.0–0.3)
Total Protein: 8 g/dL (ref 6.0–8.3)

## 2011-11-22 LAB — BASIC METABOLIC PANEL
CO2: 27 mEq/L (ref 19–32)
Calcium: 9.9 mg/dL (ref 8.4–10.5)
Creatinine, Ser: 2 mg/dL — ABNORMAL HIGH (ref 0.4–1.2)
GFR: 25.71 mL/min — ABNORMAL LOW (ref >60.00)

## 2011-11-22 LAB — VITAMIN D 25 HYDROXY (VIT D DEFICIENCY, FRACTURES): Vit D, 25-Hydroxy: 44 ng/mL (ref 30–89)

## 2011-11-23 LAB — LDL CHOLESTEROL, DIRECT: Direct LDL: 127.5 mg/dL

## 2011-11-26 DIAGNOSIS — Z9289 Personal history of other medical treatment: Secondary | ICD-10-CM | POA: Insufficient documentation

## 2011-11-28 ENCOUNTER — Encounter: Payer: Self-pay | Admitting: Internal Medicine

## 2011-12-09 ENCOUNTER — Other Ambulatory Visit: Payer: Self-pay | Admitting: Internal Medicine

## 2012-01-03 ENCOUNTER — Other Ambulatory Visit: Payer: Self-pay | Admitting: Family Medicine

## 2012-01-03 ENCOUNTER — Telehealth: Payer: Self-pay | Admitting: Internal Medicine

## 2012-01-03 NOTE — Telephone Encounter (Signed)
Caller: Media planner; Patient Name: Kelly Conrad; PCP: Berniece Andreas (Family Practice); Best Callback Phone Number: [phone] Caregiver calling to request refill of Omeprazole. Pt is completely out of medication. [facility] [place].

## 2012-01-03 NOTE — Telephone Encounter (Signed)
Patient's son called stating that she need a refill of her omeprazole sent to [facility]. Please assist. Patient is completely out.

## 2012-01-04 ENCOUNTER — Other Ambulatory Visit: Payer: Self-pay | Admitting: Family Medicine

## 2012-01-04 ENCOUNTER — Other Ambulatory Visit: Payer: Self-pay | Admitting: Internal Medicine

## 2012-01-04 MED ORDER — OMEPRAZOLE 40 MG PO CPDR: 40.0000 mg | DELAYED_RELEASE_CAPSULE | Freq: Every day | ORAL | Status: DC

## 2012-01-04 NOTE — Telephone Encounter (Signed)
Sent to the pharmacy by e-scribe. 

## 2012-05-12 ENCOUNTER — Other Ambulatory Visit (INDEPENDENT_AMBULATORY_CARE_PROVIDER_SITE_OTHER): Payer: Medicare Other

## 2012-05-12 DIAGNOSIS — I10 Essential (primary) hypertension: Secondary | ICD-10-CM

## 2012-05-12 LAB — BASIC METABOLIC PANEL
CO2: 26 mEq/L (ref 19–32)
Chloride: 103 mEq/L (ref 96–112)
Potassium: 3.9 mEq/L (ref 3.5–5.1)

## 2012-05-20 ENCOUNTER — Encounter: Payer: Self-pay | Admitting: Internal Medicine

## 2012-05-20 ENCOUNTER — Ambulatory Visit (INDEPENDENT_AMBULATORY_CARE_PROVIDER_SITE_OTHER): Payer: Medicare Other | Admitting: Internal Medicine

## 2012-05-20 VITALS — BP 128/70 | HR 79 | Temp 97.8°F | Wt 148.0 lb

## 2012-05-20 DIAGNOSIS — R251 Tremor, unspecified: Secondary | ICD-10-CM

## 2012-05-20 DIAGNOSIS — R259 Unspecified abnormal involuntary movements: Secondary | ICD-10-CM

## 2012-05-20 DIAGNOSIS — I129 Hypertensive chronic kidney disease with stage 1 through stage 4 chronic kidney disease, or unspecified chronic kidney disease: Secondary | ICD-10-CM

## 2012-05-20 DIAGNOSIS — I10 Essential (primary) hypertension: Secondary | ICD-10-CM

## 2012-05-20 DIAGNOSIS — E559 Vitamin D deficiency, unspecified: Secondary | ICD-10-CM

## 2012-05-20 DIAGNOSIS — Z0000 Encounter for general adult medical examination without abnormal findings: Secondary | ICD-10-CM

## 2012-05-20 DIAGNOSIS — F0390 Unspecified dementia without behavioral disturbance: Secondary | ICD-10-CM

## 2012-05-20 DIAGNOSIS — N289 Disorder of kidney and ureter, unspecified: Secondary | ICD-10-CM

## 2012-05-20 DIAGNOSIS — E785 Hyperlipidemia, unspecified: Secondary | ICD-10-CM

## 2012-05-20 DIAGNOSIS — R05 Cough: Secondary | ICD-10-CM

## 2012-05-20 DIAGNOSIS — Z9889 Other specified postprocedural states: Secondary | ICD-10-CM

## 2012-05-20 DIAGNOSIS — K219 Gastro-esophageal reflux disease without esophagitis: Secondary | ICD-10-CM

## 2012-05-20 DIAGNOSIS — Z974 Presence of external hearing-aid: Secondary | ICD-10-CM

## 2012-05-20 MED ORDER — TRAMADOL HCL 50 MG PO TABS: 50.0000 mg | ORAL_TABLET | Freq: Every evening | ORAL | Status: DC | PRN

## 2012-05-20 NOTE — Progress Notes (Signed)
Chief Complaint  Patient presents with  . Follow-up    multiple issues meds     HPI: Patient comes in today for follow up of  multiple medical problems.  6 month check  No major change in health status since last visit . Memory a bit worse but can do adls physically if reminded  . meds given by [name]  Cough is some better but still give tramadol at night as this helps  No increase fogginess with this  ( see old notes)  Now is just on one maxide per day 25 37.5  No bleeding  obv vision hearing now has aids.   No personality change Has changes over to 2000 iu per day of vit d   ROS: See pertinent positives and negatives per HPI. No sop change in bowel habits uti sx appetite seems good   JImmys health however is getting worse  Multiple amputations no change but now may have declineing renal functions and there is discussion of dialysis.  Cr in ? 4 + range .  Past Medical History  Diagnosis Date  . Allergy   . Hypertension   . Hyperlipidemia   . GERD (gastroesophageal reflux disease)   . Asthma   . Hx of varicella   . History of positive PPD     gets c xray screen    Family History  Problem Relation Age of Onset  . Hypertension      fhx  . Diabetes Mother   . Stroke Mother   . Diabetes Father   . Edema Father     legs    History   Social History  . Marital Status: Single    Spouse Name: N/A    Number of Children: N/A  . Years of Education: N/A   Social History Main Topics  . Smoking status: Never Smoker   . Smokeless tobacco: Never Used  . Alcohol Use: No  . Drug Use: No  . Sexually Active: No   Other Topics Concern  . 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 Linard Millers    He is the main caretaker. Fabiana took care of his mom    He has some vision problems and partial amputation but  No mobiilty problems       Neg ets FA safely stored smoke alarm   No tob or etoh.    Outpatient  Encounter Prescriptions as of 05/20/2012  Medication Sig Dispense Refill  . aspirin 81 MG EC tablet Take 81 mg by mouth daily.        . Calcium Carbonate-Vitamin D (CALTRATE 600+D) 600-400 MG-UNIT per tablet Take 1 tablet by mouth 3 (three) times daily.       . Cholecalciferol (VITAMIN D3) 2000 UNITS TABS Take by mouth.      . fish oil-omega-3 fatty acids 1000 MG capsule Take 2 g by mouth daily.      . Iron 66 MG TABS Take by mouth.      Marland Kitchen omeprazole (PRILOSEC) 40 MG capsule Take 1 capsule (40 mg total) by mouth daily.  30 capsule  5  . simvastatin (ZOCOR) 40 MG tablet Take 1 tablet (40 mg total) by mouth at bedtime.  90 tablet  3  . traMADol (ULTRAM) 50 MG tablet Take 1 tablet (50 mg total) by mouth at bedtime as needed (for cough ).  30 tablet  5  .  triamterene-hydrochlorothiazide (MAXZIDE-25) 37.5-25 MG per tablet Take 1 each (1 tablet total) by mouth daily.  90 tablet  3  . vitamin B-12 (CYANOCOBALAMIN) 50 MCG tablet Take 50 mcg by mouth daily.      . [DISCONTINUED] traMADol (ULTRAM) 50 MG tablet Take 1 tablet (50 mg total) by mouth at bedtime as needed (for cough ).  30 tablet  5  . donepezil (ARICEPT) 10 MG tablet Take 1 tablet (10 mg total) by mouth at bedtime as needed.  30 tablet  6  . [DISCONTINUED] triamterene-hydrochlorothiazide (MAXZIDE) 75-50 MG per tablet TAKE 1 TABLET BY MOUTH DAILY FOR BLOOD PRESSURE  30 tablet  5  . [DISCONTINUED] Vitamin D, Ergocalciferol, (DRISDOL) 50000 UNITS CAPS TAKE 1 CAPSULE BY MOUTH ONCE A WEEK.  12 capsule  0   No facility-administered encounter medications on file as of 05/20/2012.    EXAM:  BP 128/70  Pulse 79  Temp(Src) 97.8 F (36.6 C) (Oral)  Wt 148 lb (67.132 kg)  BMI 27.98 kg/m2  SpO2 96%  Body mass index is 27.98 kg/(m^2).  GENERAL: vitals reviewed and listed above, alert, pleasant weel groomed , appears well hydrated and in no acute distress  HEENT: atraumatic, conjunctiva  clear, no obvious abnormalities on inspection of external  nose and ears OP : no lesion edema or exudate   NECK: no obvious masses on inspection palpation   LUNGS: clear to auscultation bilaterally, no wheezes, rales or rhonchi, good air movement  CV: HRRR, no clubbing cyanosis or  peripheral edema nl cap refill   MS: moves all extremities without noticeable focal  Abnormality Excellent gait   Neuro psych no focal  Not oriented to time and place  Person ok  Small verbalizations pleasant Lab Results  Component Value Date   WBC 7.5 11/21/2011   HGB 14.1 11/21/2011   HCT 43.8 11/21/2011   PLT 217.0 11/21/2011   GLUCOSE 98 05/12/2012   CHOL 232* 11/21/2011   TRIG 268.0* 11/21/2011   HDL 61.70 11/21/2011   LDLDIRECT 127.5 11/21/2011   ALT 19 11/21/2011   AST 30 11/21/2011   NA 140 05/12/2012   K 3.9 05/12/2012   CL 103 05/12/2012   CREATININE 1.8* 05/12/2012   BUN 29* 05/12/2012   CO2 26 05/12/2012   TSH 1.74 11/21/2011    ASSESSMENT AND PLAN:  Discussed the following assessment and plan:  HYPERTENSION - controlled   Dementia - Plan: traMADol (ULTRAM) 50 MG tablet  Renal insufficiency - Plan: traMADol (ULTRAM) 50 MG tablet  Medicare annual wellness visit, subsequent - Plan: traMADol (ULTRAM) 50 MG tablet  Cough nocturnal - uses ultram at night with help - Plan: traMADol (ULTRAM) 50 MG tablet  GERD - Plan: traMADol (ULTRAM) 50 MG tablet  HYPERLIPIDEMIA - tg up  on simva   healthy diet  - Plan: traMADol (ULTRAM) 50 MG tablet  VITAMIN D DEFICIENCY - has been on high dose for a while?  cost a factor  but would like to cha ge to otc if possible  check level today - Plan: traMADol (ULTRAM) 50 MG tablet  Tremor - described  in hands at rest sometimes not seen today  - Plan: traMADol (ULTRAM) 50 MG tablet  Hearing aid worn  Risk benefit of medication discussed.  limit tramadol if needed but seem to help sleep and cough  With out se  -Patient advised to return or notify health care team  if symptoms worsen or persist or new concerns arise.  Patient  Instructions  Blood pressure is good.    Kidney function is  Still down but not worse .    Continue the good care .   Continue vitamin d   Wellness visit in about 6 months or as needed     Neta Mends. Panosh M.D.

## 2012-05-20 NOTE — Patient Instructions (Addendum)
Blood pressure is good.    Kidney function is  Still down but not worse .    Continue the good care .   Continue vitamin d   Wellness visit in about 6 months or as needed

## 2012-05-22 ENCOUNTER — Encounter: Payer: Self-pay | Admitting: Internal Medicine

## 2012-05-22 DIAGNOSIS — Z974 Presence of external hearing-aid: Secondary | ICD-10-CM | POA: Insufficient documentation

## 2012-05-22 DIAGNOSIS — I129 Hypertensive chronic kidney disease with stage 1 through stage 4 chronic kidney disease, or unspecified chronic kidney disease: Secondary | ICD-10-CM | POA: Insufficient documentation

## 2012-05-22 DIAGNOSIS — R251 Tremor, unspecified: Secondary | ICD-10-CM | POA: Insufficient documentation

## 2012-06-04 ENCOUNTER — Other Ambulatory Visit: Payer: Self-pay | Admitting: Internal Medicine

## 2012-06-04 MED ORDER — DONEPEZIL HCL 10 MG PO TABS: 10.0000 mg | ORAL_TABLET | Freq: Every evening | ORAL | Status: DC | PRN

## 2012-07-07 ENCOUNTER — Other Ambulatory Visit: Payer: Self-pay | Admitting: Family Medicine

## 2012-07-07 ENCOUNTER — Other Ambulatory Visit: Payer: Self-pay | Admitting: Internal Medicine

## 2012-07-07 DIAGNOSIS — R05 Cough: Secondary | ICD-10-CM

## 2012-07-07 DIAGNOSIS — E785 Hyperlipidemia, unspecified: Secondary | ICD-10-CM

## 2012-07-07 DIAGNOSIS — E559 Vitamin D deficiency, unspecified: Secondary | ICD-10-CM

## 2012-07-07 DIAGNOSIS — K219 Gastro-esophageal reflux disease without esophagitis: Secondary | ICD-10-CM

## 2012-07-07 DIAGNOSIS — Z0000 Encounter for general adult medical examination without abnormal findings: Secondary | ICD-10-CM

## 2012-07-07 DIAGNOSIS — I10 Essential (primary) hypertension: Secondary | ICD-10-CM

## 2012-07-07 DIAGNOSIS — R251 Tremor, unspecified: Secondary | ICD-10-CM

## 2012-07-07 DIAGNOSIS — N289 Disorder of kidney and ureter, unspecified: Secondary | ICD-10-CM

## 2012-07-07 DIAGNOSIS — Z9289 Personal history of other medical treatment: Secondary | ICD-10-CM

## 2012-07-07 DIAGNOSIS — F0390 Unspecified dementia without behavioral disturbance: Secondary | ICD-10-CM

## 2012-07-07 DIAGNOSIS — R059 Cough, unspecified: Secondary | ICD-10-CM

## 2012-07-07 MED ORDER — SIMVASTATIN 40 MG PO TABS: 40.0000 mg | ORAL_TABLET | Freq: Every day | ORAL | Status: DC

## 2012-07-07 MED ORDER — TRIAMTERENE-HCTZ 37.5-25 MG PO TABS: 1.0000 | ORAL_TABLET | Freq: Every day | ORAL | Status: DC

## 2012-07-07 MED ORDER — OMEPRAZOLE 40 MG PO CPDR: 40.0000 mg | DELAYED_RELEASE_CAPSULE | Freq: Every day | ORAL | Status: DC

## 2012-07-07 MED ORDER — DONEPEZIL HCL 10 MG PO TABS: 10.0000 mg | ORAL_TABLET | Freq: Every evening | ORAL | Status: DC | PRN

## 2012-07-07 MED ORDER — TRAMADOL HCL 50 MG PO TABS: 50.0000 mg | ORAL_TABLET | Freq: Every evening | ORAL | Status: DC | PRN

## 2012-07-11 ENCOUNTER — Other Ambulatory Visit: Payer: Self-pay | Admitting: Family Medicine

## 2012-07-11 DIAGNOSIS — R251 Tremor, unspecified: Secondary | ICD-10-CM

## 2012-07-11 DIAGNOSIS — R05 Cough: Secondary | ICD-10-CM

## 2012-07-11 DIAGNOSIS — N289 Disorder of kidney and ureter, unspecified: Secondary | ICD-10-CM

## 2012-07-11 DIAGNOSIS — E785 Hyperlipidemia, unspecified: Secondary | ICD-10-CM

## 2012-07-11 DIAGNOSIS — E559 Vitamin D deficiency, unspecified: Secondary | ICD-10-CM

## 2012-07-11 DIAGNOSIS — K219 Gastro-esophageal reflux disease without esophagitis: Secondary | ICD-10-CM

## 2012-07-11 DIAGNOSIS — F0390 Unspecified dementia without behavioral disturbance: Secondary | ICD-10-CM

## 2012-07-11 DIAGNOSIS — Z0000 Encounter for general adult medical examination without abnormal findings: Secondary | ICD-10-CM

## 2012-07-11 MED ORDER — TRAMADOL HCL 50 MG PO TABS: 50.0000 mg | ORAL_TABLET | Freq: Every evening | ORAL | Status: DC | PRN

## 2012-07-18 ENCOUNTER — Telehealth: Payer: Self-pay | Admitting: Internal Medicine

## 2012-07-18 NOTE — Telephone Encounter (Signed)
Pt POA stopped by today to state that he will not be using [facility] anymore, [facility] [place]. They just wanted to ensure that all the future prescripts will be sent to the correct pharmacy. Please assist.

## 2012-07-18 NOTE — Telephone Encounter (Signed)
[facility] taken out of the system.

## 2012-11-13 ENCOUNTER — Other Ambulatory Visit: Payer: Self-pay | Admitting: Internal Medicine

## 2012-11-21 ENCOUNTER — Encounter: Payer: Medicare Other | Admitting: Internal Medicine

## 2012-11-24 ENCOUNTER — Ambulatory Visit (INDEPENDENT_AMBULATORY_CARE_PROVIDER_SITE_OTHER): Payer: Medicare Other | Admitting: Internal Medicine

## 2012-11-24 ENCOUNTER — Encounter: Payer: Self-pay | Admitting: Internal Medicine

## 2012-11-24 VITALS — BP 124/62 | HR 51 | Temp 97.5°F | Ht 61.0 in | Wt 146.0 lb

## 2012-11-24 DIAGNOSIS — I10 Essential (primary) hypertension: Secondary | ICD-10-CM

## 2012-11-24 DIAGNOSIS — I129 Hypertensive chronic kidney disease with stage 1 through stage 4 chronic kidney disease, or unspecified chronic kidney disease: Secondary | ICD-10-CM

## 2012-11-24 DIAGNOSIS — Z0000 Encounter for general adult medical examination without abnormal findings: Secondary | ICD-10-CM

## 2012-11-24 DIAGNOSIS — E785 Hyperlipidemia, unspecified: Secondary | ICD-10-CM

## 2012-11-24 DIAGNOSIS — F0390 Unspecified dementia without behavioral disturbance: Secondary | ICD-10-CM

## 2012-11-24 DIAGNOSIS — N289 Disorder of kidney and ureter, unspecified: Secondary | ICD-10-CM

## 2012-11-24 LAB — TSH: TSH: 1.21 u[IU]/mL (ref 0.35–5.50)

## 2012-11-24 LAB — BASIC METABOLIC PANEL
BUN: 27 mg/dL — ABNORMAL HIGH (ref 6–23)
Calcium: 9.4 mg/dL (ref 8.4–10.5)
GFR: 29.75 mL/min — ABNORMAL LOW (ref >60.00)
Glucose, Bld: 108 mg/dL — ABNORMAL HIGH (ref 70–99)
Sodium: 140 mEq/L (ref 135–145)

## 2012-11-24 LAB — HEPATIC FUNCTION PANEL
ALT: 14 U/L (ref 0–35)
AST: 18 U/L (ref 0–37)
Bilirubin, Direct: 0 mg/dL (ref 0.0–0.3)
Total Protein: 7.2 g/dL (ref 6.0–8.3)

## 2012-11-24 LAB — CBC WITH DIFFERENTIAL/PLATELET
Basophils Relative: 0.4 % (ref 0.0–3.0)
Eosinophils Relative: 2.3 % (ref 0.0–5.0)
HCT: 42.9 % (ref 36.0–46.0)
Lymphs Abs: 1.3 10*3/uL (ref 0.7–4.0)
MCV: 90 fl (ref 78.0–100.0)
Monocytes Absolute: 0.5 10*3/uL (ref 0.1–1.0)
Monocytes Relative: 6.1 % (ref 3.0–12.0)
Neutrophils Relative %: 74.7 % (ref 43.0–77.0)
RBC: 4.76 Mil/uL (ref 3.87–5.11)
WBC: 7.7 10*3/uL (ref 4.5–10.5)

## 2012-11-24 LAB — LIPID PANEL
Total CHOL/HDL Ratio: 4
Triglycerides: 188 mg/dL — ABNORMAL HIGH (ref 0.0–149.0)

## 2012-11-24 NOTE — Progress Notes (Signed)
Chief Complaint  Patient presents with  . Medicare Wellness    HPI: Patient comes in today for Preventive Medicare wellness visit . No major injuries, ed visits ,hospitalizations , new medications since last visit. Patient is examined by herself but her caretaker reports afterwords that she is having more disruptions at night where she has excessive motor movements. She feels like her dementia is about the same but he is on certain and may be getting worse there is some question of tremors she still goes to church doing some home chores. A Still using tramadol at night helps with the cough question sleep   Hearing:  Has hearing aids hearing better  Vision:  No limitations at present . Last eye check UTD  Safety:  Has smoke detector and wears seat belts.  No firearms. No excess sun exposure. Sees dentist regularly.  Falls:  Reports none   Advance directive :  Reviewed   Memory: Uncertain if getting worse nocturnal sx    Depression: No anhedonia unusual crying or depressive symptoms  Nutrition: Eats well balanced diet; adequate calcium and vitamin D. No swallowing chewing problems.  Injury: no major injuries in the last six months.  Other healthcare providers:  Reviewed   Social:  2 at home .?   Preventive parameters: up-to-date  Reviewed   ADLS:   There are no  need for assistance  , feeding, obtaining food, dressing, toileting and bathing, using phone.  Has dreivers lisence but not driving at this time  EXERCISE/ HABITS  Per week   No tobacco    etoh no    ROS:  GEN/ HEENT: No fever, significant weight changes sweats headaches vision problems hearing changes, CV/ PULM; No chest pain shortness of breath cough, syncope,edema  change in exercise tolerance. GI /GU: No adominal pain, vomiting, change in bowel habits. No blood in the stool. No significant GU symptoms. SKIN/HEME: ,no acute skin rashes suspicious lesions or bleeding. No lymphadenopathy, nodules, masses.  NEURO/  PSYCH:  No neurologic signs such as weakness numbness. No depression anxiety. IMM/ Allergy: No unusual infections.  Allergy .   REST of 12 system review negative except as per HPI   Past Medical History  Diagnosis Date  . Allergy   . Hypertension   . Hyperlipidemia   . GERD (gastroesophageal reflux disease)   . Asthma   . Hx of varicella   . History of positive PPD     gets c xray screen    Family History  Problem Relation Age of Onset  . Hypertension      fhx  . Diabetes Mother   . Stroke Mother   . Diabetes Father   . Edema Father     legs    History   Social History  . Marital Status: Single    Spouse Name: N/A    Number of Children: N/A  . Years of Education: N/A   Social History Main Topics  . Smoking status: Never Smoker   . Smokeless tobacco: Never Used  . Alcohol Use: No  . Drug Use: No  . Sexual Activity: No   Other Topics Concern  . 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 Linard Millers    He is the main caretaker. Kimberlie took care of his mom    He has some vision problems and partial amputation but  No mobiilty problems  Neg ets FA safely stored smoke alarm   No tob or etoh.    Outpatient Encounter Prescriptions as of 11/24/2012  Medication Sig Dispense Refill  . aspirin 81 MG EC tablet Take 81 mg by mouth daily.        . Calcium Carbonate-Vitamin D (CALTRATE 600+D) 600-400 MG-UNIT per tablet Take 1 tablet by mouth 3 (three) times daily.       Marland Kitchen donepezil (ARICEPT) 10 MG tablet Take 1 tablet (10 mg total) by mouth at bedtime as needed.  90 tablet  1  . omeprazole (PRILOSEC) 40 MG capsule Take 1 capsule (40 mg total) by mouth daily.  90 capsule  1  . simvastatin (ZOCOR) 40 MG tablet Take 1 tablet (40 mg total) by mouth at bedtime.  90 tablet  1  . traMADol (ULTRAM) 50 MG tablet Take 1 tablet (50 mg total) by mouth at bedtime as needed (for cough ).  90 tablet  1  .  triamterene-hydrochlorothiazide (MAXZIDE-25) 37.5-25 MG per tablet Take 1 tablet by mouth daily.  90 tablet  1  . vitamin B-12 (CYANOCOBALAMIN) 500 MCG tablet Take 500 mcg by mouth daily.      . [DISCONTINUED] simvastatin (ZOCOR) 40 MG tablet TAKE 1 TABLET (40 MG TOTAL) BY MOUTH AT BEDTIME.  90 tablet  0  . [DISCONTINUED] Cholecalciferol (VITAMIN D3) 2000 UNITS TABS Take by mouth.      . [DISCONTINUED] fish oil-omega-3 fatty acids 1000 MG capsule Take 2 g by mouth daily.      . [DISCONTINUED] Iron 66 MG TABS Take by mouth.      . [DISCONTINUED] vitamin B-12 (CYANOCOBALAMIN) 50 MCG tablet Take 50 mcg by mouth daily.       No facility-administered encounter medications on file as of 11/24/2012.    EXAM:  BP 124/62  Pulse 51  Temp(Src) 97.5 F (36.4 C) (Oral)  Ht 5\' 1"  (1.549 m)  Wt 146 lb (66.225 kg)  BMI 27.6 kg/m2  SpO2 98%  Body mass index is 27.6 kg/(m^2).  Physical Exam: Vital signs reviewed ZOX:WRUE is a well-developed well-nourished alert cooperative   who appears stated age in no acute distress.  HEENT: normocephalic atraumatic , Eyes: PERRL EOM's full, conjunctiva clear, Nares: paten,t no deformity discharge or tenderness., Ears: no deformity EAC's  Hearing aids  See notes  TMs with normal landmarks. Mouth: clear OP, no lesions, edema.  Moist mucous membranes. Dentition in adequate repair. NECK: supple without masses, thyromegaly or bruits. CHEST/PULM:  Clear to auscultation and percussion breath sounds equal no wheeze , rales or rhonchi. No chest wall deformities or tenderness. Breast: normal by inspection . No dimpling, discharge, masses, tenderness or discharge . CV: PMI is nondisplaced, S1 S2 no gallops, murmurs, rubs. Peripheral pulses are full without delay.No JVD .  ABDOMEN: Bowel sounds normal nontender  No guard or rebound, no hepato splenomegal no CVA tenderness.  No hernia. Extremtities:  No clubbing cyanosis or edema, no acute joint swelling or redness no focal  atrophy NEURO:  Oriented x3, cranial nerves 3-12 appear to be intact, no obvious focal weakness,gait within normal limits no abnormal reflexes or asymmetrical SKIN: No acute rashes normal turgor, color, no bruising or petechiae. PSYCH: Oriented, good eye contact, no obvious depression anxiety, cognition and judgment appear normal. LN: no cervical axillary inguinal adenopathy No noted deficits i attention, and speech. She is able to give the time and person and year but doesn't answer correctly with some questions. Her gait is steady no  obvious tremor or cogwheeling today   Lab Results  Component Value Date   WBC 7.7 11/24/2012   HGB 14.1 11/24/2012   HCT 42.9 11/24/2012   PLT 212.0 11/24/2012   GLUCOSE 108* 11/24/2012   CHOL 211* 11/24/2012   TRIG 188.0* 11/24/2012   HDL 54.50 11/24/2012   LDLDIRECT 115.6 11/24/2012   ALT 14 11/24/2012   AST 18 11/24/2012   NA 140 11/24/2012   K 5.3* 11/24/2012   CL 106 11/24/2012   CREATININE 1.8* 11/24/2012   BUN 27* 11/24/2012   CO2 28 11/24/2012   TSH 1.21 11/24/2012    ASSESSMENT AND PLAN:  Discussed the following assessment and plan:  Visit for preventive health examination  Benign hypertensive renal disease - Plan: vitamin B-12 (CYANOCOBALAMIN) 500 MCG tablet, Basic metabolic panel, CBC with Differential, Hepatic function panel, Lipid panel, TSH  HYPERLIPIDEMIA - Plan: vitamin B-12 (CYANOCOBALAMIN) 500 MCG tablet, Basic metabolic panel, CBC with Differential, Hepatic function panel, Lipid panel, TSH  Dementia - may need more supports  nocturnal trmors?  to call if wants to proceed with neuro consult ? med managment - Plan: vitamin B-12 (CYANOCOBALAMIN) 500 MCG tablet, Basic metabolic panel, CBC with Differential, Hepatic function panel, Lipid panel, TSH  Renal insufficiency - Plan: vitamin B-12 (CYANOCOBALAMIN) 500 MCG tablet, Basic metabolic panel, CBC with Differential, Hepatic function panel, Lipid panel, TSH  Medicare annual wellness visit,  subsequent - Stool cards given for colon cancer screening. - Plan: vitamin B-12 (CYANOCOBALAMIN) 500 MCG tablet, Basic metabolic panel, CBC with Differential, Hepatic function panel, Lipid panel, TSH Caretaker asks about using the nasal are for her kidney function maintenance she is not diabetic. We'll see what her labs are today. Patient Care Team: Madelin Headings, MD as PCP - General (Internal Medicine) Fredda Hammed Jeannie Fend, MD as Consulting Physician (Otolaryngology)  Patient Instructions  Continue lifestyle intervention healthy eating and exercise . Get eyes checked  . Will notify you  of labs when available. Do stool cards to check for colon cancer . Recheck in 6 months or as needed.    Neta Mends. Lexander Tremblay M.D.  Health Maintenance  Topic Date Due  . Colonoscopy  03/15/1983  . Influenza Vaccine  10/10/2013  . Tetanus/tdap  10/11/2020  . Pneumococcal Polysaccharide Vaccine Age 73 And Over  Completed  . Zostavax  Completed   Health Maintenance Review

## 2012-11-24 NOTE — Patient Instructions (Signed)
Continue lifestyle intervention healthy eating and exercise . Get eyes checked  . Will notify you  of labs when available. Do stool cards to check for colon cancer . Recheck in 6 months or as needed.

## 2012-12-02 ENCOUNTER — Telehealth: Payer: Self-pay | Admitting: Internal Medicine

## 2012-12-02 ENCOUNTER — Other Ambulatory Visit: Payer: Self-pay | Admitting: Family Medicine

## 2012-12-02 MED ORDER — HYDROCHLOROTHIAZIDE 12.5 MG PO CAPS: 12.5000 mg | ORAL_CAPSULE | Freq: Every day | ORAL | Status: DC

## 2012-12-02 NOTE — Telephone Encounter (Signed)
Mr Jackquline Bosch would like for you to call him and tell him again which medicine that pt needs to quit taking. He has also picked up the [facility] from [facility] for her to start

## 2012-12-03 NOTE — Telephone Encounter (Signed)
[name] notified to stop triamterene-hctz and start hctz alone. They stopped by the office today.

## 2012-12-17 ENCOUNTER — Other Ambulatory Visit (INDEPENDENT_AMBULATORY_CARE_PROVIDER_SITE_OTHER): Payer: Medicare Other

## 2012-12-17 DIAGNOSIS — Z1211 Encounter for screening for malignant neoplasm of colon: Secondary | ICD-10-CM

## 2012-12-17 LAB — HEMOCCULT GUIAC POC 1CARD (OFFICE): Fecal Occult Blood, POC: NEGATIVE

## 2012-12-17 NOTE — Progress Notes (Signed)
Quick Note:  Inform patient stool test negative for blood . ______ 

## 2012-12-18 ENCOUNTER — Encounter: Payer: Self-pay | Admitting: Family Medicine

## 2012-12-22 ENCOUNTER — Other Ambulatory Visit (INDEPENDENT_AMBULATORY_CARE_PROVIDER_SITE_OTHER): Payer: Medicare Other

## 2012-12-22 DIAGNOSIS — I10 Essential (primary) hypertension: Secondary | ICD-10-CM

## 2012-12-22 LAB — BASIC METABOLIC PANEL
BUN: 26 mg/dL — ABNORMAL HIGH (ref 6–23)
CO2: 27 mEq/L (ref 19–32)
Calcium: 9.4 mg/dL (ref 8.4–10.5)
Chloride: 102 mEq/L (ref 96–112)
Creatinine, Ser: 1.6 mg/dL — ABNORMAL HIGH (ref 0.4–1.2)
Glucose, Bld: 112 mg/dL — ABNORMAL HIGH (ref 70–99)

## 2012-12-29 ENCOUNTER — Other Ambulatory Visit: Payer: Self-pay | Admitting: Internal Medicine

## 2013-01-20 ENCOUNTER — Other Ambulatory Visit: Payer: Self-pay | Admitting: Internal Medicine

## 2013-01-21 NOTE — Telephone Encounter (Signed)
Last filled on 07/11/12 #90 with 1 additional refill Had CPE on 11/24/12 Has a future follow up on 05/25/13

## 2013-01-21 NOTE — Telephone Encounter (Signed)
Ok to refill 90 and  1 refill

## 2013-01-28 ENCOUNTER — Other Ambulatory Visit: Payer: Self-pay | Admitting: Internal Medicine

## 2013-04-01 ENCOUNTER — Other Ambulatory Visit: Payer: Self-pay | Admitting: Internal Medicine

## 2013-04-15 ENCOUNTER — Encounter: Payer: Self-pay | Admitting: Family Medicine

## 2013-04-15 ENCOUNTER — Ambulatory Visit (INDEPENDENT_AMBULATORY_CARE_PROVIDER_SITE_OTHER): Payer: Medicare Other | Admitting: Family Medicine

## 2013-04-15 VITALS — BP 110/60 | HR 75 | Temp 99.6°F | Wt 154.0 lb

## 2013-04-15 DIAGNOSIS — J069 Acute upper respiratory infection, unspecified: Secondary | ICD-10-CM

## 2013-04-15 DIAGNOSIS — J45909 Unspecified asthma, uncomplicated: Secondary | ICD-10-CM

## 2013-04-15 MED ORDER — ALBUTEROL SULFATE HFA 108 (90 BASE) MCG/ACT IN AERS: 2.0000 | INHALATION_SPRAY | Freq: Four times a day (QID) | RESPIRATORY_TRACT | Status: DC | PRN

## 2013-04-15 NOTE — Progress Notes (Signed)
Pre visit review using our clinic review tool, if applicable. No additional management support is needed unless otherwise documented below in the visit note. 

## 2013-04-15 NOTE — Patient Instructions (Signed)
INSTRUCTIONS FOR UPPER RESPIRATORY INFECTION:  -plenty of rest and fluids  -nasal saline wash 2-3 times daily (use prepackaged nasal saline or bottled/distilled water if making your own)   -can use sinex or afrin nasal spray for drainage and nasal congestion - but do NOT use longer then 3-4 days  -can use tylenol or ibuprofen as directed for aches and sorethroat if no problems with your liver or kidneys  -in the winter time, using a humidifier at night is helpful (please follow cleaning instructions)  -if you are taking a cough medication - use only as directed, may also try a teaspoon of honey to coat the throat and throat lozenges  -for sore throat, salt water gargles can help  -follow up if you have fevers, facial pain, tooth pain, difficulty breathing or are worsening or not getting better in 5-7 days

## 2013-04-15 NOTE — Progress Notes (Signed)
Chief Complaint  Patient presents with  . Cough    congestion, fatigue, wheezing; taking Robutussin cough and congestion, mucus reflief and fluticasone     HPI:  -started: yesterday and better today -symptoms:nasal congestion, PND, cough - son thinks he heard wheeze last night - pt denies this -denies:fever, SOB, NVD, tooth pain, body aches -has tried: musinex -sick contacts/travel/risks: denies flu exposure or Ebola risks - but people at church have been sick and son had a cold a few weeks ago -Hx of: allergies and ? Asthma but does not take anything for this  ROS: See pertinent positives and negatives per HPI.  Past Medical History  Diagnosis Date  . Allergy   . Hypertension   . Hyperlipidemia   . GERD (gastroesophageal reflux disease)   . Asthma   . Hx of varicella   . History of positive PPD     gets c xray screen    No past surgical history on file.  Family History  Problem Relation Age of Onset  . Hypertension      fhx  . Diabetes Mother   . Stroke Mother   . Diabetes Father   . Edema Father     legs    History   Social History  . Marital Status: Single    Spouse Name: N/A    Number of Children: N/A  . Years of Education: N/A   Social History Main Topics  . Smoking status: Never Smoker   . Smokeless tobacco: Never Used  . Alcohol Use: No  . Drug Use: No  . Sexual Activity: No   Other Topics Concern  . 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 [name]    He is the main caretaker. Lillia took care of his mom    He has some vision problems and partial amputation but  No mobiilty problems       Neg ets FA safely stored smoke alarm   No tob or etoh.    Current outpatient prescriptions:aspirin 81 MG EC tablet, Take 81 mg by mouth daily.  , Disp: , Rfl: ;  Calcium Carbonate-Vitamin D (CALTRATE 600+D) 600-400 MG-UNIT per tablet, Take 1 tablet by mouth 3 (three) times  daily. , Disp: , Rfl: ;  donepezil (ARICEPT) 10 MG tablet, TAKE 1 TABLET AT BEDTIME AS NEEDED, Disp: 90 tablet, Rfl: 2 hydrochlorothiazide (MICROZIDE) 12.5 MG capsule, TAKE 1 CAPSULE (12.5 MG TOTAL) BY MOUTH DAILY., Disp: 30 capsule, Rfl: 10;  omeprazole (PRILOSEC) 40 MG capsule, TAKE ONE CAPSULE BY MOUTH EVERY DAY, Disp: 90 capsule, Rfl: 2;  simvastatin (ZOCOR) 40 MG tablet, Take 1 tablet (40 mg total) by mouth at bedtime., Disp: 90 tablet, Rfl: 1;  simvastatin (ZOCOR) 40 MG tablet, TAKE 1 TABLET BY MOUTH AT BEDTIME, Disp: 90 tablet, Rfl: 2 traMADol (ULTRAM) 50 MG tablet, TAKE 1 TABLET AT BEDTIME AS NEEDED COUGH, Disp: 90 tablet, Rfl: 1;  vitamin B-12 (CYANOCOBALAMIN) 500 MCG tablet, Take 500 mcg by mouth daily., Disp: , Rfl: ;  albuterol (PROVENTIL HFA;VENTOLIN HFA) 108 (90 BASE) MCG/ACT inhaler, Inhale 2 puffs into the lungs every 6 (six) hours as needed for wheezing or shortness of breath., Disp: 1 Inhaler, Rfl: 0  EXAM:  Filed Vitals:   04/15/13 1020  BP: 110/60  Pulse: 75  Temp: 99.6 F (37.6 C)    Body mass index is 29.11 kg/(m^2).  GENERAL: vitals  reviewed and listed above, alert, oriented, appears well hydrated and in no acute distress  HEENT: atraumatic, conjunttiva clear, no obvious abnormalities on inspection of external nose and ears, normal appearance of ear canals and TMs, clear nasal congestion, mild post oropharyngeal erythema with PND, no tonsillar edema or exudate, no sinus TTP  NECK: no obvious masses on inspection  LUNGS: clear to auscultation bilaterally, no wheezes, rales or rhonchi, good air movement  CV: HRRR, no peripheral edema  MS: moves all extremities without noticeable abnormality  PSYCH: pleasant and cooperative, no obvious depression or anxiety  ASSESSMENT AND PLAN:  Discussed the following assessment and plan:  ASTHMA - Plan: albuterol (PROVENTIL HFA;VENTOLIN HFA) 108 (90 BASE) MCG/ACT inhaler  Upper respiratory infection  -given HPI and exam  findings today, a serious infection or illness is unlikely. We discussed potential etiologies, with VURI being most likely, and advised supportive care and monitoring. We discussed treatment side effects, likely course, antibiotic misuse, transmission, and signs of developing a serious illness. -she has normal lung exam and oxygen and is in no resp distress -refilled alb in case has wheezing of course if this is persistent or worsnening or getting sicker she is to follow up immediately -of course, we advised to return or notify a doctor immediately if symptoms worsen or persist or new concerns arise.    Patient Instructions  INSTRUCTIONS FOR UPPER RESPIRATORY INFECTION:  -plenty of rest and fluids  -nasal saline wash 2-3 times daily (use prepackaged nasal saline or bottled/distilled water if making your own)   -can use sinex or afrin nasal spray for drainage and nasal congestion - but do NOT use longer then 3-4 days  -can use tylenol or ibuprofen as directed for aches and sorethroat if no problems with your liver or kidneys  -in the winter time, using a humidifier at night is helpful (please follow cleaning instructions)  -if you are taking a cough medication - use only as directed, may also try a teaspoon of honey to coat the throat and throat lozenges  -for sore throat, salt water gargles can help  -follow up if you have fevers, facial pain, tooth pain, difficulty breathing or are worsening or not getting better in 5-7 days      KIM, HANNAH R.

## 2013-05-22 ENCOUNTER — Encounter: Payer: Self-pay | Admitting: *Deleted

## 2013-05-25 ENCOUNTER — Ambulatory Visit (INDEPENDENT_AMBULATORY_CARE_PROVIDER_SITE_OTHER): Payer: Medicare Other | Admitting: Internal Medicine

## 2013-05-25 ENCOUNTER — Encounter: Payer: Self-pay | Admitting: Internal Medicine

## 2013-05-25 VITALS — BP 134/60 | Temp 98.0°F | Ht 61.0 in | Wt 150.0 lb

## 2013-05-25 DIAGNOSIS — I10 Essential (primary) hypertension: Secondary | ICD-10-CM

## 2013-05-25 DIAGNOSIS — N289 Disorder of kidney and ureter, unspecified: Secondary | ICD-10-CM

## 2013-05-25 DIAGNOSIS — F0390 Unspecified dementia without behavioral disturbance: Secondary | ICD-10-CM

## 2013-05-25 LAB — BASIC METABOLIC PANEL
BUN: 29 mg/dL — ABNORMAL HIGH (ref 6–23)
CO2: 28 meq/L (ref 19–32)
Calcium: 9.7 mg/dL (ref 8.4–10.5)
Chloride: 103 mEq/L (ref 96–112)
Creatinine, Ser: 1.5 mg/dL — ABNORMAL HIGH (ref 0.4–1.2)
GFR: 35.22 mL/min — ABNORMAL LOW (ref >60.00)
GLUCOSE: 94 mg/dL (ref 70–99)
POTASSIUM: 3.8 meq/L (ref 3.5–5.1)
Sodium: 140 mEq/L (ref 135–145)

## 2013-05-25 NOTE — Progress Notes (Signed)
Chief Complaint  Patient presents with  . Follow-up    HPI: Patient comes in today for follow up of  multiple medical problems. Here with her attendant family friend caretaker helper [name]  Dementia about the same certainly not better no falling one episode of wandering  Had uri and albuterol used better  creatinine 1. 6 last visit   Fell at [employer] and no obv .   Hand motions after  In car and riding around a lot.  No rest tremor otherwise .   Eye appt tomorrow .   Caretaker   Mr [name]. insurance reimbursement issues.  Liners  Every 6 months . For his amputation  Widower. Has been under stress which does affect Ms. bennette . He takes her around with him to some of the areas to get her out. Tries to keep her moving.  Hearing  Issues. He is putting in her hearing aids most of the time  ROS: See pertinent positives and negatives per HPI.  Past Medical History  Diagnosis Date  . Allergy   . Hypertension   . Hyperlipidemia   . GERD (gastroesophageal reflux disease)   . Asthma   . Hx of varicella   . History of positive PPD     gets c xray screen    Family History  Problem Relation Age of Onset  . Hypertension      fhx  . Diabetes Mother   . Stroke Mother   . Diabetes Father   . Edema Father     legs    History   Social History  . Marital Status: Single    Spouse Name: N/A    Number of Children: N/A  . Years of Education: N/A   Social History Main Topics  . Smoking status: Never Smoker   . Smokeless tobacco: Never Used  . Alcohol Use: No  . Drug Use: No  . Sexual Activity: No   Other Topics Concern  . 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 [name]    He is the main caretaker. Tamlyn took care of his mom  Who has since passed away.   He has some vision problems and partial amputation but  No mobiilty problems       Neg ets FA safely stored smoke alarm   No  tob or etoh.    Outpatient Encounter Prescriptions as of 05/25/2013  Medication Sig  . albuterol (PROVENTIL HFA;VENTOLIN HFA) 108 (90 BASE) MCG/ACT inhaler Inhale 2 puffs into the lungs every 6 (six) hours as needed for wheezing or shortness of breath.  Marland Kitchen aspirin 81 MG EC tablet Take 81 mg by mouth daily.    . Calcium Carbonate-Vitamin D (CALTRATE 600+D) 600-400 MG-UNIT per tablet Take 1 tablet by mouth 3 (three) times daily.   Marland Kitchen donepezil (ARICEPT) 10 MG tablet TAKE 1 TABLET AT BEDTIME AS NEEDED  . hydrochlorothiazide (MICROZIDE) 12.5 MG capsule TAKE 1 CAPSULE (12.5 MG TOTAL) BY MOUTH DAILY.  . Multiple Vitamins-Minerals (CENTRUM SILVER PO) Take by mouth.  . Omega-3 Fatty Acids (FISH OIL) 1200 MG CAPS Take by mouth.  Marland Kitchen omeprazole (PRILOSEC) 40 MG capsule TAKE ONE CAPSULE BY MOUTH EVERY DAY  . simvastatin (ZOCOR) 40 MG tablet TAKE 1 TABLET BY MOUTH AT BEDTIME  . traMADol (ULTRAM) 50 MG tablet TAKE 1 TABLET AT BEDTIME AS NEEDED COUGH  . vitamin B-12 (CYANOCOBALAMIN)  500 MCG tablet Take 500 mcg by mouth daily.  . [DISCONTINUED] simvastatin (ZOCOR) 40 MG tablet Take 1 tablet (40 mg total) by mouth at bedtime.    EXAM:  BP 134/60  Temp(Src) 98 F (36.7 C) (Oral)  Ht 5\' 1"  (1.549 m)  Wt 150 lb (68.04 kg)  BMI 28.36 kg/m2  Body mass index is 28.36 kg/(m^2).  GENERAL: vitals reviewed and listed above, alert, responsive, appears well hydrated and in no acute distress smiles a lot not appear to be apprehensive. No tremor noted but occasionally tabs fingers rapidly HEENT: atraumatic, conjunctiva  clear, no obvious abnormalities on inspection of external nose and ears NECK: no obvious masses on inspection palpation  LUNGS: clear to auscultation bilaterally, no wheezes, rales or rhonchi, good air movement CV: HRRR, no clubbing cyanosis or  peripheral edema nl cap refill  MS: moves all extremities without noticeable focal  Abnormality Gait is pretty facile able to turn well spots to commands of  walking turning around and sitting down. Skin no acute bruises healing abrasion right knuckle he states is from feeding the birds which she likes to do PSYCH: pleasant and cooperative, can follow simple commands not oriented but  Place and person ok   ASSESSMENT AND PLAN:  Discussed the following assessment and plan:  HYPERTENSION - Plan: Basic metabolic panel  Dementia - Plan: Basic metabolic panel  Renal insufficiency Check labs today  Appears well cared for  Will follow  -Patient advised to return or notify health care team  if symptoms worsen ,persist or new concerns arise.  Patient Instructions  Will notify you  of labs when available. To check kidney function.   If  movements getting worse  Consider seeing neurologist .   Standley Brooking. Panosh M.D.  Pre visit review using our clinic review tool, if applicable. No additional management support is needed unless otherwise documented below in the visit note. Total visit 95mins > 50% spent counseling and coordinating care

## 2013-05-25 NOTE — Patient Instructions (Signed)
Will notify you  of labs when available. To check kidney function.   If  movements getting worse  Consider seeing neurologist .

## 2013-05-26 ENCOUNTER — Telehealth: Payer: Self-pay | Admitting: Internal Medicine

## 2013-05-26 ENCOUNTER — Encounter: Payer: Self-pay | Admitting: Internal Medicine

## 2013-05-26 NOTE — Telephone Encounter (Signed)
Relevant patient education mailed to patient.  

## 2013-07-14 ENCOUNTER — Other Ambulatory Visit: Payer: Self-pay | Admitting: Internal Medicine

## 2013-07-15 NOTE — Telephone Encounter (Signed)
Ok to refill x 6 months 

## 2013-11-16 ENCOUNTER — Other Ambulatory Visit: Payer: Self-pay | Admitting: Internal Medicine

## 2013-11-17 ENCOUNTER — Telehealth: Payer: Self-pay | Admitting: Family Medicine

## 2013-11-17 NOTE — Telephone Encounter (Signed)
This patient is due for her yearly wellness exam in the next 90 days per [facility].  Please make the appt with the pt and remind her to come fasting for lab work. Thanks!

## 2013-11-17 NOTE — Telephone Encounter (Signed)
Sent to the pharmacy by e-scribe.  Will send a message to the front desk to have the pt scheduled for yearly wellness.

## 2013-11-17 NOTE — Telephone Encounter (Signed)
Due for lipid this month.

## 2013-11-17 NOTE — Telephone Encounter (Signed)
Ok x  90 days  Due for  Yearly labs and visit    Before runs out of meds

## 2013-11-18 NOTE — Telephone Encounter (Signed)
Pt has been sch

## 2013-11-18 NOTE — Telephone Encounter (Signed)
lmom for pt to cb

## 2013-12-14 ENCOUNTER — Other Ambulatory Visit: Payer: Self-pay | Admitting: Internal Medicine

## 2013-12-14 NOTE — Telephone Encounter (Signed)
Pt has appt scheduled on 03/03/14.  Sent to the pharmacy for 90 days.

## 2014-01-17 ENCOUNTER — Other Ambulatory Visit: Payer: Self-pay | Admitting: Internal Medicine

## 2014-01-18 NOTE — Telephone Encounter (Signed)
Sent to the pharmacy by e-scribe.  Pt has a CPX scheduled for 03/03/14

## 2014-02-16 ENCOUNTER — Other Ambulatory Visit: Payer: Self-pay | Admitting: Internal Medicine

## 2014-02-18 NOTE — Telephone Encounter (Signed)
Called to the pharmacy and left on machine. 

## 2014-02-18 NOTE — Telephone Encounter (Signed)
Call in #30 with no rf  

## 2014-02-24 ENCOUNTER — Other Ambulatory Visit: Payer: Self-pay | Admitting: Internal Medicine

## 2014-02-25 NOTE — Telephone Encounter (Signed)
Sent to the pharmacy by e-scribe.  Pt has upcoming wellness exam on 03/03/14

## 2014-03-02 ENCOUNTER — Encounter: Payer: Self-pay | Admitting: Internal Medicine

## 2014-03-02 ENCOUNTER — Ambulatory Visit (INDEPENDENT_AMBULATORY_CARE_PROVIDER_SITE_OTHER): Payer: Medicare Other | Admitting: Internal Medicine

## 2014-03-02 VITALS — BP 128/66 | Temp 97.5°F | Ht 60.5 in | Wt 156.1 lb

## 2014-03-02 DIAGNOSIS — Z0000 Encounter for general adult medical examination without abnormal findings: Secondary | ICD-10-CM

## 2014-03-02 DIAGNOSIS — Z23 Encounter for immunization: Secondary | ICD-10-CM

## 2014-03-02 DIAGNOSIS — E785 Hyperlipidemia, unspecified: Secondary | ICD-10-CM

## 2014-03-02 DIAGNOSIS — N289 Disorder of kidney and ureter, unspecified: Secondary | ICD-10-CM

## 2014-03-02 DIAGNOSIS — F0390 Unspecified dementia without behavioral disturbance: Secondary | ICD-10-CM

## 2014-03-02 DIAGNOSIS — I129 Hypertensive chronic kidney disease with stage 1 through stage 4 chronic kidney disease, or unspecified chronic kidney disease: Secondary | ICD-10-CM

## 2014-03-02 LAB — LIPID PANEL
CHOL/HDL RATIO: 4
Cholesterol: 199 mg/dL (ref 0–200)
HDL: 52.3 mg/dL (ref >39.00)
LDL CALC: 112 mg/dL — AB (ref 0–99)
NONHDL: 146.7
TRIGLYCERIDES: 176 mg/dL — AB (ref 0.0–149.0)
VLDL: 35.2 mg/dL (ref 0.0–40.0)

## 2014-03-02 LAB — CBC WITH DIFFERENTIAL/PLATELET
Basophils Absolute: 0 10*3/uL (ref 0.0–0.1)
Basophils Relative: 0.2 % (ref 0.0–3.0)
EOS ABS: 0.2 10*3/uL (ref 0.0–0.7)
Eosinophils Relative: 3 % (ref 0.0–5.0)
HCT: 44.9 % (ref 36.0–46.0)
Hemoglobin: 14.7 g/dL (ref 12.0–15.0)
Lymphocytes Relative: 21.9 % (ref 12.0–46.0)
Lymphs Abs: 1.5 10*3/uL (ref 0.7–4.0)
MCHC: 32.8 g/dL (ref 30.0–36.0)
MCV: 89.6 fl (ref 78.0–100.0)
Monocytes Absolute: 0.6 10*3/uL (ref 0.1–1.0)
Monocytes Relative: 8 % (ref 3.0–12.0)
NEUTROS PCT: 66.9 % (ref 43.0–77.0)
Neutro Abs: 4.6 10*3/uL (ref 1.4–7.7)
PLATELETS: 208 10*3/uL (ref 150.0–400.0)
RBC: 5.01 Mil/uL (ref 3.87–5.11)
RDW: 13.8 % (ref 11.5–15.5)
WBC: 6.9 10*3/uL (ref 4.0–10.5)

## 2014-03-02 LAB — BASIC METABOLIC PANEL
BUN: 26 mg/dL — ABNORMAL HIGH (ref 6–23)
CHLORIDE: 103 meq/L (ref 96–112)
CO2: 28 meq/L (ref 19–32)
Calcium: 9.4 mg/dL (ref 8.4–10.5)
Creatinine, Ser: 1.5 mg/dL — ABNORMAL HIGH (ref 0.4–1.2)
GFR: 36.84 mL/min — ABNORMAL LOW (ref >60.00)
GLUCOSE: 109 mg/dL — AB (ref 70–99)
Potassium: 4.5 mEq/L (ref 3.5–5.1)
SODIUM: 140 meq/L (ref 135–145)

## 2014-03-02 LAB — TSH: TSH: 2.46 u[IU]/mL (ref 0.35–4.50)

## 2014-03-02 MED ORDER — TRAMADOL HCL 50 MG PO TABS: 50.0000 mg | ORAL_TABLET | Freq: Every evening | ORAL | Status: DC | PRN

## 2014-03-02 NOTE — Progress Notes (Signed)
Chief Complaint  Patient presents with  . Medicare Wellness  . Hypertension  . Dementia  . Hyperlipidemia    HPI: Patient comes in today for Preventive Medicare wellness visit . With her legal guardian  Caretaker  Since   Has dementia ht disease elevaetd lipids   And nocturnal cough controlled with ultram . Goes out attended  Takes own medicine .  Bathes after cued to do so . Has repetitive motion with scratching and night time  Up . No injury  Seeing dr Allyson Sabal. For skin areas  Caretaker  Had amputation this year .  uncertain how long he can continue to caretake . No obv cp sob cv sx . HT taking meds not checking  No syncope falling   Health Maintenance  Topic Date Due  . COLONOSCOPY  03/15/1983  . INFLUENZA VACCINE  10/11/2014  . TETANUS/TDAP  10/11/2020  . DEXA SCAN  Completed  . PNEUMOCOCCAL POLYSACCHARIDE VACCINE AGE 74 AND OVER  Completed  . ZOSTAVAX  Completed   Health Maintenance Review   Hearing: had aids  Hard to tell when batteries out   Vision:  No limitations at present .noted  Last eye check UTD  Safety:  Has smoke detector and wears seat belts.  No firearms. No excess sun exposure. Falls:   Advance directive :   hcpoa   Memory: tremor and dementia   Depression: No anhedonia unusual crying or depressive symptoms obious but does get irritated at times Nutrition: Eats well balanced diet; adequate calcium and vitamin D. No swallowing chewing problems.  Injury: no major injuries in the last six months.  Other healthcare providers:  Reviewed today .  Social:  Lives with guardian   Preventive parameters: up-to-date  Reviewed   ADLS:   There is need for assistance  driving,  obtaining food, self cues  For selfdressing, toileting and bathing, needs help managing money using phone.  Motor skills are good  Cognition not  ROS:  GEN/ HEENT: No fever, significant weight changes sweats headaches vision problems  CV/ PULM; No chest pain shortness of breath  cough, syncope,edema  change in exercise tolerance. GI /GU: No adominal pain, vomiting, change in bowel habits. No blood in the stool. No significant GU symptoms. SKIN/HEME: ,no acute skin rashes suspicious lesions or bleeding. No lymphadenopathy, nodules, masses.  NEURO/ PSYCH:  No new neurologic signs such as weakness numbness. No depression anxiety.dementia  above  IMM/ Allergy: No unusual infections.  Allergy .   REST of 12 system review negative except as per HPI   Past Medical History  Diagnosis Date  . Allergy   . Hypertension   . Hyperlipidemia   . GERD (gastroesophageal reflux disease)   . Asthma   . Hx of varicella   . History of positive PPD     gets c xray screen    Family History  Problem Relation Age of Onset  . Hypertension      fhx  . Diabetes Mother   . Stroke Mother   . Diabetes Father   . Edema Father     legs    History   Social History  . Marital Status: Single    Spouse Name: N/A    Number of Children: N/A  . Years of Education: N/A   Social History Main Topics  . Smoking status: Never Smoker   . Smokeless tobacco: Never Used  . Alcohol Use: No  . Drug Use: No  . Sexual Activity: No  Other Topics Concern  . 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 [name]    He is the main caretaker. Michelene took care of his mom  Who has since passed away.   He has some vision problems and partial amputation but  No mobiilty problems       Neg ets FA safely stored smoke alarm   No tob or etoh.    Outpatient Encounter Prescriptions as of 03/02/2014  Medication Sig  . aspirin 81 MG EC tablet Take 81 mg by mouth daily.    . Calcium Carb-Cholecalciferol (CALCIUM+D3) 600-800 MG-UNIT TABS Take by mouth.  . donepezil (ARICEPT) 10 MG tablet TAKE 1 TABLET AT BEDTIME AS NEEDED  . hydrochlorothiazide (MICROZIDE) 12.5 MG capsule TAKE 1 CAPSULE (12.5 MG TOTAL) BY MOUTH DAILY.  .  MULTIPLE VITAMIN PO Take 1 tablet by mouth daily.  . Multiple Vitamins-Minerals (CENTRUM SILVER PO) Take by mouth.  Marland Kitchen omeprazole (PRILOSEC) 40 MG capsule TAKE ONE CAPSULE BY MOUTH EVERY DAY  . simvastatin (ZOCOR) 40 MG tablet TAKE 1 TABLET BY MOUTH AT BEDTIME  . traMADol (ULTRAM) 50 MG tablet Take 1 tablet (50 mg total) by mouth at bedtime as needed.  . [DISCONTINUED] traMADol (ULTRAM) 50 MG tablet TAKE 1 TABLET BY MOUTH AT BEDTIME AS NEEDED  . [DISCONTINUED] albuterol (PROVENTIL HFA;VENTOLIN HFA) 108 (90 BASE) MCG/ACT inhaler Inhale 2 puffs into the lungs every 6 (six) hours as needed for wheezing or shortness of breath.  . [DISCONTINUED] Calcium Carbonate-Vitamin D (CALTRATE 600+D) 600-400 MG-UNIT per tablet Take 1 tablet by mouth 3 (three) times daily.   . [DISCONTINUED] Omega-3 Fatty Acids (FISH OIL) 1200 MG CAPS Take by mouth.  . [DISCONTINUED] vitamin B-12 (CYANOCOBALAMIN) 500 MCG tablet Take 500 mcg by mouth daily.    EXAM:  BP 128/66 mmHg  Temp(Src) 97.5 F (36.4 C) (Oral)  Ht 5' 0.5" (1.537 m)  Wt 156 lb 1.6 oz (70.806 kg)  BMI 29.97 kg/m2  Body mass index is 29.97 kg/(m^2).  Physical Exam: Vital signs reviewed JTT:SVXB is a well-developed well-nourished alert cooperative smiles   who appears stated age in no acute distress.  HEENT: normocephalic atraumatic , Eyes: PERRL EOM's full, conjunctiva clear, Nares: paten,t no deformity discharge or tenderness., Ears: no deformity EAC's hearing aids  TMs with normal landmarks. Mouth: clear OP, no lesions,dentures  edema.  Moist mucous membranes.  NECK: supple without masses, thyromegaly or bruits. CHEST/PULM:  Clear to auscultation and percussion breath sounds equal no wheeze , rales or rhonchi. No chest wall deformities or tenderness. CV: PMI is nondisplaced, S1 S2 no gallops, murmurs, rubs. Peripheral pulses are full without delay.No JVD .  ABDOMEN: Bowel sounds normal nontender  No guard or rebound, no hepato splenomegal no CVA  tenderness.  No hernia. Extremtities:  No clubbing cyanosis or edema, no acute joint swelling or redness no focal atrophy NEURO:  Pleasant not orietned speech nl  cranial nerves 3-12 appear to be intact,x hearing no obvious focal weakness,gait within normal limits no abnormal reflexes or asymmetrical good balance  SKIN: No acute rashes normal turgor, color, no bruising or petechiae. Scaling on face areas  PSYCH:  good eye contact, no obvious depression anxiety,. LN: no cervical axillary inguinal adenopathy No noted deficits in attention, and speech.  ASSESSMENT AND PLAN:  Discussed the following assessment and plan:  Visit for preventive health examination - Plan: Basic metabolic  panel, Lipid panel, TSH, CBC with Differential  Dementia, without behavioral disturbance - counseled - Plan: Basic metabolic panel, Lipid panel, TSH, CBC with Differential  Renal insufficiency - Plan: Basic metabolic panel, Lipid panel, TSH, CBC with Differential  Benign hypertensive renal disease  Hyperlipidemia  Need for vaccination with 13-polyvalent pneumococcal conjugate vaccine - Plan: Pneumococcal conjugate vaccine 13-valent IM Spend a good deal of time regarding  caretaking possibilities    Agree that  A responsible person should be with her if she goes on church trips . because of her cognitive status.  Monitor for low bp  And can dec med if needed .   Check monitoring labs today   And void renotoxic agents   Disc  Monitoring to avoid medication misshaps  Patient Care Team: Burnis Medin, MD as PCP - General (Internal Medicine) Kathrin Penner Jacelyn Grip, MD as Consulting Physician (Otolaryngology) Simona Huh, MD as Consulting Physician (Dermatology)  Patient Instructions  Continue to look into  Options     For adult day care . Use pill box  And double check to make sure meds taken correctly .  ROV in 6 months or as needed. hav e pharmacy call for refills .  Contnue  yearly flu vaccine . Safety prevention.   Standley Brooking. Dreonna Hussein M.D.  Lab Results  Component Value Date   WBC 6.9 03/02/2014   HGB 14.7 03/02/2014   HCT 44.9 03/02/2014   PLT 208.0 03/02/2014   GLUCOSE 109* 03/02/2014   CHOL 199 03/02/2014   TRIG 176.0* 03/02/2014   HDL 52.30 03/02/2014   LDLDIRECT 115.6 11/24/2012   LDLCALC 112* 03/02/2014   ALT 14 11/24/2012   AST 18 11/24/2012   NA 140 03/02/2014   K 4.5 03/02/2014   CL 103 03/02/2014   CREATININE 1.5* 03/02/2014   BUN 26* 03/02/2014   CO2 28 03/02/2014   TSH 2.46 03/02/2014

## 2014-03-02 NOTE — Patient Instructions (Addendum)
Continue to look into  Options     For adult day care . Use pill box  And double check to make sure meds taken correctly .  ROV in 6 months or as needed. hav e pharmacy call for refills .  Contnue yearly flu vaccine . Safety prevention.

## 2014-03-02 NOTE — Progress Notes (Signed)
Pre visit review using our clinic review tool, if applicable. No additional management support is needed unless otherwise documented below in the visit note. 

## 2014-03-03 ENCOUNTER — Encounter: Payer: Medicare Other | Admitting: Internal Medicine

## 2014-03-10 ENCOUNTER — Other Ambulatory Visit: Payer: Self-pay | Admitting: Internal Medicine

## 2014-03-10 NOTE — Telephone Encounter (Signed)
Sent to the pharmacy by e-scribe.  Pt should return in 6 months

## 2014-03-10 NOTE — Telephone Encounter (Signed)
Sent to the pharmacy by e-scribe. 

## 2014-03-24 ENCOUNTER — Other Ambulatory Visit: Payer: Self-pay | Admitting: Family Medicine

## 2014-03-25 ENCOUNTER — Telehealth: Payer: Self-pay | Admitting: Internal Medicine

## 2014-03-25 NOTE — Telephone Encounter (Signed)
Kelly Conrad came in to say that pt no longer needs omeprazole (PRILOSEC) 40 MG capsule. Kelly Conrad said will not be picking this rx up at the pharmacy.

## 2014-03-25 NOTE — Telephone Encounter (Signed)
Noted  

## 2014-04-14 ENCOUNTER — Other Ambulatory Visit: Payer: Self-pay | Admitting: Internal Medicine

## 2014-04-14 NOTE — Telephone Encounter (Signed)
Filled for 1 year on 03/10/14.  Denied.

## 2014-05-14 ENCOUNTER — Other Ambulatory Visit: Payer: Self-pay

## 2014-05-14 DIAGNOSIS — C44311 Basal cell carcinoma of skin of nose: Secondary | ICD-10-CM | POA: Diagnosis not present

## 2014-05-14 DIAGNOSIS — D485 Neoplasm of uncertain behavior of skin: Secondary | ICD-10-CM | POA: Diagnosis not present

## 2014-05-14 DIAGNOSIS — L570 Actinic keratosis: Secondary | ICD-10-CM | POA: Diagnosis not present

## 2014-05-25 ENCOUNTER — Other Ambulatory Visit: Payer: Self-pay | Admitting: Internal Medicine

## 2014-05-26 NOTE — Telephone Encounter (Signed)
Sent to the pharmacy by e-scribe. 

## 2014-05-27 DIAGNOSIS — L570 Actinic keratosis: Secondary | ICD-10-CM | POA: Diagnosis not present

## 2014-05-31 ENCOUNTER — Other Ambulatory Visit: Payer: Self-pay | Admitting: Internal Medicine

## 2014-05-31 NOTE — Telephone Encounter (Signed)
Sent to the pharmacy by e-scribe. 

## 2014-06-01 DIAGNOSIS — H2513 Age-related nuclear cataract, bilateral: Secondary | ICD-10-CM | POA: Diagnosis not present

## 2014-06-01 DIAGNOSIS — H25043 Posterior subcapsular polar age-related cataract, bilateral: Secondary | ICD-10-CM | POA: Diagnosis not present

## 2014-06-01 DIAGNOSIS — H25013 Cortical age-related cataract, bilateral: Secondary | ICD-10-CM | POA: Diagnosis not present

## 2014-06-28 ENCOUNTER — Encounter (HOSPITAL_COMMUNITY): Payer: Self-pay | Admitting: Adult Health

## 2014-06-28 ENCOUNTER — Inpatient Hospital Stay (HOSPITAL_COMMUNITY)
Admission: EM | Admit: 2014-06-28 | Discharge: 2014-07-04 | DRG: 871 | Disposition: A | Discharge status: Home Health | Payer: Medicare Other | Attending: Internal Medicine | Admitting: Internal Medicine

## 2014-06-28 ENCOUNTER — Emergency Department (HOSPITAL_COMMUNITY): Payer: Medicare Other

## 2014-06-28 DIAGNOSIS — Z7982 Long term (current) use of aspirin: Secondary | ICD-10-CM | POA: Diagnosis not present

## 2014-06-28 DIAGNOSIS — N183 Chronic kidney disease, stage 3 (moderate): Secondary | ICD-10-CM | POA: Diagnosis present

## 2014-06-28 DIAGNOSIS — R0602 Shortness of breath: Secondary | ICD-10-CM | POA: Diagnosis not present

## 2014-06-28 DIAGNOSIS — K219 Gastro-esophageal reflux disease without esophagitis: Secondary | ICD-10-CM | POA: Diagnosis present

## 2014-06-28 DIAGNOSIS — M810 Age-related osteoporosis without current pathological fracture: Secondary | ICD-10-CM | POA: Diagnosis present

## 2014-06-28 DIAGNOSIS — F0390 Unspecified dementia without behavioral disturbance: Secondary | ICD-10-CM | POA: Diagnosis not present

## 2014-06-28 DIAGNOSIS — J14 Pneumonia due to Hemophilus influenzae: Secondary | ICD-10-CM | POA: Diagnosis present

## 2014-06-28 DIAGNOSIS — Z888 Allergy status to other drugs, medicaments and biological substances status: Secondary | ICD-10-CM

## 2014-06-28 DIAGNOSIS — I10 Essential (primary) hypertension: Secondary | ICD-10-CM | POA: Diagnosis present

## 2014-06-28 DIAGNOSIS — A419 Sepsis, unspecified organism: Principal | ICD-10-CM | POA: Diagnosis present

## 2014-06-28 DIAGNOSIS — R739 Hyperglycemia, unspecified: Secondary | ICD-10-CM | POA: Diagnosis not present

## 2014-06-28 DIAGNOSIS — J189 Pneumonia, unspecified organism: Secondary | ICD-10-CM | POA: Diagnosis not present

## 2014-06-28 DIAGNOSIS — J9811 Atelectasis: Secondary | ICD-10-CM | POA: Diagnosis present

## 2014-06-28 DIAGNOSIS — R7611 Nonspecific reaction to tuberculin skin test without active tuberculosis: Secondary | ICD-10-CM | POA: Diagnosis present

## 2014-06-28 DIAGNOSIS — J9601 Acute respiratory failure with hypoxia: Secondary | ICD-10-CM | POA: Diagnosis not present

## 2014-06-28 DIAGNOSIS — N179 Acute kidney failure, unspecified: Secondary | ICD-10-CM | POA: Diagnosis present

## 2014-06-28 DIAGNOSIS — Z79899 Other long term (current) drug therapy: Secondary | ICD-10-CM

## 2014-06-28 DIAGNOSIS — R0609 Other forms of dyspnea: Secondary | ICD-10-CM

## 2014-06-28 DIAGNOSIS — Z66 Do not resuscitate: Secondary | ICD-10-CM | POA: Diagnosis not present

## 2014-06-28 DIAGNOSIS — R0989 Other specified symptoms and signs involving the circulatory and respiratory systems: Secondary | ICD-10-CM | POA: Diagnosis not present

## 2014-06-28 DIAGNOSIS — R05 Cough: Secondary | ICD-10-CM | POA: Diagnosis not present

## 2014-06-28 DIAGNOSIS — E785 Hyperlipidemia, unspecified: Secondary | ICD-10-CM | POA: Diagnosis not present

## 2014-06-28 DIAGNOSIS — I129 Hypertensive chronic kidney disease with stage 1 through stage 4 chronic kidney disease, or unspecified chronic kidney disease: Secondary | ICD-10-CM | POA: Diagnosis not present

## 2014-06-28 DIAGNOSIS — I472 Ventricular tachycardia: Secondary | ICD-10-CM | POA: Diagnosis not present

## 2014-06-28 DIAGNOSIS — J9600 Acute respiratory failure, unspecified whether with hypoxia or hypercapnia: Secondary | ICD-10-CM | POA: Insufficient documentation

## 2014-06-28 DIAGNOSIS — R059 Cough, unspecified: Secondary | ICD-10-CM

## 2014-06-28 DIAGNOSIS — I5031 Acute diastolic (congestive) heart failure: Secondary | ICD-10-CM | POA: Diagnosis not present

## 2014-06-28 DIAGNOSIS — J45909 Unspecified asthma, uncomplicated: Secondary | ICD-10-CM | POA: Diagnosis not present

## 2014-06-28 DIAGNOSIS — IMO0001 Reserved for inherently not codable concepts without codable children: Secondary | ICD-10-CM | POA: Insufficient documentation

## 2014-06-28 HISTORY — DX: Unspecified dementia, unspecified severity, without behavioral disturbance, psychotic disturbance, mood disturbance, and anxiety: F03.90

## 2014-06-28 LAB — URINALYSIS, ROUTINE W REFLEX MICROSCOPIC
Bilirubin Urine: NEGATIVE
Glucose, UA: NEGATIVE mg/dL
Hgb urine dipstick: NEGATIVE
Ketones, ur: NEGATIVE mg/dL
LEUKOCYTES UA: NEGATIVE
NITRITE: NEGATIVE
PH: 6 (ref 5.0–8.0)
Protein, ur: NEGATIVE mg/dL
SPECIFIC GRAVITY, URINE: 1.001 — AB (ref 1.005–1.030)
Urobilinogen, UA: 0.2 mg/dL (ref 0.0–1.0)

## 2014-06-28 LAB — COMPREHENSIVE METABOLIC PANEL
ALK PHOS: 102 U/L (ref 39–117)
ALT: 33 U/L (ref 0–35)
AST: 50 U/L — ABNORMAL HIGH (ref 0–37)
Albumin: 3.3 g/dL — ABNORMAL LOW (ref 3.5–5.2)
Anion gap: 13 (ref 5–15)
BILIRUBIN TOTAL: 1.3 mg/dL — AB (ref 0.3–1.2)
BUN: 24 mg/dL — ABNORMAL HIGH (ref 6–23)
CO2: 25 mmol/L (ref 19–32)
Calcium: 8.6 mg/dL (ref 8.4–10.5)
Chloride: 98 mmol/L (ref 96–112)
Creatinine, Ser: 1.9 mg/dL — ABNORMAL HIGH (ref 0.50–1.10)
GFR calc non Af Amer: 24 mL/min — ABNORMAL LOW (ref >90)
GFR, EST AFRICAN AMERICAN: 27 mL/min — AB (ref >90)
GLUCOSE: 168 mg/dL — AB (ref 70–99)
Potassium: 3.6 mmol/L (ref 3.5–5.1)
Sodium: 136 mmol/L (ref 135–145)
TOTAL PROTEIN: 7.3 g/dL (ref 6.0–8.3)

## 2014-06-28 LAB — CBC WITH DIFFERENTIAL/PLATELET
Basophils Absolute: 0 10*3/uL (ref 0.0–0.1)
Basophils Relative: 0 % (ref 0–1)
EOS ABS: 0 10*3/uL (ref 0.0–0.7)
Eosinophils Relative: 0 % (ref 0–5)
HCT: 41.9 % (ref 36.0–46.0)
Hemoglobin: 14 g/dL (ref 12.0–15.0)
LYMPHS ABS: 0.6 10*3/uL — AB (ref 0.7–4.0)
Lymphocytes Relative: 5 % — ABNORMAL LOW (ref 12–46)
MCH: 29.4 pg (ref 26.0–34.0)
MCHC: 33.4 g/dL (ref 30.0–36.0)
MCV: 87.8 fL (ref 78.0–100.0)
Monocytes Absolute: 0.9 10*3/uL (ref 0.1–1.0)
Monocytes Relative: 7 % (ref 3–12)
Neutro Abs: 11.7 10*3/uL — ABNORMAL HIGH (ref 1.7–7.7)
Neutrophils Relative %: 88 % — ABNORMAL HIGH (ref 43–77)
Platelets: 192 10*3/uL (ref 150–400)
RBC: 4.77 MIL/uL (ref 3.87–5.11)
RDW: 13.6 % (ref 11.5–15.5)
WBC: 13.2 10*3/uL — AB (ref 4.0–10.5)

## 2014-06-28 LAB — I-STAT CG4 LACTIC ACID, ED: Lactic Acid, Venous: 3 mmol/L (ref 0.5–2.0)

## 2014-06-28 MED ORDER — DEXTROSE 5 % IV SOLN: 500.0000 mg | Freq: Once | INTRAVENOUS | Status: DC

## 2014-06-28 MED ORDER — AZITHROMYCIN 500 MG IV SOLR
500.0000 mg | INTRAVENOUS | Status: DC
  Administered 2014-06-28: 500 mg via INTRAVENOUS
  Filled 2014-06-28: qty 500

## 2014-06-28 MED ORDER — SODIUM CHLORIDE 0.9 % IV BOLUS (SEPSIS)
500.0000 mL | INTRAVENOUS | Status: AC
  Administered 2014-06-28: 500 mL via INTRAVENOUS

## 2014-06-28 MED ORDER — ACETAMINOPHEN 500 MG PO TABS: 1000.0000 mg | ORAL_TABLET | Freq: Once | ORAL | Status: DC

## 2014-06-28 MED ORDER — CEFTRIAXONE SODIUM 1 G IJ SOLR: 1.0000 g | Freq: Once | INTRAMUSCULAR | Status: DC

## 2014-06-28 MED ORDER — ACETAMINOPHEN 325 MG PO TABS
650.0000 mg | ORAL_TABLET | Freq: Once | ORAL | Status: AC
  Administered 2014-06-28: 650 mg via ORAL
  Filled 2014-06-28: qty 2

## 2014-06-28 MED ORDER — DEXTROSE 5 % IV SOLN
1.0000 g | INTRAVENOUS | Status: DC
  Administered 2014-06-28: 1 g via INTRAVENOUS
  Filled 2014-06-28: qty 10

## 2014-06-28 MED ORDER — SODIUM CHLORIDE 0.9 % IV BOLUS (SEPSIS)
1000.0000 mL | INTRAVENOUS | Status: AC
  Administered 2014-06-28 – 2014-06-29 (×2): 1000 mL via INTRAVENOUS

## 2014-06-28 NOTE — ED Provider Notes (Signed)
CSN: [id number]     Arrival date & time 06/28/14  1837 History   First MD Initiated Contact with Patient 06/28/14 2211     Chief Complaint  Patient presents with  . Flank Pain     (Consider location/radiation/quality/duration/timing/severity/associated sxs/prior Treatment) HPI Comments: The patient is a pleasant 81 year old female with a history of dementia, acid reflux and hypertension who presents with increased cough, right-sided pain and some altered mental status. The son states that she is not her normal self, she does not appear to interact normally with others, she is not having any urinary discomfort or bowel abnormalities but has had increasing cough. She was unable to be seen at her doctor's office today due to the late hour. The patient is unable to give me any further history other than pointing to her right upper quadrant and right side stating that's where the pain is  Patient is a 81 y.o. female presenting with flank pain. The history is provided by the patient and a relative.  Flank Pain    Past Medical History  Diagnosis Date  . Allergy   . Hypertension   . Hyperlipidemia   . GERD (gastroesophageal reflux disease)   . Asthma   . Hx of varicella   . History of positive PPD     gets c xray screen  . Dementia    History reviewed. No pertinent past surgical history. Family History  Problem Relation Age of Onset  . Hypertension      fhx  . Diabetes Mother   . Stroke Mother   . Diabetes Father   . Edema Father     legs   History  Substance Use Topics  . Smoking status: Never Smoker   . Smokeless tobacco: Never Used  . Alcohol Use: No   OB History    No data available     Review of Systems  Unable to perform ROS: Dementia  Genitourinary: Positive for flank pain.      Allergies  Tuberculin purified protein derivative  Home Medications   Prior to Admission medications   Medication Sig Start Date End Date Taking? Authorizing Provider  aspirin 81  MG EC tablet Take 81 mg by mouth daily.      Historical Provider, MD  Calcium Carb-Cholecalciferol (CALCIUM+D3) 600-800 MG-UNIT TABS Take by mouth.    Historical Provider, MD  donepezil (ARICEPT) 10 MG tablet TAKE 1 TABLET BY MOUTH AT BEDTIME AS NEEDED 05/31/14   Burnis Medin, MD  hydrochlorothiazide (MICROZIDE) 12.5 MG capsule TAKE 1 CAPSULE (12.5 MG TOTAL) BY MOUTH DAILY. 03/10/14   Burnis Medin, MD  MULTIPLE VITAMIN PO Take 1 tablet by mouth daily.    Historical Provider, MD  Multiple Vitamins-Minerals (CENTRUM SILVER PO) Take by mouth.    Historical Provider, MD  omeprazole (PRILOSEC) 40 MG capsule TAKE ONE CAPSULE BY MOUTH EVERY DAY 03/10/14   Burnis Medin, MD  simvastatin (ZOCOR) 40 MG tablet TAKE 1 TABLET BY MOUTH AT BEDTIME 05/26/14   Burnis Medin, MD  traMADol (ULTRAM) 50 MG tablet Take 1 tablet (50 mg total) by mouth at bedtime as needed. 03/02/14   Burnis Medin, MD   BP 104/76 mmHg  Pulse 93  Temp(Src) 102.3 F (39.1 C) (Rectal)  Resp 21  Wt 156 lb (70.761 kg)  SpO2 96% Physical Exam  Constitutional: She appears well-developed and well-nourished. No distress.  HENT:  Head: Normocephalic and atraumatic.  Mouth/Throat: Oropharynx is clear and moist. No oropharyngeal exudate.  Eyes: Conjunctivae and EOM are normal. Pupils are equal, round, and reactive to light. Right eye exhibits no discharge. Left eye exhibits no discharge. No scleral icterus.  Neck: Normal range of motion. Neck supple. No JVD present. No thyromegaly present.  Cardiovascular: Normal rate, regular rhythm, normal heart sounds and intact distal pulses.  Exam reveals no gallop and no friction rub.   No murmur heard. Pulmonary/Chest: Effort normal. No respiratory distress. She has no wheezes. She has rales (rales at the right base).  Abdominal: Soft. Bowel sounds are normal. She exhibits no distension and no mass. There is tenderness (minimal tenderness of right upper quadrant, right side, no Murphy sign).   No pain at McBurney's point  Musculoskeletal: Normal range of motion. She exhibits no edema or tenderness.  Lymphadenopathy:    She has no cervical adenopathy.  Neurological: She is alert. Coordination normal.  Skin: Skin is warm and dry. No rash noted. No erythema.  Psychiatric: She has a normal mood and affect. Her behavior is normal.  Nursing note and vitals reviewed.   ED Course  Procedures (including critical care time) Labs Review Labs Reviewed  CBC WITH DIFFERENTIAL/PLATELET - Abnormal; Notable for the following:    WBC 13.2 (*)    Neutrophils Relative % 88 (*)    Neutro Abs 11.7 (*)    Lymphocytes Relative 5 (*)    Lymphs Abs 0.6 (*)    All other components within normal limits  COMPREHENSIVE METABOLIC PANEL - Abnormal; Notable for the following:    Glucose, Bld 168 (*)    BUN 24 (*)    Creatinine, Ser 1.90 (*)    Albumin 3.3 (*)    AST 50 (*)    Total Bilirubin 1.3 (*)    GFR calc non Af Amer 24 (*)    GFR calc Af Amer 27 (*)    All other components within normal limits  URINALYSIS, ROUTINE W REFLEX MICROSCOPIC - Abnormal; Notable for the following:    Specific Gravity, Urine 1.001 (*)    All other components within normal limits  I-STAT CG4 LACTIC ACID, ED - Abnormal; Notable for the following:    Lactic Acid, Venous 3.00 (*)    All other components within normal limits  CULTURE, BLOOD (ROUTINE X 2)  CULTURE, BLOOD (ROUTINE X 2)  URINE CULTURE    Imaging Review Dg Chest Port 1 View  06/28/2014   CLINICAL DATA:  Right-sided flank pain beginning today. Hypertension and asthma.  EXAM: PORTABLE CHEST - 1 VIEW  COMPARISON:  10/04/2010  FINDINGS: The heart is enlarged. There is abnormal density in both lower lobes consistent with atelectasis/ pneumonia. The upper lungs are clear except for chronic pleural and parenchymal scarring. No acute bony finding.  IMPRESSION: Abnormal density in both lower lobes consistent with atelectasis/pneumonia.   Electronically Signed    By: Nelson Chimes M.D.   On: 06/28/2014 23:27      MDM   Final diagnoses:  Cough  Sepsis, due to unspecified organism  CAP (community acquired pneumonia)    The patient does meet systemic inflammatory response syndrome criteria with a fever of 102.3, respirations of 28, pulse of 93 and a white blood cell count of 13,000. I am concerned that she is developing early sepsis, we'll start with IV fluids, antibiotics for community-acquired pneumonia. Her lactic acid is 3, her renal function is at baseline at creatinine of 1.9, liver function tests essentially within normal limits. Blood culture sent, no signs of urinary tract infection,  critical care provided for sepsis.  Care d/w Dr. Shanon Brow who will admit to hospital for sepsis in the setting of CAP.  Meds given in ED:  Medications  sodium chloride 0.9 % bolus 1,000 mL (1,000 mLs Intravenous New Bag/Given 06/28/14 2244)    Followed by  sodium chloride 0.9 % bolus 500 mL (500 mLs Intravenous New Bag/Given 06/28/14 2249)  azithromycin (ZITHROMAX) 500 mg in dextrose 5 % 250 mL IVPB (500 mg Intravenous New Bag/Given 06/28/14 2252)  cefTRIAXone (ROCEPHIN) 1 g in dextrose 5 % 50 mL IVPB (1 g Intravenous New Bag/Given 06/28/14 2247)  acetaminophen (TYLENOL) tablet 650 mg (650 mg Oral Given 06/28/14 2207)   CRITICAL CARE Performed by: Noemi Chapel D Total critical care time: 35 Critical care time was exclusive of separately billable procedures and treating other patients. Critical care was necessary to treat or prevent imminent or life-threatening deterioration. Critical care was time spent personally by me on the following activities: development of treatment plan with patient and/or surrogate as well as nursing, discussions with consultants, evaluation of patient's response to treatment, examination of patient, obtaining history from patient or surrogate, ordering and performing treatments and interventions, ordering and review of laboratory studies,  ordering and review of radiographic studies, pulse oximetry and re-evaluation of patient's condition.     Noemi Chapel, MD 06/28/14 [phone]

## 2014-06-28 NOTE — ED Notes (Signed)
Presents with right flank/ pain and right lower side pain. Pt has demetia-caregiver is historian-he is unsure where the pain is exactly, reports that she was getting up and down and walking like she is hurting and in pain.  Patient reports last BM was  acouple days ago, caregiver believes it was a week ago. Patient denies urinary discomfort, reports urinating more than normal. Afebrile. 98.6.

## 2014-06-28 NOTE — ED Notes (Signed)
Called Lvl 2 Code Sepsis To [facility].

## 2014-06-28 NOTE — H&P (Signed)
PCP:   Kelly Dawson, MD   Chief Complaint:  fever  HPI: 81 yo female h/o dementia comes in with one day of high fever and cough.  No n/v/d.  Lives with friend who is HCPOA/POA Kelly Conrad who has taken care of her for 21 years.  No rashes.  Goes to church several times Conrad week, still ambulates.  C/o no pain anywhere.  Found to have pna.  Not frequently on abx.  Review of Systems:  Positive and negative as per HPI otherwise all other systems are negative  Past Medical History: Past Medical History  Diagnosis Date  . Allergy   . Hypertension   . Hyperlipidemia   . GERD (gastroesophageal reflux disease)   . Asthma   . Hx of varicella   . History of positive PPD     gets c xray screen  . Dementia    History reviewed. No pertinent past surgical history.  Medications: Prior to Admission medications   Medication Sig Start Date End Date Taking? Authorizing Provider  aspirin 81 MG EC tablet Take 81 mg by mouth daily.      Historical Provider, MD  Calcium Carb-Cholecalciferol (CALCIUM+D3) 600-800 MG-UNIT TABS Take by mouth.    Historical Provider, MD  donepezil (ARICEPT) 10 MG tablet TAKE 1 TABLET BY MOUTH AT BEDTIME AS NEEDED 05/31/14   Burnis Medin, MD  hydrochlorothiazide (MICROZIDE) 12.5 MG capsule TAKE 1 CAPSULE (12.5 MG TOTAL) BY MOUTH DAILY. 03/10/14   Burnis Medin, MD  MULTIPLE VITAMIN PO Take 1 tablet by mouth daily.    Historical Provider, MD  Multiple Vitamins-Minerals (CENTRUM SILVER PO) Take by mouth.    Historical Provider, MD  omeprazole (PRILOSEC) 40 MG capsule TAKE ONE CAPSULE BY MOUTH EVERY DAY 03/10/14   Burnis Medin, MD  simvastatin (ZOCOR) 40 MG tablet TAKE 1 TABLET BY MOUTH AT BEDTIME 05/26/14   Burnis Medin, MD  traMADol (ULTRAM) 50 MG tablet Take 1 tablet (50 mg total) by mouth at bedtime as needed. 03/02/14   Burnis Medin, MD    Allergies:   Allergies  Allergen Reactions  . Tuberculin Purified Protein Derivative     REACTION: POSITIVE  RESULT  --CXR    Social History:  reports that she has never smoked. She has never used smokeless tobacco. She reports that she does not drink alcohol or use illicit drugs.  Family History: Family History  Problem Relation Age of Onset  . Hypertension      fhx  . Diabetes Mother   . Stroke Mother   . Diabetes Father   . Edema Father     legs    Physical Exam: Filed Vitals:   06/28/14 2200 06/28/14 2230 06/28/14 2245 06/28/14 2300  BP: 110/80 130/92 111/68 104/76  Pulse: 93 94 93 93  Temp:      TempSrc:      Resp: 28 30 27 21   Weight:  70.761 kg (156 lb)    SpO2: 96% 97% 95% 96%   General appearance: alert, cooperative and no distress Head: Normocephalic, without obvious abnormality, atraumatic Eyes: negative Nose: Nares normal. Septum midline. Mucosa normal. No drainage or sinus tenderness. Neck: no JVD and supple, symmetrical, trachea midline Lungs: clear to auscultation bilaterally Heart: regular rate and rhythm, S1, S2 normal, no murmur, click, rub or gallop Abdomen: soft, non-tender; bowel sounds normal; no masses,  no organomegaly Extremities: extremities normal, atraumatic, no cyanosis or edema Pulses: 2+ and symmetric Skin: Skin color, texture, turgor  normal. No rashes or lesions Neurologic: Grossly normal    Labs on Admission:   Recent Labs  06/28/14 1931  NA 136  K 3.6  CL 98  CO2 25  GLUCOSE 168*  BUN 24*  CREATININE 1.90*  CALCIUM 8.6    Recent Labs  06/28/14 1931  AST 50*  ALT 33  ALKPHOS 102  BILITOT 1.3*  PROT 7.3  ALBUMIN 3.3*    Recent Labs  06/28/14 1931  WBC 13.2*  NEUTROABS 11.7*  HGB 14.0  HCT 41.9  MCV 87.8  PLT 192   Radiological Exams on Admission: Dg Chest Port 1 View  06/28/2014   CLINICAL DATA:  Right-sided flank pain beginning today. Hypertension and asthma.  EXAM: PORTABLE CHEST - 1 VIEW  COMPARISON:  10/04/2010  FINDINGS: The heart is enlarged. There is abnormal density in both lower lobes consistent with  atelectasis/ pneumonia. The upper lungs are clear except for chronic pleural and parenchymal scarring. No acute bony finding.  IMPRESSION: Abnormal density in both lower lobes consistent with atelectasis/pneumonia.   Electronically Signed   By: Nelson Chimes M.D.   On: 06/28/2014 23:27    Assessment/Plan  81 yo female with CAP and likely early sepsis  Principal Problem:   CAP (community acquired pneumonia)-  Add on quick flu.  Place on pna pathway on iv azithro and rocephin.  Likely early sepsis, bolused in ED,  Initial lactic acid 3, repeat in several hours ordered.  Vss, o2 sats nml at this time.  Blood cx pending.  Active Problems:  Stable unless o/w noted, home meds pending clarification    Essential hypertension-     Osteoporosis   Dementia   Benign hypertensive renal disease   Sepsis  Admit to medical.  Likely LOS 2 days.  Kelly Conrad 06/28/2014, 11:48 PM

## 2014-06-29 ENCOUNTER — Telehealth: Payer: Self-pay

## 2014-06-29 ENCOUNTER — Encounter (HOSPITAL_COMMUNITY): Payer: Self-pay | Admitting: *Deleted

## 2014-06-29 ENCOUNTER — Inpatient Hospital Stay (HOSPITAL_COMMUNITY): Payer: Medicare Other

## 2014-06-29 DIAGNOSIS — R0602 Shortness of breath: Secondary | ICD-10-CM

## 2014-06-29 DIAGNOSIS — J189 Pneumonia, unspecified organism: Secondary | ICD-10-CM

## 2014-06-29 DIAGNOSIS — J9600 Acute respiratory failure, unspecified whether with hypoxia or hypercapnia: Secondary | ICD-10-CM | POA: Insufficient documentation

## 2014-06-29 DIAGNOSIS — J9601 Acute respiratory failure with hypoxia: Secondary | ICD-10-CM

## 2014-06-29 DIAGNOSIS — F0390 Unspecified dementia without behavioral disturbance: Secondary | ICD-10-CM

## 2014-06-29 DIAGNOSIS — A419 Sepsis, unspecified organism: Principal | ICD-10-CM

## 2014-06-29 DIAGNOSIS — R0609 Other forms of dyspnea: Secondary | ICD-10-CM

## 2014-06-29 LAB — URINALYSIS, ROUTINE W REFLEX MICROSCOPIC
Glucose, UA: NEGATIVE mg/dL
Ketones, ur: NEGATIVE mg/dL
Nitrite: NEGATIVE
Protein, ur: 100 mg/dL — AB
SPECIFIC GRAVITY, URINE: 1.027 (ref 1.005–1.030)
UROBILINOGEN UA: 2 mg/dL — AB (ref 0.0–1.0)
pH: 5 (ref 5.0–8.0)

## 2014-06-29 LAB — BLOOD GAS, ARTERIAL
ACID-BASE DEFICIT: 2.9 mmol/L — AB (ref 0.0–2.0)
Bicarbonate: 21.5 mEq/L (ref 20.0–24.0)
Drawn by: 249101
O2 CONTENT: 2 L/min
O2 Saturation: 90 %
PCO2 ART: 37.9 mmHg (ref 35.0–45.0)
Patient temperature: 98.6
TCO2: 22.7 mmol/L (ref 0–100)
pH, Arterial: 7.373 (ref 7.350–7.450)
pO2, Arterial: 61.9 mmHg — ABNORMAL LOW (ref 80.0–100.0)

## 2014-06-29 LAB — PROCALCITONIN: PROCALCITONIN: 2.58 ng/mL

## 2014-06-29 LAB — BASIC METABOLIC PANEL
Anion gap: 11 (ref 5–15)
BUN: 21 mg/dL (ref 6–23)
CALCIUM: 7.2 mg/dL — AB (ref 8.4–10.5)
CO2: 22 mmol/L (ref 19–32)
Chloride: 104 mmol/L (ref 96–112)
Creatinine, Ser: 1.55 mg/dL — ABNORMAL HIGH (ref 0.50–1.10)
GFR, EST AFRICAN AMERICAN: 35 mL/min — AB (ref >90)
GFR, EST NON AFRICAN AMERICAN: 30 mL/min — AB (ref >90)
Glucose, Bld: 112 mg/dL — ABNORMAL HIGH (ref 70–99)
Potassium: 3.1 mmol/L — ABNORMAL LOW (ref 3.5–5.1)
Sodium: 137 mmol/L (ref 135–145)

## 2014-06-29 LAB — STREP PNEUMONIAE URINARY ANTIGEN: Strep Pneumo Urinary Antigen: NEGATIVE

## 2014-06-29 LAB — CBC WITH DIFFERENTIAL/PLATELET
Basophils Absolute: 0 10*3/uL (ref 0.0–0.1)
Basophils Relative: 0 % (ref 0–1)
Eosinophils Absolute: 0 10*3/uL (ref 0.0–0.7)
Eosinophils Relative: 0 % (ref 0–5)
HCT: 35 % — ABNORMAL LOW (ref 36.0–46.0)
HEMOGLOBIN: 11.8 g/dL — AB (ref 12.0–15.0)
LYMPHS ABS: 0.7 10*3/uL (ref 0.7–4.0)
Lymphocytes Relative: 7 % — ABNORMAL LOW (ref 12–46)
MCH: 29.4 pg (ref 26.0–34.0)
MCHC: 33.7 g/dL (ref 30.0–36.0)
MCV: 87.1 fL (ref 78.0–100.0)
MONO ABS: 0.7 10*3/uL (ref 0.1–1.0)
MONOS PCT: 7 % (ref 3–12)
Neutro Abs: 9 10*3/uL — ABNORMAL HIGH (ref 1.7–7.7)
Neutrophils Relative %: 86 % — ABNORMAL HIGH (ref 43–77)
Platelets: 152 10*3/uL (ref 150–400)
RBC: 4.02 MIL/uL (ref 3.87–5.11)
RDW: 13.6 % (ref 11.5–15.5)
WBC: 10.5 10*3/uL (ref 4.0–10.5)

## 2014-06-29 LAB — TROPONIN I
TROPONIN I: 0.04 ng/mL — AB (ref <0.031)
Troponin I: 0.17 ng/mL — ABNORMAL HIGH (ref <0.031)

## 2014-06-29 LAB — URINE MICROSCOPIC-ADD ON

## 2014-06-29 LAB — TSH: TSH: 1.791 u[IU]/mL (ref 0.350–4.500)

## 2014-06-29 LAB — BRAIN NATRIURETIC PEPTIDE: B Natriuretic Peptide: 353.9 pg/mL — ABNORMAL HIGH (ref 0.0–100.0)

## 2014-06-29 LAB — GLUCOSE, CAPILLARY: GLUCOSE-CAPILLARY: 150 mg/dL — AB (ref 70–99)

## 2014-06-29 LAB — LACTIC ACID, PLASMA: LACTIC ACID, VENOUS: 1.3 mmol/L (ref 0.5–2.0)

## 2014-06-29 LAB — MRSA PCR SCREENING: MRSA BY PCR: NEGATIVE

## 2014-06-29 MED ORDER — FUROSEMIDE 10 MG/ML IJ SOLN
INTRAMUSCULAR | Status: AC
  Administered 2014-06-29: 40 mg via INTRAVENOUS
  Filled 2014-06-29: qty 4

## 2014-06-29 MED ORDER — SENNOSIDES-DOCUSATE SODIUM 8.6-50 MG PO TABS
2.0000 | ORAL_TABLET | Freq: Two times a day (BID) | ORAL | Status: DC
  Administered 2014-06-30 – 2014-07-04 (×9): 2 via ORAL
  Filled 2014-06-29 (×9): qty 2

## 2014-06-29 MED ORDER — METHYLPREDNISOLONE SODIUM SUCC 125 MG IJ SOLR
60.0000 mg | INTRAMUSCULAR | Status: AC
  Administered 2014-06-29: 60 mg via INTRAVENOUS
  Filled 2014-06-29: qty 2

## 2014-06-29 MED ORDER — CEFTRIAXONE SODIUM IN DEXTROSE 20 MG/ML IV SOLN
1.0000 g | INTRAVENOUS | Status: DC
  Administered 2014-06-30 (×2): 1 g via INTRAVENOUS
  Filled 2014-06-29 (×4): qty 50

## 2014-06-29 MED ORDER — GUAIFENESIN ER 600 MG PO TB12
600.0000 mg | ORAL_TABLET | Freq: Two times a day (BID) | ORAL | Status: DC
  Administered 2014-06-30 – 2014-07-04 (×8): 600 mg via ORAL
  Filled 2014-06-29 (×12): qty 1

## 2014-06-29 MED ORDER — ONDANSETRON HCL 4 MG/2ML IJ SOLN: 4.0000 mg | Freq: Three times a day (TID) | INTRAMUSCULAR | Status: AC | PRN

## 2014-06-29 MED ORDER — IPRATROPIUM-ALBUTEROL 0.5-2.5 (3) MG/3ML IN SOLN
3.0000 mL | Freq: Four times a day (QID) | RESPIRATORY_TRACT | Status: DC
  Administered 2014-06-29 – 2014-07-01 (×8): 3 mL via RESPIRATORY_TRACT
  Filled 2014-06-29 (×9): qty 3

## 2014-06-29 MED ORDER — ENOXAPARIN SODIUM 30 MG/0.3ML ~~LOC~~ SOLN
30.0000 mg | SUBCUTANEOUS | Status: DC
  Administered 2014-06-29 – 2014-07-04 (×6): 30 mg via SUBCUTANEOUS
  Filled 2014-06-29 (×6): qty 0.3

## 2014-06-29 MED ORDER — FUROSEMIDE 10 MG/ML IJ SOLN
40.0000 mg | Freq: Three times a day (TID) | INTRAMUSCULAR | Status: DC
Start: 2014-06-30 — End: 2014-06-29

## 2014-06-29 MED ORDER — CETYLPYRIDINIUM CHLORIDE 0.05 % MT LIQD: 7.0000 mL | Freq: Two times a day (BID) | OROMUCOSAL | Status: DC

## 2014-06-29 MED ORDER — FUROSEMIDE 10 MG/ML IJ SOLN
80.0000 mg | Freq: Once | INTRAMUSCULAR | Status: AC
  Administered 2014-06-29: 80 mg via INTRAVENOUS
  Filled 2014-06-29: qty 8

## 2014-06-29 MED ORDER — SODIUM CHLORIDE 0.9 % IV BOLUS (SEPSIS)
500.0000 mL | Freq: Once | INTRAVENOUS | Status: AC
  Administered 2014-06-29: 500 mL via INTRAVENOUS

## 2014-06-29 MED ORDER — SODIUM CHLORIDE 0.9 % IV SOLN: INTRAVENOUS | Status: AC

## 2014-06-29 MED ORDER — VANCOMYCIN HCL IN DEXTROSE 750-5 MG/150ML-% IV SOLN
750.0000 mg | INTRAVENOUS | Status: DC
  Administered 2014-06-29: 750 mg via INTRAVENOUS
  Filled 2014-06-29 (×3): qty 150

## 2014-06-29 MED ORDER — SODIUM CHLORIDE 0.9 % IV BOLUS (SEPSIS): 1000.0000 mL | INTRAVENOUS | Status: AC

## 2014-06-29 MED ORDER — ACETAMINOPHEN 325 MG PO TABS
650.0000 mg | ORAL_TABLET | Freq: Four times a day (QID) | ORAL | Status: DC | PRN
  Administered 2014-06-29: 650 mg via ORAL
  Filled 2014-06-29: qty 2

## 2014-06-29 MED ORDER — FUROSEMIDE 10 MG/ML IJ SOLN
40.0000 mg | Freq: Once | INTRAMUSCULAR | Status: AC
  Administered 2014-06-29: 40 mg via INTRAVENOUS

## 2014-06-29 MED ORDER — IPRATROPIUM-ALBUTEROL 0.5-2.5 (3) MG/3ML IN SOLN
RESPIRATORY_TRACT | Status: AC
  Administered 2014-06-29: 3 mL via RESPIRATORY_TRACT
  Filled 2014-06-29: qty 3

## 2014-06-29 MED ORDER — POLYETHYLENE GLYCOL 3350 17 G PO PACK
17.0000 g | PACK | Freq: Every day | ORAL | Status: DC
  Administered 2014-06-29 – 2014-07-04 (×6): 17 g via ORAL
  Filled 2014-06-29 (×6): qty 1

## 2014-06-29 MED ORDER — IPRATROPIUM-ALBUTEROL 0.5-2.5 (3) MG/3ML IN SOLN
3.0000 mL | Freq: Four times a day (QID) | RESPIRATORY_TRACT | Status: DC | PRN
  Administered 2014-06-29: 3 mL via RESPIRATORY_TRACT

## 2014-06-29 MED ORDER — SODIUM CHLORIDE 0.9 % IV SOLN
INTRAVENOUS | Status: DC
  Administered 2014-06-29 (×2): via INTRAVENOUS

## 2014-06-29 MED ORDER — FUROSEMIDE 10 MG/ML IJ SOLN
40.0000 mg | Freq: Three times a day (TID) | INTRAMUSCULAR | Status: AC
  Administered 2014-06-29 – 2014-06-30 (×2): 40 mg via INTRAVENOUS
  Filled 2014-06-29 (×2): qty 4

## 2014-06-29 MED ORDER — IPRATROPIUM-ALBUTEROL 0.5-2.5 (3) MG/3ML IN SOLN: 3.0000 mL | RESPIRATORY_TRACT | Status: DC

## 2014-06-29 MED ORDER — DEXTROSE 5 % IV SOLN
500.0000 mg | INTRAVENOUS | Status: DC
  Administered 2014-06-29 – 2014-07-02 (×4): 500 mg via INTRAVENOUS
  Filled 2014-06-29 (×6): qty 500

## 2014-06-29 MED ORDER — IPRATROPIUM-ALBUTEROL 0.5-2.5 (3) MG/3ML IN SOLN
3.0000 mL | Freq: Three times a day (TID) | RESPIRATORY_TRACT | Status: DC
  Administered 2014-06-29: 3 mL via RESPIRATORY_TRACT
  Filled 2014-06-29: qty 3

## 2014-06-29 MED ORDER — POTASSIUM CHLORIDE 10 MEQ/100ML IV SOLN
10.0000 meq | INTRAVENOUS | Status: AC
Start: 2014-06-29 — End: 2014-06-29
  Administered 2014-06-29 (×3): 10 meq via INTRAVENOUS
  Filled 2014-06-29 (×3): qty 100

## 2014-06-29 MED ORDER — CHLORHEXIDINE GLUCONATE 0.12 % MT SOLN
15.0000 mL | Freq: Two times a day (BID) | OROMUCOSAL | Status: DC
  Administered 2014-06-29 – 2014-06-30 (×2): 15 mL via OROMUCOSAL
  Filled 2014-06-29 (×2): qty 15

## 2014-06-29 NOTE — Evaluation (Signed)
Clinical/Bedside Swallow Evaluation Patient Details  Name: Kelly Conrad MRN: [id number] Date of Birth: [birth year]  Today's Date: 06/29/2014 Time: SLP Start Time (ACUTE ONLY): 9983 SLP Stop Time (ACUTE ONLY): 1529 SLP Time Calculation (min) (ACUTE ONLY): 18 min  Past Medical History:  Past Medical History  Diagnosis Date  . Allergy   . Hypertension   . Hyperlipidemia   . GERD (gastroesophageal reflux disease)   . Asthma   . Hx of varicella   . History of positive PPD     gets c xray screen  . Dementia    Past Surgical History: History reviewed. No pertinent past surgical history. HPI:  81 yo female with CAP and likely early sepsis. PMH significant for GERD, asthma, and dementia.   Assessment / Plan / Recommendation Clinical Impression  Pt with apparent SOB upon SLP arrival despite just completing breathing treatment. Questionable wet vocal quality with thin liquid trials although otherwise without overt signs of aspiration. Her current respirations do put her at increased risk of aspiration though, and she cognitievly is not able to complete the 3 oz water test to better assess for risk of silent aspiration. Therefore, recommend Dys 1 diet to conserve energy and small, single sips of nectar thick liquids. Pt requires Mod cues for appropriate pacing, and therefore requires full supervision. SLP to follow for tolerance and advancement.    Aspiration Risk  Moderate    Diet Recommendation Dysphagia 1 (Puree);Nectar-thick liquid   Liquid Administration via: Cup;No straw Medication Administration: Crushed with puree Supervision: Patient able to self feed;Full supervision/cueing for compensatory strategies Compensations: Slow rate;Small sips/bites Postural Changes and/or Swallow Maneuvers: Seated upright 90 degrees;Upright 30-60 min after meal    Other  Recommendations Oral Care Recommendations: Oral care BID Other Recommendations: Order thickener from pharmacy;Prohibited food (jello,  ice cream, thin soups);Remove water pitcher   Follow Up Recommendations   (tba)    Frequency and Duration min 2x/week  2 weeks   Pertinent Vitals/Pain No reports of pain    SLP Swallow Goals     Swallow Study General HPI: 81 yo female with CAP and likely early sepsis. PMH significant for GERD, asthma, and dementia. Type of Study: Bedside swallow evaluation Previous Swallow Assessment: none in chart Diet Prior to this Study: NPO Temperature Spikes Noted: Yes Respiratory Status: Nasal cannula History of Recent Intubation: No Behavior/Cognition: Alert;Cooperative;Requires cueing;Impulsive Self-Feeding Abilities: Able to feed self Patient Positioning: Upright in bed Baseline Vocal Quality: Clear Volitional Swallow: Able to elicit    Oral/Motor/Sensory Function Overall Oral Motor/Sensory Function: Appears within functional limits for tasks assessed   Ice Chips Ice chips: Not tested   Thin Liquid Thin Liquid: Impaired Presentation: Cup;Self Fed Pharyngeal  Phase Impairments: Suspected delayed Swallow;Wet Vocal Quality    Nectar Thick Nectar Thick Liquid: Impaired Presentation: Cup;Self Fed Pharyngeal Phase Impairments: Suspected delayed Swallow   Honey Thick Honey Thick Liquid: Not tested   Puree Puree: Impaired Presentation: Self Fed;Spoon Pharyngeal Phase Impairments: Suspected delayed Swallow   Solid    Solid: Impaired Presentation: Self Fed;Spoon Oral Phase Functional Implications: Other (comment) (prolonged oral prep/transit)      Germain Osgood, M.A. CCC-SLP [phone]  Germain Osgood 06/29/2014,3:37 PM

## 2014-06-29 NOTE — Significant Event (Signed)
Rapid Response Event Note  Overview: Time Called: [place] Time: 1650    Initial Focused Assessment:  Called by primary RN for respiratory distress.  Patient sitting up in bed, SOB, increased WOB and accessory muscle use.  Family at bedside. Patient on nasal cannula 2 lpm sats 95%.  RR 36, diaphoretic.  Breath sounds crackles and expiratory wheeze   Interventions:orders received by MD, RN placed foley, with dark cloudy urine. Lasix given.  CCM at bedside   Event Summary:  Patient transported via bed on portable monitor and oxygen to 2 M 06.   at      at          [facility], Harlin Rain

## 2014-06-29 NOTE — Telephone Encounter (Signed)
Pt walked in with caregiver, [name] at 4:40 on 06/28/14 with complaint from caregiver that pt "is in some kind of pain in her side." He states that he palpated her abdomen and found that she seemed to have RUQ pain. He notes that she was not as active during the day as usual and that she laid around on the couch a lot. When asked when her last BM was pt says it's been a least a week ago. She and caregiver denies any SOB, chest pain, fever.    Vitals: BP 108/64, P 109, RR 18   Dr. Regis Bill suggests scheduling an OV for Tuesday or Wednesday and if sxs become worse, report to ER.  Pt caregiver feels that her sxs are emergent and that she needs to be seen NOW. With his concerns, we suggested that he take her to the ER right now. He verbalized understanding

## 2014-06-29 NOTE — Progress Notes (Signed)
Called ER RN for report. Room ready for report.

## 2014-06-29 NOTE — Progress Notes (Deleted)
RT spoke with patient about wearing BiPAP and the patient stated that she didn't want to wear the BiPAP tonight. RT advised that patient that if she changed her mind or she needed the BiPAP that the BiPAP would be placed on for her. Patient understood but stated she still didn't want to wear tonight. RT made RN aware.

## 2014-06-29 NOTE — Progress Notes (Addendum)
PROGRESS NOTE  Kelly Conrad:[id number] DOB: 1931/08/27 DOA: 06/28/2014 PCP: Lottie Dawson, MD  HPI/Recap of past 24 hours:  9am: reported Breathing better, no cough during exam, does has tremor, course and mild wheezes on right lower lobe, Reported no bm for several days, denies ab pain, no n/v.  4:30 pm , paged by RN to reported patient started to have increased work of breathing, sats above 90 on 2liter, bp stable,  Stat labs/imaging ordered, PCCM contacted.  Assessment/Plan: Principal Problem:   CAP (community acquired pneumonia) Active Problems:   Essential hypertension   Osteoporosis   Dementia   Benign hypertensive renal disease   Sepsis  Hypoxic respiratory failure: possible combination of PNA and CHF, patient did have fever/leukocytosis/lactic acidosis on admission. Urine strep pneumo negative, urine legionella pending. Decompensated this pm, Stat ABG on 2liter oxygen showed hypoxia,  STAT cxr pna/CHF/pleural effusion?Marland Kitchen  On exam increased work of breath, not able to talk in full sentences, increased wheezing comparing to this am, check cardiac enzymes/ekg/echo, broaden abx, start solumedrol, lasix40mg  x1, transfer to stepdown unit. PCCM contacted, will come to evaluate patient.  Continue droplet precaution, awaiting respiratory virus panel.  Sepsis: with fever/leukocytosis/lactic acidosis on admission.  ivf stopped due to concerning of fluids overload. abx broadened due to decompensation this pm.  ARF on CKF III, cr 1.9 on admission, 1.55 this am, ua no infection. Renal dosing meds.  Mild Elevated fasting blood sugar, check a1c, no prior diagnosis of DM2,   H/o Dementia: mild, has been on aricept for about a year, AAOX3, but impaired memory   Code Status: full, confirmed with patient and long time caregiver at bedside.  Family Communication: patient and friends who has been taking care of her for the last 12yrs  Disposition Plan: to stepdown     Consultants:  none  Procedures:  none  Antibiotics:  Rocephin/zithro from 4/18  vanc from 4/19   Objective: BP 111/56 mmHg  Pulse 90  Temp(Src) 98.9 F (37.2 C) (Oral)  Resp 22  Wt 74.571 kg (164 lb 6.4 oz)  SpO2 97%  Intake/Output Summary (Last 24 hours) at 06/29/14 1711 Last data filed at 06/29/14 0800  Gross per 24 hour  Intake 886.25 ml  Output    800 ml  Net  86.25 ml   Filed Weights   06/28/14 2230 06/29/14 0106  Weight: 70.761 kg (156 lb) 74.571 kg (164 lb 6.4 oz)    Exam:   General:  Increased work of breathing, not able to finish full sentences, denies chest pain.  Cardiovascular: RRR  Respiratory: diffuse wheezing, worse on the right side   Abdomen: Soft/ND/NT, positive BS  Musculoskeletal: No Edema  Neuro: AAOx3, impaired memory  Data Reviewed: Basic Metabolic Panel:  Recent Labs Lab 06/28/14 1931 06/29/14 0605  NA 136 137  K 3.6 3.1*  CL 98 104  CO2 25 22  GLUCOSE 168* 112*  BUN 24* 21  CREATININE 1.90* 1.55*  CALCIUM 8.6 7.2*   Liver Function Tests:  Recent Labs Lab 06/28/14 1931  AST 50*  ALT 33  ALKPHOS 102  BILITOT 1.3*  PROT 7.3  ALBUMIN 3.3*   No results for input(s): LIPASE, AMYLASE in the last 168 hours. No results for input(s): AMMONIA in the last 168 hours. CBC:  Recent Labs Lab 06/28/14 1931 06/29/14 0605  WBC 13.2* 10.5  NEUTROABS 11.7* 9.0*  HGB 14.0 11.8*  HCT 41.9 35.0*  MCV 87.8 87.1  PLT 192 152   Cardiac Enzymes:  No results for input(s): CKTOTAL, CKMB, CKMBINDEX, TROPONINI in the last 168 hours. BNP (last 3 results) No results for input(s): BNP in the last 8760 hours.  ProBNP (last 3 results) No results for input(s): PROBNP in the last 8760 hours.  CBG: No results for input(s): GLUCAP in the last 168 hours.  No results found for this or any previous visit (from the past 240 hour(s)).   Studies: Dg Chest Port 1 View  06/28/2014   CLINICAL DATA:  Right-sided flank pain  beginning today. Hypertension and asthma.  EXAM: PORTABLE CHEST - 1 VIEW  COMPARISON:  10/04/2010  FINDINGS: The heart is enlarged. There is abnormal density in both lower lobes consistent with atelectasis/ pneumonia. The upper lungs are clear except for chronic pleural and parenchymal scarring. No acute bony finding.  IMPRESSION: Abnormal density in both lower lobes consistent with atelectasis/pneumonia.   Electronically Signed   By: Nelson Chimes M.D.   On: 06/28/2014 23:27    Scheduled Meds: . azithromycin  500 mg Intravenous Q24H  . cefTRIAXone (ROCEPHIN)  IV  1 g Intravenous Q24H  . enoxaparin (LOVENOX) injection  30 mg Subcutaneous Q24H  . guaiFENesin  600 mg Oral BID  . ipratropium-albuterol  3 mL Nebulization Q6H  . polyethylene glycol  17 g Oral Daily  . senna-docusate  2 tablet Oral BID  . vancomycin  750 mg Intravenous Q24H    Continuous Infusions:     Time spent: >52mins  Ryo Klang MD, PhD  [facility] Pager [phone]. If 7PM-7AM, please contact night-coverage at [email or web address], password [facility] 06/29/2014, 5:11 PM  LOS: 1 day

## 2014-06-29 NOTE — Progress Notes (Signed)
CRITICAL VALUE ALERT  Critical value received:  Troponin:  0.17  Date of notification:  06/29/2014  Time of notification:  2300  Critical value read back:Yes.    Nurse who received alert:  Corinda Gubler  MD notified (1st page):  Dr. Tomie China  Time of first page:  2315  MD notified (2nd page): Dr. Alcario Drought  Time of second page: 2330  Responding MD:  Dr. Alcario Drought  Time MD responded:  2334

## 2014-06-29 NOTE — Progress Notes (Signed)
Patient transported from [place] to room 2M06 and placed on Bipap.  Currently tolerating well.  Will continue to monitor.

## 2014-06-29 NOTE — Consult Note (Signed)
Name: Kelly Conrad MRN: [id number] DOB: 04/30/1931    ADMISSION DATE:  06/28/2014 CONSULTATION DATE:  06/29/2014  REFERRING MD :  Erlinda Hong  CHIEF COMPLAINT:  Dyspnea  BRIEF PATIENT DESCRIPTION: 81 y.o. F  brought to [facility] ED 4/18 with CAP.  Given fluids for elevated lactate and developed increased WOB due to pulmonary edema and progression of right pleural effusion along with hypoxia.  PCCM consulted 4/19 for increased WOB.  SIGNIFICANT EVENTS  4/18 - admit 4/19 - PCCM consulted, transferred to SDU, code status changed to DNR / DNI  STUDIES:  CXR 4/18 >>> density in b/l lower lobes, right effusion CXR 4/19 >>> cardiomegaly with vascular congestion, progression of right effusion and RLL airspace disease.   HISTORY OF PRESENT ILLNESS:  Kelly Conrad is a 81 y.o. F with PMH as outlined below.  She presented to [facility] ED 4/18 with one day hx of fever and cough.  CXR revealed findings c/w CAP.  She was admitted by [facility] and started on abx. On 4/19, she developed increased WOB and hypoxia.  She had been undergoing IVF resuscitation for mildly elevated lactate.  She complained of difficulty breathing and wheezing.  Denied chest pain, N/V/D, abd pain.  No fevers since admission.  She was initially full code; however, after discussion with Dr. Nelda Marseille, she made it clear that she would never want to be resuscitated or placed on mechanical ventilation.  She answered all orientation questions appropriately and was able to give name and address of her HCPOA.  She verbalized her wishes to be DNR in front of [place], [place].  PAST MEDICAL HISTORY :   has a past medical history of Allergy; Hypertension; Hyperlipidemia; GERD (gastroesophageal reflux disease); Asthma; varicella; History of positive PPD; and Dementia.  has no past surgical history on file. Prior to Admission medications   Medication Sig Start Date End Date Taking? Authorizing Provider  aspirin 81 MG EC tablet Take 81 mg by mouth daily.     Yes Historical  Provider, MD  donepezil (ARICEPT) 10 MG tablet TAKE 1 TABLET BY MOUTH AT BEDTIME AS NEEDED Patient taking differently: TAKE 1 TABLET BY MOUTH AT BEDTIME. 05/31/14  Yes Burnis Medin, MD  hydrochlorothiazide (MICROZIDE) 12.5 MG capsule TAKE 1 CAPSULE (12.5 MG TOTAL) BY MOUTH DAILY. 03/10/14  Yes Burnis Medin, MD  Multiple Vitamins-Minerals (CENTRUM SILVER PO) Take 1 tablet by mouth daily.    Yes Historical Provider, MD  simvastatin (ZOCOR) 40 MG tablet TAKE 1 TABLET BY MOUTH AT BEDTIME 05/26/14  Yes Burnis Medin, MD  traMADol (ULTRAM) 50 MG tablet Take 1 tablet (50 mg total) by mouth at bedtime as needed. Patient taking differently: Take 50 mg by mouth at bedtime as needed for moderate pain.  03/02/14  Yes Burnis Medin, MD   Allergies  Allergen Reactions  . Tuberculin Purified Protein Derivative     REACTION: POSITIVE RESULT  --CXR    FAMILY HISTORY:  family history includes Diabetes in her father and mother; Edema in her father; Hypertension in an other family member; Stroke in her mother. SOCIAL HISTORY:  reports that she has never smoked. She has never used smokeless tobacco. She reports that she does not drink alcohol or use illicit drugs.  REVIEW OF SYSTEMS:   All negative; except for those that are bolded, which indicate positives.  Constitutional: weight loss, weight gain, night sweats, fevers, chills, fatigue, weakness.  HEENT: headaches, sore throat, sneezing, nasal congestion, post nasal drip, difficulty swallowing, tooth/dental  problems, visual complaints, visual changes, ear aches. Neuro: difficulty with speech, weakness, numbness, ataxia. CV:  chest pain, orthopnea, PND, swelling in lower extremities, dizziness, palpitations, syncope.  Resp: cough, hemoptysis, dyspnea, wheezing, increased work of breathing. GI  heartburn, indigestion, abdominal pain, nausea, vomiting, diarrhea, constipation, change in bowel habits, loss of appetite, hematemesis, melena, hematochezia.   GU: dysuria, change in color of urine, urgency or frequency, flank pain, hematuria. MSK: joint pain or swelling, decreased range of motion. Psych: change in mood or affect, depression, anxiety, suicidal ideations, homicidal ideations. Skin: rash, itching, bruising.   SUBJECTIVE:  Complains of increased WOB.  Denies chest pain.  No fevers/chills/sweats.  VITAL SIGNS: Temp:  [98.6 F (37 C)-102.3 F (39.1 C)] 98.9 F (37.2 C) (04/19 0900) Pulse Rate:  [90-106] 90 (04/19 0900) Resp:  [16-30] 22 (04/19 0900) BP: (96-137)/(55-92) 111/56 mmHg (04/19 0900) SpO2:  [94 %-100 %] 97 % (04/19 0900) Weight:  [70.761 kg (156 lb)-74.571 kg (164 lb 6.4 oz)] 74.571 kg (164 lb 6.4 oz) (04/19 0106)  PHYSICAL EXAMINATION: General: Elderly appearing female, resting in bed, in mild respiratory distress. Neuro: A&O x 3, non-focal.  HEENT: [place]/AT. PERRL, sclerae anicteric. Cardiovascular: RRR, no M/R/G.  Lungs: Respirations shallow and mildly labored, diffuse crackles throughout. Abdomen: BS x 4, soft, NT/ND.  Musculoskeletal: No gross deformities, no edema.  Skin: Intact, warm, no rashes.    Recent Labs Lab 06/28/14 1931 06/29/14 0605  NA 136 137  K 3.6 3.1*  CL 98 104  CO2 25 22  BUN 24* 21  CREATININE 1.90* 1.55*  GLUCOSE 168* 112*    Recent Labs Lab 06/28/14 1931 06/29/14 0605  HGB 14.0 11.8*  HCT 41.9 35.0*  WBC 13.2* 10.5  PLT 192 152   Dg Chest Port 1 View  06/29/2014   CLINICAL DATA:  Short of breath 06/28/2014  EXAM: PORTABLE CHEST - 1 VIEW  COMPARISON:  None.  FINDINGS: Cardiac enlargement with vascular congestion. Progression of right pleural effusion which is now moderately large. Compressive atelectasis right lower lobe also has progressed. Mild left lower lobe atelectasis unchanged.  IMPRESSION: Cardiac enlargement with vascular congestion.  Progression of right effusion and right lower lobe airspace disease. Findings suggest either heart failure or pneumonia.    Electronically Signed   By: Franchot Gallo M.D.   On: 06/29/2014 16:40   Dg Chest Port 1 View  06/28/2014   CLINICAL DATA:  Right-sided flank pain beginning today. Hypertension and asthma.  EXAM: PORTABLE CHEST - 1 VIEW  COMPARISON:  10/04/2010  FINDINGS: The heart is enlarged. There is abnormal density in both lower lobes consistent with atelectasis/ pneumonia. The upper lungs are clear except for chronic pleural and parenchymal scarring. No acute bony finding.  IMPRESSION: Abnormal density in both lower lobes consistent with atelectasis/pneumonia.   Electronically Signed   By: Nelson Chimes M.D.   On: 06/28/2014 23:27    ASSESSMENT / PLAN:  Acute hypoxic respiratory failure Right pleural effusion Pulmonary edema / fluid overload CAP R/o flu DO NOT INTUBATE / RESUSCITATE  Recs: Transfer to SDU. Continue supplemental O2 as needed to maintain SpO2 > 90%. DuoNebs scheduled / Albuterol PRN. Start BiPAP PRN. Lasix 80 mg now, then 40mg  q8hrs x 2. Check BNP. Echo. Continue empiric abx (Ceftriaxone, Azithromycin, Vancomycin). Check PCT. F/u on cultures.  Rest per primary team.   Montey Hora, [place] Pager: [phone]  or [phone] 06/29/2014, 5:14 PM  Patient is in respiratory failure due to severe pulmonary edema evident by severe wheezing after IVF use.  Spoke with her and her HCPOA bedside.  After discussion, she reports she does not wish to be intubated.  We discussed that this would also mean no CPR/cardioversion/pressors i.e. DNR and patient stated that those where her wishes.  She was agreeable to BiPAP however.  Will transfer to 90M as step down status, make full DNR, make BiPAP available for comfort and aggressive diureses as above.  Agree with abx use and will f/u on cultures.  For services rendered 4/19.  The patient is critically ill with multiple organ systems failure and requires high complexity decision making for  assessment and support, frequent evaluation and titration of therapies, application of advanced monitoring technologies and extensive interpretation of multiple databases.   Critical Care Time devoted to patient care services described in this note is  35  Minutes. This time reflects time of care of this signee Dr Jennet Maduro. This critical care time does not reflect procedure time, or teaching time or supervisory time of PA/NP/Med student/Med Resident etc but could involve care discussion time.  Rush Farmer, M.D. [facility] Care Medicine. Pager: [phone]. After hours pager: [phone].

## 2014-06-29 NOTE — Progress Notes (Signed)
This is a late entry. Pt given a breathing treatment per MD order for SOB and increase work of breathing. After breathing treatment patient became diaphoretic and remained tachypneic. Rapid response nurse was paged and MD paged again, ordered chest xray and came to bedside. After reassessing MD ordered IV lasix 40 mg, and IV solumedrol. MD placed transfer orders and pt was transferred to 2M06

## 2014-06-29 NOTE — Progress Notes (Signed)
ANTIBIOTIC CONSULT NOTE - INITIAL  Pharmacy Consult for Vancomycin Indication: rule out pneumonia  Allergies  Allergen Reactions  . Tuberculin Purified Protein Derivative     REACTION: POSITIVE RESULT  --CXR    Patient Measurements: Weight: 164 lb 6.4 oz (74.571 kg) Adjusted Body Weight:   Vital Signs: Temp: 98.9 F (37.2 C) (04/19 0900) Temp Source: Oral (04/19 0900) BP: 111/56 mmHg (04/19 0900) Pulse Rate: 90 (04/19 0900) Intake/Output from previous day: 04/18 0701 - 04/19 0700 In: 886.3 [P.O.:60; I.V.:826.3] Out: -  Intake/Output from this shift: Total I/O In: -  Out: 800 [Urine:800]  Labs:  Recent Labs  06/28/14 1931 06/29/14 0605  WBC 13.2* 10.5  HGB 14.0 11.8*  PLT 192 152  CREATININE 1.90* 1.55*   Estimated Creatinine Clearance: 26 mL/min (by C-G formula based on Cr of 1.55). No results for input(s): VANCOTROUGH, VANCOPEAK, VANCORANDOM, GENTTROUGH, GENTPEAK, GENTRANDOM, TOBRATROUGH, TOBRAPEAK, TOBRARND, AMIKACINPEAK, AMIKACINTROU, AMIKACIN in the last 72 hours.   Microbiology: No results found for this or any previous visit (from the past 720 hour(s)).  Medical History: Past Medical History  Diagnosis Date  . Allergy   . Hypertension   . Hyperlipidemia   . GERD (gastroesophageal reflux disease)   . Asthma   . Hx of varicella   . History of positive PPD     gets c xray screen  . Dementia     Medications:  Prescriptions prior to admission  Medication Sig Dispense Refill Last Dose  . aspirin 81 MG EC tablet Take 81 mg by mouth daily.     06/28/2014 at Unknown time  . donepezil (ARICEPT) 10 MG tablet TAKE 1 TABLET BY MOUTH AT BEDTIME AS NEEDED (Patient taking differently: TAKE 1 TABLET BY MOUTH AT BEDTIME.) 90 tablet 0 06/27/2014 at Unknown time  . hydrochlorothiazide (MICROZIDE) 12.5 MG capsule TAKE 1 CAPSULE (12.5 MG TOTAL) BY MOUTH DAILY. 30 capsule 5 06/28/2014 at Unknown time  . Multiple Vitamins-Minerals (CENTRUM SILVER PO) Take 1 tablet by  mouth daily.    06/28/2014 at Unknown time  . simvastatin (ZOCOR) 40 MG tablet TAKE 1 TABLET BY MOUTH AT BEDTIME 90 tablet 2 06/27/2014 at Unknown time  . traMADol (ULTRAM) 50 MG tablet Take 1 tablet (50 mg total) by mouth at bedtime as needed. (Patient taking differently: Take 50 mg by mouth at bedtime as needed for moderate pain. ) 30 tablet 5 06/28/2014 at 2343   Scheduled:  . azithromycin  500 mg Intravenous Q24H  . cefTRIAXone (ROCEPHIN)  IV  1 g Intravenous Q24H  . enoxaparin (LOVENOX) injection  30 mg Subcutaneous Q24H  . guaiFENesin  600 mg Oral BID  . ipratropium-albuterol  3 mL Nebulization Q6H  . methylPREDNISolone (SOLU-MEDROL) injection  60 mg Intravenous Q24H  . polyethylene glycol  17 g Oral Daily  . senna-docusate  2 tablet Oral BID   Infusions:     Assessment: 81yo female with history of dementia and asthma presents with fever and cough. Pharmacy is consulted to dose vancomycin for suspected pneumonia. Pt is afebrile, WBC 10.5, sCr 1.6, LA 3 > 1.3. Per Dr. Erlinda Hong, patient has decompensated.  Pt was started on CTX/Azith in the ED.  Goal of Therapy:  Vancomycin trough level 15-20 mcg/ml  Plan:  Vancomycin 750mg  IV q24h Continue CTX/Azith daily Expected duration 7 days with resolution of temperature and/or normalization of WBC Measure antibiotic drug levels at steady state Follow up culture results, renal function, and clinical course F/u on narrowing antibiotics  Andrey Cota.  Diona Foley, PharmD Clinical Pharmacist Pager [phone] 06/29/2014,4:29 PM

## 2014-06-30 ENCOUNTER — Inpatient Hospital Stay (HOSPITAL_COMMUNITY): Payer: Medicare Other

## 2014-06-30 DIAGNOSIS — I472 Ventricular tachycardia: Secondary | ICD-10-CM

## 2014-06-30 LAB — COMPREHENSIVE METABOLIC PANEL
ALBUMIN: 2.3 g/dL — AB (ref 3.5–5.2)
ALK PHOS: 91 U/L (ref 39–117)
ALT: 25 U/L (ref 0–35)
ANION GAP: 13 (ref 5–15)
AST: 30 U/L (ref 0–37)
BILIRUBIN TOTAL: 0.7 mg/dL (ref 0.3–1.2)
BUN: 26 mg/dL — AB (ref 6–23)
CHLORIDE: 99 mmol/L (ref 96–112)
CO2: 26 mmol/L (ref 19–32)
CREATININE: 1.88 mg/dL — AB (ref 0.50–1.10)
Calcium: 8.1 mg/dL — ABNORMAL LOW (ref 8.4–10.5)
GFR, EST AFRICAN AMERICAN: 28 mL/min — AB (ref >90)
GFR, EST NON AFRICAN AMERICAN: 24 mL/min — AB (ref >90)
GLUCOSE: 218 mg/dL — AB (ref 70–99)
POTASSIUM: 3.5 mmol/L (ref 3.5–5.1)
Sodium: 138 mmol/L (ref 135–145)
Total Protein: 6.6 g/dL (ref 6.0–8.3)

## 2014-06-30 LAB — CBC
HCT: 36.4 % (ref 36.0–46.0)
Hemoglobin: 12.1 g/dL (ref 12.0–15.0)
MCH: 28.8 pg (ref 26.0–34.0)
MCHC: 33.2 g/dL (ref 30.0–36.0)
MCV: 86.7 fL (ref 78.0–100.0)
Platelets: 165 10*3/uL (ref 150–400)
RBC: 4.2 MIL/uL (ref 3.87–5.11)
RDW: 13.8 % (ref 11.5–15.5)
WBC: 12.9 10*3/uL — ABNORMAL HIGH (ref 4.0–10.5)

## 2014-06-30 LAB — LEGIONELLA ANTIGEN, URINE

## 2014-06-30 LAB — PROCALCITONIN: Procalcitonin: 3.46 ng/mL

## 2014-06-30 LAB — TROPONIN I: TROPONIN I: 0.06 ng/mL — AB (ref <0.031)

## 2014-06-30 LAB — MAGNESIUM: Magnesium: 1.7 mg/dL (ref 1.5–2.5)

## 2014-06-30 LAB — URINE CULTURE
CULTURE: NO GROWTH
Colony Count: NO GROWTH

## 2014-06-30 LAB — PHOSPHORUS: Phosphorus: 2.9 mg/dL (ref 2.3–4.6)

## 2014-06-30 LAB — HEMOGLOBIN A1C
HEMOGLOBIN A1C: 6 % — AB (ref 4.8–5.6)
MEAN PLASMA GLUCOSE: 126 mg/dL

## 2014-06-30 MED ORDER — CETYLPYRIDINIUM CHLORIDE 0.05 % MT LIQD
7.0000 mL | Freq: Two times a day (BID) | OROMUCOSAL | Status: DC
  Administered 2014-06-30 – 2014-07-04 (×5): 7 mL via OROMUCOSAL

## 2014-06-30 MED ORDER — DONEPEZIL HCL 10 MG PO TABS
10.0000 mg | ORAL_TABLET | Freq: Every day | ORAL | Status: DC
  Administered 2014-06-30 – 2014-07-03 (×4): 10 mg via ORAL
  Filled 2014-06-30 (×6): qty 1

## 2014-06-30 NOTE — Progress Notes (Addendum)
Name: Kelly Conrad MRN: [id number] DOB: 03/29/1931    ADMISSION DATE:  06/28/2014 CONSULTATION DATE:  06/30/2014  REFERRING MD :  Erlinda Hong  CHIEF COMPLAINT:  Dyspnea  BRIEF PATIENT DESCRIPTION: 81 y.o. F  brought to [facility] ED 4/18 with CAP.  Given fluids for elevated lactate and developed increased WOB due to pulmonary edema and progression of right pleural effusion along with hypoxia.  PCCM consulted 4/19 for increased WOB.  SIGNIFICANT EVENTS  4/18 - admit 4/19 - PCCM consulted, transferred to SDU, code status changed to DNR / DNI  STUDIES:  CXR 4/18 >>> density in b/l lower lobes, right effusion CXR 4/19 >>> cardiomegaly with vascular congestion, progression of right effusion and RLL airspace disease.   HISTORY OF PRESENT ILLNESS:  Kelly Conrad is a 81 y.o. F with PMH as outlined below.  She presented to [facility] ED 4/18 with one day hx of fever and cough.  CXR revealed findings c/w CAP.  She was admitted by [facility] and started on abx. On 4/19, she developed increased WOB and hypoxia.  She had been undergoing IVF resuscitation for mildly elevated lactate.  She complained of difficulty breathing and wheezing.  Denied chest pain, N/V/D, abd pain.  No fevers since admission.  She was initially full code; however, after discussion with Dr. Nelda Marseille, she made it clear that she would never want to be resuscitated or placed on mechanical ventilation.  She answered all orientation questions appropriately and was able to give name and address of her HCPOA.  She verbalized her wishes to be DNR in front of [place], [place].   SUBJECTIVE:  Afebrile oob to chair Denies dyspnea  VITAL SIGNS: Temp:  [97.7 F (36.5 C)-98.9 F (37.2 C)] 98.9 F (37.2 C) (04/20 1130) Pulse Rate:  [66-93] 78 (04/20 0800) Resp:  [20-37] 29 (04/20 0800) BP: (103-121)/(37-56) 112/49 mmHg (04/20 0800) SpO2:  [93 %-98 %] 95 % (04/20 0857) FiO2 (%):  [40 %] 40 % (04/20 0105)  PHYSICAL EXAMINATION: General: Elderly appearing female, resting  in bed, in no respiratory distress. Neuro: A&O x 3, non-focal.  HEENT: [place]/AT. PERRL, sclerae anicteric. Cardiovascular: RRR, no M/R/G.  Lungs: Respirations shallow and mildly labored,  crackles left base Abdomen: BS x 4, soft, NT/ND.  Musculoskeletal: No gross deformities, no edema.  Skin: Intact, warm, no rashes.    Recent Labs Lab 06/28/14 1931 06/29/14 0605 06/30/14 0411  NA 136 137 138  K 3.6 3.1* 3.5  CL 98 104 99  CO2 25 22 26   BUN 24* 21 26*  CREATININE 1.90* 1.55* 1.88*  GLUCOSE 168* 112* 218*    Recent Labs Lab 06/28/14 1931 06/29/14 0605 06/30/14 0411  HGB 14.0 11.8* 12.1  HCT 41.9 35.0* 36.4  WBC 13.2* 10.5 12.9*  PLT 192 152 165   Dg Chest Port 1 View  06/30/2014   CLINICAL DATA:  Shortness of breath and cough  EXAM: PORTABLE CHEST - 1 VIEW  COMPARISON:  06/29/2014  FINDINGS: Persistent elevation of the right diaphragm with bandlike atelectasis. The could be subpulmonic effusion and obscured consolidation.  The left lung is well aerated.  Unchanged cardiomegaly.  Aortic and hilar contours are negative.  IMPRESSION: Right basilar atelectasis which is increased from admission but stable from yesterday. There could be underlying pneumonia or subpulmonic effusion.   Electronically Signed   By: Monte Fantasia M.D.   On: 06/30/2014 06:34   Dg Chest Port 1 View  06/29/2014   CLINICAL DATA:  Short of breath 06/28/2014  EXAM: PORTABLE CHEST - 1 VIEW  COMPARISON:  None.  FINDINGS: Cardiac enlargement with vascular congestion. Progression of right pleural effusion which is now moderately large. Compressive atelectasis right lower lobe also has progressed. Mild left lower lobe atelectasis unchanged.  IMPRESSION: Cardiac enlargement with vascular congestion.  Progression of right effusion and right lower lobe airspace disease. Findings suggest either heart failure or pneumonia.   Electronically Signed   By: Franchot Gallo M.D.   On: 06/29/2014 16:40   Dg Chest Port 1  View  06/28/2014   CLINICAL DATA:  Right-sided flank pain beginning today. Hypertension and asthma.  EXAM: PORTABLE CHEST - 1 VIEW  COMPARISON:  10/04/2010  FINDINGS: The heart is enlarged. There is abnormal density in both lower lobes consistent with atelectasis/ pneumonia. The upper lungs are clear except for chronic pleural and parenchymal scarring. No acute bony finding.  IMPRESSION: Abnormal density in both lower lobes consistent with atelectasis/pneumonia.   Electronically Signed   By: Nelson Chimes M.D.   On: 06/28/2014 23:27    ASSESSMENT / PLAN:  Acute hypoxic respiratory failure RLL plate like atelectasis Pulmonary edema / fluid overload CAP vs aspiration pneumonia R/o flu, doubt edema - BNP low DO NOT INTUBATE / RESUSCITATE   Recs: Transfer to SDU. Continue supplemental O2 as needed to maintain SpO2 > 90%. DuoNebs scheduled / Albuterol PRN. BiPAP PRN. Dc Lasix  Dys-1 diet Continue empiric abx (Ceftriaxone, Azithromycin, Vancomycin). F/u on cultures & simplify abx  Rest per primary team. PCCM to sign off, If does not require bipap daytime- can transfer to tele  Rigoberto Noel. MD  06/30/2014, 12:06 PM

## 2014-06-30 NOTE — Progress Notes (Signed)
Echocardiogram 2D Echocardiogram has been performed.  Kelly Conrad 06/30/2014, 10:25 AM

## 2014-06-30 NOTE — Progress Notes (Signed)
CRITICAL VALUE ALERT  Critical value received:  Blood culture:  GRAM NEGATIVE ORGANISM in the aerobic bottle. will know more about the organism tomorrow.  Date of notification:  06/30/14  Time of notification:  5396  Critical value read back:Yes.    Nurse who received alert:  Carla Drape, RN  MD notified (1st page):  Dr. Broadus John   Time of first page:  [phone]    MD notified (2nd page):  Time of second page:  Responding MD:  Dr. Broadus John  Time MD responded:  [phone]

## 2014-06-30 NOTE — Progress Notes (Signed)
Speech Language Pathology Treatment: Dysphagia  Patient Details Name: Kelly Conrad MRN: [id number] DOB: [birth year] Today's Date: 06/30/2014 Time: 7014-1030 SLP Time Calculation (min) (ACUTE ONLY): 17 min  Assessment / Plan / Recommendation Clinical Impression  F/u after yesterday's clinical swallow evaluation.  Yesterday pm, pt became diaphoretic and tachypneic; Rapid Response called and pt was transferred to SDU and placed on BiPaP.  Improved today - tolerating nasal cannula.  HCPOA present; educated pt and family re: coordination of swallow/breathing and basic precautions to maximize airway protection when eating. Continues to present with + s/s of potential penetration/aspiration when consuming thin liquids.  Recommend continuing Dysphagia 1, nectars for now; SLP will follow for safety/readiness for diet advancement.      HPI HPI: 81 yo female with CAP and likely early sepsis. PMH significant for GERD, asthma, and dementia.   Pertinent Vitals Pain Assessment: No/denies pain  SLP Plan  Continue with current plan of care    Recommendations Diet recommendations: Dysphagia 1 (puree);Nectar-thick liquid Liquids provided via: Cup Medication Administration: Crushed with puree Supervision: Patient able to self feed;Full supervision/cueing for compensatory strategies Compensations: Slow rate;Small sips/bites Postural Changes and/or Swallow Maneuvers: Seated upright 90 degrees;Upright 30-60 min after meal              Oral Care Recommendations: Oral care BID Follow up Recommendations: None Plan: Continue with current plan of care   Kelly Conrad, [place] CCC/SLP Pager [phone]      Kelly Conrad 06/30/2014, 11:05 AM

## 2014-06-30 NOTE — Progress Notes (Signed)
CRITICAL VALUE ALERT  Critical value received:  Troponin: 0.06  Date of notification:  06/30/2014  Time of notification:  6812  Critical value read back:Yes.    Nurse who received alert:  Corinda Gubler, RN  MD notified (1st page):  Dr. Hal Hope  Time of first page:  [phone]  Responding MD:  Dr. Hal Hope  Time MD responded:  [phone]

## 2014-06-30 NOTE — Clinical Social Work Note (Signed)
CSW Consult Acknowledged:   CSW received a consult to assist the pt with pregnancy medicaid. CSW paged the MD to clarify the consult.   [place], MSW, [place]

## 2014-06-30 NOTE — Progress Notes (Signed)
PROGRESS NOTE  Kelly Conrad ATF:[id number] DOB: 09-12-1931 DOA: 06/28/2014 PCP: Lottie Dawson, MD  HPI/Recap of past 24 hours: Breathing ok, Resp rate fluctuates  Assessment/Plan: Acute hypoxic resp failure -due to CAP and Diastolic CHF from fluid resuscitation -improving still with intermittent distress -got multiple lasix doses yesterday, now off -ECHO with normal EF and wall motion -resume lasix tomorrow -PCCM following  Community acquired pneumonia -continue rocephin/zithromax and Vanc added yesterday -FU cultures and de-escalate Abx soon  Acute diastolic CHF -due to volume resuscitation -improving, ECHO with normal EF  Sepsis:  -with fever/leukocytosis/lactic acidosis on admission -improving  ARF on CKF III -cr 1.9 on admission, Renal dosing meds. -now 1.88 monitor  Mild Elevated fasting blood sugar, -no prior diagnosis of DM2, hbaic 6.0  H/o Dementia: mild, has been on aricept for about a year, AAOX3, but impaired memory  DVT proph: lovenox  Code Status: full Family Communication: none at bedside Disposition Plan: to stepdown    Consultants:  none  Procedures:  none  Antibiotics:  Rocephin/zithro from 4/18  vanc from 4/19   Objective: BP 112/51 mmHg  Pulse 86  Temp(Src) 98.9 F (37.2 C) (Oral)  Resp 31  Ht 5\' 2"  (1.575 m)  Wt 74.571 kg (164 lb 6.4 oz)  SpO2 96%  Intake/Output Summary (Last 24 hours) at 06/30/14 1403 Last data filed at 06/30/14 1100  Gross per 24 hour  Intake    750 ml  Output   3700 ml  Net  -2950 ml   Filed Weights   06/28/14 2230 06/29/14 0106  Weight: 70.761 kg (156 lb) 74.571 kg (164 lb 6.4 oz)    Exam:   General:  Increased work of breathing, not able to finish full sentences, denies chest pain.  Cardiovascular: RRR  Respiratory: diffuse wheezing, worse on the right side   Abdomen: Soft/ND/NT, positive BS  Musculoskeletal: No Edema  Neuro: AAOx3, impaired memory  Data Reviewed: Basic  Metabolic Panel:  Recent Labs Lab 06/28/14 1931 06/29/14 0605 06/30/14 0411  NA 136 137 138  K 3.6 3.1* 3.5  CL 98 104 99  CO2 25 22 26   GLUCOSE 168* 112* 218*  BUN 24* 21 26*  CREATININE 1.90* 1.55* 1.88*  CALCIUM 8.6 7.2* 8.1*  MG  --   --  1.7  PHOS  --   --  2.9   Liver Function Tests:  Recent Labs Lab 06/28/14 1931 06/30/14 0411  AST 50* 30  ALT 33 25  ALKPHOS 102 91  BILITOT 1.3* 0.7  PROT 7.3 6.6  ALBUMIN 3.3* 2.3*   No results for input(s): LIPASE, AMYLASE in the last 168 hours. No results for input(s): AMMONIA in the last 168 hours. CBC:  Recent Labs Lab 06/28/14 1931 06/29/14 0605 06/30/14 0411  WBC 13.2* 10.5 12.9*  NEUTROABS 11.7* 9.0*  --   HGB 14.0 11.8* 12.1  HCT 41.9 35.0* 36.4  MCV 87.8 87.1 86.7  PLT 192 152 165   Cardiac Enzymes:    Recent Labs Lab 06/29/14 1908 06/29/14 2232 06/30/14 0411  TROPONINI 0.17* 0.04* 0.06*   BNP (last 3 results)  Recent Labs  06/29/14 1908  BNP 353.9*    ProBNP (last 3 results) No results for input(s): PROBNP in the last 8760 hours.  CBG:  Recent Labs Lab 06/29/14 1602  GLUCAP 150*    Recent Results (from the past 240 hour(s))  Urine culture     Status: None   Collection Time: 06/28/14  7:28 PM  Result  Value Ref Range Status   Specimen Description URINE, CLEAN CATCH  Final   Special Requests NONE  Final   Colony Count NO GROWTH Performed at [facility]   Final   Culture NO GROWTH Performed at [facility]   Final   Report Status 06/30/2014 FINAL  Final  Blood Culture (routine x 2)     Status: None (Preliminary result)   Collection Time: 06/28/14 10:40 PM  Result Value Ref Range Status   Specimen Description BLOOD LEFT WRIST  Final   Special Requests BOTTLES DRAWN AEROBIC AND ANAEROBIC 6CC  Final   Culture   Final           BLOOD CULTURE RECEIVED NO GROWTH TO DATE CULTURE WILL BE HELD FOR 5 DAYS BEFORE ISSUING A FINAL NEGATIVE REPORT Performed at [facility]    Report Status PENDING  Incomplete  Blood Culture (routine x 2)     Status: None (Preliminary result)   Collection Time: 06/28/14 10:42 PM  Result Value Ref Range Status   Specimen Description BLOOD RIGHT HAND  Final   Special Requests BOTTLES DRAWN AEROBIC ONLY 4CC  Final   Culture   Final           BLOOD CULTURE RECEIVED NO GROWTH TO DATE CULTURE WILL BE HELD FOR 5 DAYS BEFORE ISSUING A FINAL NEGATIVE REPORT Performed at [facility]    Report Status PENDING  Incomplete  MRSA PCR Screening     Status: None   Collection Time: 06/29/14  6:30 PM  Result Value Ref Range Status   MRSA by PCR NEGATIVE NEGATIVE Final    Comment:        The GeneXpert MRSA Assay (FDA approved for NASAL specimens only), is one component of a comprehensive MRSA colonization surveillance program. It is not intended to diagnose MRSA infection nor to guide or monitor treatment for MRSA infections.      Studies: Dg Chest Port 1 View  06/29/2014   CLINICAL DATA:  Short of breath 06/28/2014  EXAM: PORTABLE CHEST - 1 VIEW  COMPARISON:  None.  FINDINGS: Cardiac enlargement with vascular congestion. Progression of right pleural effusion which is now moderately large. Compressive atelectasis right lower lobe also has progressed. Mild left lower lobe atelectasis unchanged.  IMPRESSION: Cardiac enlargement with vascular congestion.  Progression of right effusion and right lower lobe airspace disease. Findings suggest either heart failure or pneumonia.   Electronically Signed   By: Franchot Gallo M.D.   On: 06/29/2014 16:40    Scheduled Meds: . antiseptic oral rinse  7 mL Mouth Rinse BID  . azithromycin  500 mg Intravenous Q24H  . cefTRIAXone (ROCEPHIN)  IV  1 g Intravenous Q24H  . donepezil  10 mg Oral QHS  . enoxaparin (LOVENOX) injection  30 mg Subcutaneous Q24H  . guaiFENesin  600 mg Oral BID  . ipratropium-albuterol  3 mL Nebulization Q6H  . polyethylene glycol  17 g Oral Daily  .  senna-docusate  2 tablet Oral BID    Continuous Infusions:     Time spent: >4mins  Oreste Majeed MD [facility] Pager [phone]. If 7PM-7AM, please contact night-coverage at [email or web address], password [facility] 06/30/2014, 2:03 PM  LOS: 2 days

## 2014-07-01 LAB — URINE CULTURE
CULTURE: NO GROWTH
Colony Count: NO GROWTH

## 2014-07-01 LAB — BASIC METABOLIC PANEL
Anion gap: 15 (ref 5–15)
BUN: 47 mg/dL — AB (ref 6–23)
CALCIUM: 8.4 mg/dL (ref 8.4–10.5)
CHLORIDE: 100 mmol/L (ref 96–112)
CO2: 26 mmol/L (ref 19–32)
Creatinine, Ser: 1.78 mg/dL — ABNORMAL HIGH (ref 0.50–1.10)
GFR calc Af Amer: 30 mL/min — ABNORMAL LOW (ref >90)
GFR calc non Af Amer: 26 mL/min — ABNORMAL LOW (ref >90)
GLUCOSE: 176 mg/dL — AB (ref 70–99)
Potassium: 3.2 mmol/L — ABNORMAL LOW (ref 3.5–5.1)
Sodium: 141 mmol/L (ref 135–145)

## 2014-07-01 LAB — CBC
HEMATOCRIT: 37.8 % (ref 36.0–46.0)
HEMOGLOBIN: 13.1 g/dL (ref 12.0–15.0)
MCH: 29.7 pg (ref 26.0–34.0)
MCHC: 34.7 g/dL (ref 30.0–36.0)
MCV: 85.7 fL (ref 78.0–100.0)
Platelets: 240 10*3/uL (ref 150–400)
RBC: 4.41 MIL/uL (ref 3.87–5.11)
RDW: 14.1 % (ref 11.5–15.5)
WBC: 19.3 10*3/uL — AB (ref 4.0–10.5)

## 2014-07-01 LAB — INFLUENZA VIRUS AG, A+B (DFA)

## 2014-07-01 LAB — PROCALCITONIN: Procalcitonin: 2.47 ng/mL

## 2014-07-01 MED ORDER — FUROSEMIDE 20 MG PO TABS
20.0000 mg | ORAL_TABLET | Freq: Every day | ORAL | Status: DC
  Administered 2014-07-01 – 2014-07-04 (×4): 20 mg via ORAL
  Filled 2014-07-01 (×5): qty 1

## 2014-07-01 MED ORDER — POTASSIUM CHLORIDE CRYS ER 20 MEQ PO TBCR
40.0000 meq | EXTENDED_RELEASE_TABLET | Freq: Once | ORAL | Status: AC
  Administered 2014-07-01: 40 meq via ORAL
  Filled 2014-07-01: qty 2

## 2014-07-01 MED ORDER — LEVALBUTEROL HCL 0.63 MG/3ML IN NEBU: 0.6300 mg | INHALATION_SOLUTION | RESPIRATORY_TRACT | Status: DC | PRN

## 2014-07-01 MED ORDER — POTASSIUM CHLORIDE CRYS ER 20 MEQ PO TBCR
20.0000 meq | EXTENDED_RELEASE_TABLET | Freq: Every day | ORAL | Status: DC
Start: 2014-07-01 — End: 2014-07-04
  Administered 2014-07-01 – 2014-07-04 (×4): 20 meq via ORAL
  Filled 2014-07-01 (×4): qty 1

## 2014-07-01 MED ORDER — DEXTROSE 5 % IV SOLN
500.0000 mg | INTRAVENOUS | Status: DC
  Administered 2014-07-01 – 2014-07-02 (×2): 500 mg via INTRAVENOUS
  Filled 2014-07-01 (×4): qty 0.5

## 2014-07-01 NOTE — Progress Notes (Addendum)
Report called to nurse for [phone]. Patient transferred.

## 2014-07-01 NOTE — Care Management Note (Signed)
    Page 1 of 2   07/05/2014     3:05:43 PM CARE MANAGEMENT NOTE 07/05/2014  Patient:  Kelly Conrad, Kelly Conrad   Account Number:  [id number]  Date Initiated:  06/30/2014  Documentation initiated by:  [facility]  Subjective/Objective Assessment:   Admitted with fever and cough -     Action/Plan:   dc to home with PT   Anticipated DC Date:  07/02/2014   Anticipated DC Plan:  [place]  CM consult      [facility] Choice  HOME HEALTH   Choice offered to / List presented to:  C-1 Patient        [place] arranged  [place] PT      [place].   Status of service:  Completed, signed off Medicare Important Message given?  YES (If response is "NO", the following Medicare IM given date fields will be blank) Date Medicare IM given:  07/01/2014 Medicare IM given by:  [facility] Date Additional Medicare IM given:   Additional Medicare IM given by:    Discharge Disposition:  [place]  Per UR Regulation:  Reviewed for med. necessity/level of care/duration of stay  If discussed at [place] of Stay Meetings, dates discussed:    Comments:  07-01-04 [facility] PT set up with [facility]. Miranda notified 16:40. Carles Collet RN BSN CM  ContactMorey Hummingbird [phone]  [phone]  07-01-14 11:55am Luz Lex, [place] above listed contact.  Mr. [name] lives with patient as her POA, and caregiver and has for 21 years.  Plans for same on discharge.  CM will continue to follow.

## 2014-07-01 NOTE — Progress Notes (Signed)
Patient attempted to get OOB without assistance. Monitor leads removed by patient and IV removed by patient accidentally.  I assisted patient from side of bed to bedside commode, she required only setup assist.  Will continue to monitor closely.  IV to be replaced after she eats breakfast.

## 2014-07-01 NOTE — Progress Notes (Addendum)
PROGRESS NOTE  Kelly Conrad CBS:[id number] DOB: 09/21/1931 DOA: 06/28/2014 PCP: Lottie Dawson, MD  HPI/Recap of past 24 hours: Breathing improving, some cough  Assessment/Plan: Acute hypoxic resp failure -due to CAP and Diastolic CHF from fluid resuscitation -improving,  still with intermittent distress -got multiple lasix doses 4/19, now off -ECHO with normal EF and wall motion -resume lasix today -PCCM signed off  Community acquired pneumonia -add cefepime, continue zithromax,  -Blood Cx with Gram negative organism -repeat blood cx  Acute diastolic CHF -due to volume resuscitation -improving, ECHO with normal EF  Sepsis:  -with fever/leukocytosis/lactic acidosis on admission -improving  ARF on CKF III -cr 1.9 on admission, Renal dosing meds. -now 1.88 monitor  Mild Elevated fasting blood sugar, -no prior diagnosis of DM2, hbaic 6.0  H/o Dementia: mild, has been on aricept for about a year, AAOX3, but impaired memory  DVT proph: lovenox  Code Status: full Family Communication: none at bedside Disposition Plan: tx to tele   Consultants:  none  Procedures:  none  Antibiotics:  Rocephin/zithro from 4/18  vanc from 4/19   Objective: BP 136/59 mmHg  Pulse 91  Temp(Src) 99.3 F (37.4 C) (Oral)  Resp 33  Ht 5\' 2"  (1.575 m)  Wt 74.571 kg (164 lb 6.4 oz)  SpO2 96%  Intake/Output Summary (Last 24 hours) at 07/01/14 1339 Last data filed at 07/01/14 0900  Gross per 24 hour  Intake    660 ml  Output      0 ml  Net    660 ml   Filed Weights   06/28/14 2230 06/29/14 0106  Weight: 70.761 kg (156 lb) 74.571 kg (164 lb 6.4 oz)    Exam:   General:  Increased work of breathing, not able to finish full sentences, denies chest pain.  Cardiovascular: RRR  Respiratory: diffuse wheezing, worse on the right side   Abdomen: Soft/ND/NT, positive BS  Musculoskeletal: No Edema  Neuro: AAOx3, impaired memory  Data Reviewed: Basic Metabolic  Panel:  Recent Labs Lab 06/28/14 1931 06/29/14 0605 06/30/14 0411 07/01/14 0050  NA 136 137 138 141  K 3.6 3.1* 3.5 3.2*  CL 98 104 99 100  CO2 25 22 26 26   GLUCOSE 168* 112* 218* 176*  BUN 24* 21 26* 47*  CREATININE 1.90* 1.55* 1.88* 1.78*  CALCIUM 8.6 7.2* 8.1* 8.4  MG  --   --  1.7  --   PHOS  --   --  2.9  --    Liver Function Tests:  Recent Labs Lab 06/28/14 1931 06/30/14 0411  AST 50* 30  ALT 33 25  ALKPHOS 102 91  BILITOT 1.3* 0.7  PROT 7.3 6.6  ALBUMIN 3.3* 2.3*   No results for input(s): LIPASE, AMYLASE in the last 168 hours. No results for input(s): AMMONIA in the last 168 hours. CBC:  Recent Labs Lab 06/28/14 1931 06/29/14 0605 06/30/14 0411 07/01/14 0430  WBC 13.2* 10.5 12.9* 19.3*  NEUTROABS 11.7* 9.0*  --   --   HGB 14.0 11.8* 12.1 13.1  HCT 41.9 35.0* 36.4 37.8  MCV 87.8 87.1 86.7 85.7  PLT 192 152 165 240   Cardiac Enzymes:    Recent Labs Lab 06/29/14 1908 06/29/14 2232 06/30/14 0411  TROPONINI 0.17* 0.04* 0.06*   BNP (last 3 results)  Recent Labs  06/29/14 1908  BNP 353.9*    ProBNP (last 3 results) No results for input(s): PROBNP in the last 8760 hours.  CBG:  Recent Labs Lab  06/29/14 1602  GLUCAP 150*    Recent Results (from the past 240 hour(s))  Urine culture     Status: None   Collection Time: 06/28/14  7:28 PM  Result Value Ref Range Status   Specimen Description URINE, CLEAN CATCH  Final   Special Requests NONE  Final   Colony Count NO GROWTH Performed at [facility]   Final   Culture NO GROWTH Performed at [facility]   Final   Report Status 06/30/2014 FINAL  Final  Blood Culture (routine x 2)     Status: None (Preliminary result)   Collection Time: 06/28/14 10:40 PM  Result Value Ref Range Status   Specimen Description BLOOD LEFT WRIST  Final   Special Requests BOTTLES DRAWN AEROBIC AND ANAEROBIC 6CC  Final   Culture   Final    GRAM NEGATIVE ORGANISM Note: Gram Stain Report  Called to,Read Back By and Verified With: ALBA ASATSAWO @ [id number] ON [id number] BY [place] Performed at [facility]    Report Status PENDING  Incomplete  Blood Culture (routine x 2)     Status: None (Preliminary result)   Collection Time: 06/28/14 10:42 PM  Result Value Ref Range Status   Specimen Description BLOOD RIGHT HAND  Final   Special Requests BOTTLES DRAWN AEROBIC ONLY 4CC  Final   Culture   Final           BLOOD CULTURE RECEIVED NO GROWTH TO DATE CULTURE WILL BE HELD FOR 5 DAYS BEFORE ISSUING A FINAL NEGATIVE REPORT Performed at [facility]    Report Status PENDING  Incomplete  Culture, Urine     Status: None   Collection Time: 06/29/14  5:50 PM  Result Value Ref Range Status   Specimen Description URINE, CATHETERIZED  Final   Special Requests NONE  Final   Colony Count NO GROWTH Performed at [facility]   Final   Culture NO GROWTH Performed at [facility]   Final   Report Status 07/01/2014 FINAL  Final  MRSA PCR Screening     Status: None   Collection Time: 06/29/14  6:30 PM  Result Value Ref Range Status   MRSA by PCR NEGATIVE NEGATIVE Final    Comment:        The GeneXpert MRSA Assay (FDA approved for NASAL specimens only), is one component of a comprehensive MRSA colonization surveillance program. It is not intended to diagnose MRSA infection nor to guide or monitor treatment for MRSA infections.      Studies: Dg Chest Port 1 View  06/30/2014   CLINICAL DATA:  Shortness of breath and cough  EXAM: PORTABLE CHEST - 1 VIEW  COMPARISON:  06/29/2014  FINDINGS: Persistent elevation of the right diaphragm with bandlike atelectasis. The could be subpulmonic effusion and obscured consolidation.  The left lung is well aerated.  Unchanged cardiomegaly.  Aortic and hilar contours are negative.  IMPRESSION: Right basilar atelectasis which is increased from admission but stable from yesterday. There could be underlying pneumonia or subpulmonic  effusion.   Electronically Signed   By: Monte Fantasia M.D.   On: 06/30/2014 06:34    Scheduled Meds: . antiseptic oral rinse  7 mL Mouth Rinse BID  . azithromycin  500 mg Intravenous Q24H  . cefTRIAXone (ROCEPHIN)  IV  1 g Intravenous Q24H  . donepezil  10 mg Oral QHS  . enoxaparin (LOVENOX) injection  30 mg Subcutaneous Q24H  . guaiFENesin  600 mg Oral BID  .  ipratropium-albuterol  3 mL Nebulization Q6H  . polyethylene glycol  17 g Oral Daily  . senna-docusate  2 tablet Oral BID    Continuous Infusions:     Time spent: >71mins  Laterra Lubinski MD [facility] Pager [phone]. If 7PM-7AM, please contact night-coverage at [email or web address], password [facility] 07/01/2014, 1:39 PM  LOS: 3 days

## 2014-07-01 NOTE — Clinical Social Work Note (Signed)
CSW follow-up from the CSW consult placed yesterday. CSW informed the consult was placed in error and has been removed. CSW will sign off.   [place], MSW, [place]

## 2014-07-01 NOTE — Progress Notes (Signed)
Speech Language Pathology Treatment: Dysphagia  Patient Details Name: Kelly Conrad MRN: [id number] DOB: 06/27/1931 Today's Date: 07/01/2014 Time: 3005-1102 SLP Time Calculation (min) (ACUTE ONLY): 13 min  Assessment / Plan / Recommendation Clinical Impression  Pt with improved swallow/respiratory coordination.  No further s/s of aspiration - tolerated regular consistency solid and thin liquids with adequate mastication, consistent swallow response with expiration post-swallow, and no overt s/s of aspiration.  Recommend advancing diet to regular, thin liquids.  No further SLP f/u warranted - will sign off.   HPI HPI: 81 yo female with CAP and likely early sepsis. PMH significant for GERD, asthma, and dementia.   Pertinent Vitals Pain Assessment: No/denies pain  SLP Plan  All goals met    Recommendations Diet recommendations: Regular;Thin liquid Liquids provided via: Cup Medication Administration: Whole meds with liquid Supervision: Patient able to self feed Postural Changes and/or Swallow Maneuvers: Seated upright 90 degrees;Upright 30-60 min after meal              Oral Care Recommendations: Oral care BID Follow up Recommendations: None Plan: All goals met   Kelly Conrad, [place] CCC/SLP Pager [phone]      Kelly Conrad 07/01/2014, 4:21 PM

## 2014-07-01 NOTE — Progress Notes (Signed)
Report received from [place] from 62M for patient to be trans to 5w03

## 2014-07-01 NOTE — Progress Notes (Signed)
Utilization review complete 

## 2014-07-02 DIAGNOSIS — I129 Hypertensive chronic kidney disease with stage 1 through stage 4 chronic kidney disease, or unspecified chronic kidney disease: Secondary | ICD-10-CM

## 2014-07-02 LAB — CBC
HCT: 40.5 % (ref 36.0–46.0)
Hemoglobin: 13.3 g/dL (ref 12.0–15.0)
MCH: 28.9 pg (ref 26.0–34.0)
MCHC: 32.8 g/dL (ref 30.0–36.0)
MCV: 87.9 fL (ref 78.0–100.0)
Platelets: 227 10*3/uL (ref 150–400)
RBC: 4.61 MIL/uL (ref 3.87–5.11)
RDW: 14.4 % (ref 11.5–15.5)
WBC: 11.2 10*3/uL — ABNORMAL HIGH (ref 4.0–10.5)

## 2014-07-02 LAB — BASIC METABOLIC PANEL
ANION GAP: 14 (ref 5–15)
BUN: 39 mg/dL — ABNORMAL HIGH (ref 6–23)
CHLORIDE: 102 mmol/L (ref 96–112)
CO2: 25 mmol/L (ref 19–32)
CREATININE: 1.41 mg/dL — AB (ref 0.50–1.10)
Calcium: 8.8 mg/dL (ref 8.4–10.5)
GFR calc Af Amer: 39 mL/min — ABNORMAL LOW (ref >90)
GFR calc non Af Amer: 34 mL/min — ABNORMAL LOW (ref >90)
Glucose, Bld: 100 mg/dL — ABNORMAL HIGH (ref 70–99)
Potassium: 4.2 mmol/L (ref 3.5–5.1)
SODIUM: 141 mmol/L (ref 135–145)

## 2014-07-02 MED ORDER — IPRATROPIUM-ALBUTEROL 0.5-2.5 (3) MG/3ML IN SOLN
3.0000 mL | Freq: Three times a day (TID) | RESPIRATORY_TRACT | Status: DC
  Administered 2014-07-02 – 2014-07-04 (×6): 3 mL via RESPIRATORY_TRACT
  Filled 2014-07-02 (×7): qty 3

## 2014-07-02 NOTE — Evaluation (Signed)
Physical Therapy Evaluation Patient Details Name: Kelly Conrad MRN: [id number] DOB: 1931-06-26 Today's Date: 07/02/2014   History of Present Illness  Patient is an 81 y/o female admitted with CAP and likely early sepsis. PMH significant for GERD, asthma, and dementia.  Clinical Impression  Patient presents with decreased independence with mobility due to deficits listed in PT problem list.  She will benefit from skilled PT in the acute setting to allow return home with caregiver assist and HHPT.    Follow Up Recommendations Home health PT    Equipment Recommendations  None recommended by PT    Recommendations for Other Services       Precautions / Restrictions Precautions Precautions: Fall Precaution Comments: no h/o falls she reports, but unstable on her feet Restrictions Weight Bearing Restrictions: No      Mobility  Bed Mobility               General bed mobility comments: up in chair  Transfers Overall transfer level: Needs assistance Equipment used: None Transfers: Sit to/from Stand Sit to Stand: Min guard         General transfer comment: stood from chair minguard for safety  Ambulation/Gait Ambulation/Gait assistance: Min guard Ambulation Distance (Feet): 200 Feet Assistive device: None (occasional use of wall rail) Gait Pattern/deviations: Step-through pattern;Shuffle;Decreased stride length;Decreased dorsiflexion - left;Wide base of support     General Gait Details: scuffs left foot on floor, veers from straight path when not using wall rail and needs minguard for safety  Stairs            Wheelchair Mobility    Modified Rankin (Stroke Patients Only)       Balance Overall balance assessment: Needs assistance         Standing balance support: No upper extremity supported Standing balance-Leahy Scale: Fair                               Pertinent Vitals/Pain Pain Assessment: No/denies pain    Home Living  Family/patient expects to be discharged to:: Private residence Living Arrangements: Non-relatives/Friends Available Help at Discharge: Friend(s);Available PRN/intermittently Type of Home: Mobile home Home Access: Stairs to enter;Ramped entrance   Entrance Stairs-Number of Steps: ramp in back Home Layout: One level Home Equipment: Walker - 2 wheels      Prior Function Level of Independence: Independent         Comments: did not drive PTA     Hand Dominance        Extremity/Trunk Assessment               Lower Extremity Assessment: Generalized weakness      Cervical / Trunk Assessment: Kyphotic  Communication   Communication: HOH (hearing aides)  Cognition Arousal/Alertness: Awake/alert Behavior During Therapy: WFL for tasks assessed/performed Overall Cognitive Status: Within Functional Limits for tasks assessed                      General Comments General comments (skin integrity, edema, etc.): SpO2 down to at lest 90 ambulating on room air, initial reading after walking was lower, but did not correlate correct HR.  Respirations up to 26 with audible wheezes.    Exercises        Assessment/Plan    PT Assessment Patient needs continued PT services  PT Diagnosis Generalized weakness;Abnormality of gait   PT Problem List Decreased strength;Decreased activity tolerance;Decreased balance;Decreased mobility;Cardiopulmonary status limiting  activity  PT Treatment Interventions DME instruction;Therapeutic exercise;Gait training;Balance training;Functional mobility training;Therapeutic activities;Patient/family education   PT Goals (Current goals can be found in the Care Plan section) Acute Rehab PT Goals Patient Stated Goal: To return to independent PT Goal Formulation: With patient Time For Goal Achievement: 07/09/14 Potential to Achieve Goals: Good    Frequency Min 3X/week   Barriers to discharge        Co-evaluation               End  of Session Equipment Utilized During Treatment: Gait belt Activity Tolerance: Patient limited by fatigue Patient left: in chair;with call bell/phone within reach           Time: 7414-2395 PT Time Calculation (min) (ACUTE ONLY): 17 min   Charges:   PT Evaluation $Initial PT Evaluation Tier I: 1 Procedure     PT G Codes:        Sayra Frisby,CYNDI 07-27-14, 3:58 PM  Magda Kiel, [place] 07/27/2014

## 2014-07-02 NOTE — Progress Notes (Addendum)
CRITICAL VALUE ALERT  Critical value received:  Flu PCR    Date of notification:  07/02/14    Time of notification:  1330  Critical value read back: YES  Nurse who received alert:  Sheppard Evens, RN  MD notified (1st page):  Fanny Bien.  Time of first page:  75  MD notified (2nd page):    Time of second page:   Responding MD:  Broadus John   Time MD responded:  [phone]

## 2014-07-02 NOTE — Progress Notes (Addendum)
PROGRESS NOTE  Kelly Conrad OBS:[id number] DOB: 1932-01-05 DOA: 06/28/2014 PCP: Lottie Dawson, MD  HPI/Recap of past 24 hours: Breathing nuch better, cough improved  Assessment/Plan: Acute hypoxic resp failure -due to CAP and Diastolic CHF from fluid resuscitation -much improved -got multiple lasix doses 4/19, now off -ECHO with normal EF and wall motion -continue cefepime/zithromax and PO lasix -PCCM signed off  Community acquired pneumonia -added cefepime due to blood cx, continue zithromax,  -Blood Cx with Gram negative organism, call micro for speciation -repeat blood cx   Acute diastolic CHF -due to volume resuscitation -improving, ECHO with normal EF -continue Po lasix  Sepsis:  -with fever/leukocytosis/lactic acidosis on admission -improving  ARF on CKF III -cr 1.9 on admission, Renal dosing meds. -now 1.88 monitor  Hyperglycemia: -no prior diagnosis of DM2, hbaic 6.0  H/o Dementia: mild, has been on aricept for about a year, AAOX3, but impaired memory  DVT proph: lovenox  Code Status: full Family Communication: none at bedside Disposition Plan: tx to tele   Consultants:  none  Procedures:  none  Antibiotics:  Rocephin/zithro from 4/18  vanc from 4/19   Objective: BP 104/68 mmHg  Pulse 78  Temp(Src) 99.2 F (37.3 C) (Oral)  Resp 22  Ht 5\' 2"  (1.575 m)  Wt 74.571 kg (164 lb 6.4 oz)  SpO2 94%  Intake/Output Summary (Last 24 hours) at 07/02/14 0943 Last data filed at 07/02/14 0941  Gross per 24 hour  Intake    720 ml  Output      0 ml  Net    720 ml   Filed Weights   06/28/14 2230 06/29/14 0106  Weight: 70.761 kg (156 lb) 74.571 kg (164 lb 6.4 oz)    Exam:   General:  AAOx3, no distress.  Cardiovascular: RRR  Respiratory: CTAB  Abdomen: Soft/ND/NT, positive BS  Musculoskeletal: No Edema  Neuro: AAOx3, impaired memory  Data Reviewed: Basic Metabolic Panel:  Recent Labs Lab 06/28/14 1931 06/29/14 0605  06/30/14 0411 07/01/14 0050  NA 136 137 138 141  K 3.6 3.1* 3.5 3.2*  CL 98 104 99 100  CO2 25 22 26 26   GLUCOSE 168* 112* 218* 176*  BUN 24* 21 26* 47*  CREATININE 1.90* 1.55* 1.88* 1.78*  CALCIUM 8.6 7.2* 8.1* 8.4  MG  --   --  1.7  --   PHOS  --   --  2.9  --    Liver Function Tests:  Recent Labs Lab 06/28/14 1931 06/30/14 0411  AST 50* 30  ALT 33 25  ALKPHOS 102 91  BILITOT 1.3* 0.7  PROT 7.3 6.6  ALBUMIN 3.3* 2.3*   No results for input(s): LIPASE, AMYLASE in the last 168 hours. No results for input(s): AMMONIA in the last 168 hours. CBC:  Recent Labs Lab 06/28/14 1931 06/29/14 0605 06/30/14 0411 07/01/14 0430 07/02/14 0735  WBC 13.2* 10.5 12.9* 19.3* 11.2*  NEUTROABS 11.7* 9.0*  --   --   --   HGB 14.0 11.8* 12.1 13.1 13.3  HCT 41.9 35.0* 36.4 37.8 40.5  MCV 87.8 87.1 86.7 85.7 87.9  PLT 192 152 165 240 227   Cardiac Enzymes:    Recent Labs Lab 06/29/14 1908 06/29/14 2232 06/30/14 0411  TROPONINI 0.17* 0.04* 0.06*   BNP (last 3 results)  Recent Labs  06/29/14 1908  BNP 353.9*    ProBNP (last 3 results) No results for input(s): PROBNP in the last 8760 hours.  CBG:  Recent Labs Lab  06/29/14 1602  GLUCAP 150*    Recent Results (from the past 240 hour(s))  Urine culture     Status: None   Collection Time: 06/28/14  7:28 PM  Result Value Ref Range Status   Specimen Description URINE, CLEAN CATCH  Final   Special Requests NONE  Final   Colony Count NO GROWTH Performed at [facility]   Final   Culture NO GROWTH Performed at [facility]   Final   Report Status 06/30/2014 FINAL  Final  Blood Culture (routine x 2)     Status: None (Preliminary result)   Collection Time: 06/28/14 10:40 PM  Result Value Ref Range Status   Specimen Description BLOOD LEFT WRIST  Final   Special Requests BOTTLES DRAWN AEROBIC AND ANAEROBIC 6CC  Final   Culture   Final    GRAM NEGATIVE ORGANISM Note: Gram Stain Report Called to,Read  Back By and Verified With: ALBA ASATSAWO @ [id number] ON [id number] BY [place] Performed at [facility]    Report Status PENDING  Incomplete  Blood Culture (routine x 2)     Status: None (Preliminary result)   Collection Time: 06/28/14 10:42 PM  Result Value Ref Range Status   Specimen Description BLOOD RIGHT HAND  Final   Special Requests BOTTLES DRAWN AEROBIC ONLY 4CC  Final   Culture   Final           BLOOD CULTURE RECEIVED NO GROWTH TO DATE CULTURE WILL BE HELD FOR 5 DAYS BEFORE ISSUING A FINAL NEGATIVE REPORT Performed at [facility]    Report Status PENDING  Incomplete  Influenza virus ag, a+b (DFA)     Status: None   Collection Time: 06/29/14  4:52 AM  Result Value Ref Range Status   Influenza Virus A and B Ag REPORT  Final    Comment: (NOTE) Influenza Virus Type A#B Antigen, DFA SOURCE : NOT SUPPLIED Influenza Type A Ag, DFA        Negative for influenza virus Influenza Type B Ag, DFA        Negative for influenza virus For maximum sensitivity in the diagnosis of viral infections, a negative or suspected positive result should be confirmed by viral culture. Performed at [facility], Urine     Status: None   Collection Time: 06/29/14  5:50 PM  Result Value Ref Range Status   Specimen Description URINE, CATHETERIZED  Final   Special Requests NONE  Final   Colony Count NO GROWTH Performed at [facility]   Final   Culture NO GROWTH Performed at [facility]   Final   Report Status 07/01/2014 FINAL  Final  MRSA PCR Screening     Status: None   Collection Time: 06/29/14  6:30 PM  Result Value Ref Range Status   MRSA by PCR NEGATIVE NEGATIVE Final    Comment:        The GeneXpert MRSA Assay (FDA approved for NASAL specimens only), is one component of a comprehensive MRSA colonization surveillance program. It is not intended to diagnose MRSA infection nor to guide or monitor treatment for MRSA infections.   Culture,  blood (routine x 2)     Status: None (Preliminary result)   Collection Time: 07/01/14  2:37 PM  Result Value Ref Range Status   Specimen Description BLOOD RIGHT ARM  Final   Special Requests BOTTLES DRAWN AEROBIC AND ANAEROBIC 10CC  Final   Culture PENDING  Incomplete  Report Status PENDING  Incomplete  Culture, blood (routine x 2)     Status: None (Preliminary result)   Collection Time: 07/01/14  2:48 PM  Result Value Ref Range Status   Specimen Description BLOOD LEFT ARM  Final   Special Requests BOTTLES DRAWN AEROBIC AND ANAEROBIC 10CC  Final   Culture PENDING  Incomplete   Report Status PENDING  Incomplete     Studies: No results found.  Scheduled Meds: . antiseptic oral rinse  7 mL Mouth Rinse BID  . azithromycin  500 mg Intravenous Q24H  . ceFEPime (MAXIPIME) IV  500 mg Intravenous Q24H  . donepezil  10 mg Oral QHS  . enoxaparin (LOVENOX) injection  30 mg Subcutaneous Q24H  . furosemide  20 mg Oral Daily  . guaiFENesin  600 mg Oral BID  . ipratropium-albuterol  3 mL Nebulization TID  . polyethylene glycol  17 g Oral Daily  . potassium chloride  20 mEq Oral Daily  . senna-docusate  2 tablet Oral BID    Continuous Infusions:     Time spent: >29mins  Deliah Strehlow MD [facility] Pager [phone]. If 7PM-7AM, please contact night-coverage at [email or web address], password [facility] 07/02/2014, 9:43 AM  LOS: 4 days

## 2014-07-03 DIAGNOSIS — I10 Essential (primary) hypertension: Secondary | ICD-10-CM

## 2014-07-03 LAB — BASIC METABOLIC PANEL
Anion gap: 11 (ref 5–15)
BUN: 34 mg/dL — ABNORMAL HIGH (ref 6–23)
CO2: 28 mmol/L (ref 19–32)
Calcium: 8.8 mg/dL (ref 8.4–10.5)
Chloride: 102 mmol/L (ref 96–112)
Creatinine, Ser: 1.39 mg/dL — ABNORMAL HIGH (ref 0.50–1.10)
GFR, EST AFRICAN AMERICAN: 40 mL/min — AB (ref >90)
GFR, EST NON AFRICAN AMERICAN: 35 mL/min — AB (ref >90)
Glucose, Bld: 116 mg/dL — ABNORMAL HIGH (ref 70–99)
POTASSIUM: 4.6 mmol/L (ref 3.5–5.1)
Sodium: 141 mmol/L (ref 135–145)

## 2014-07-03 LAB — CBC
HCT: 38.6 % (ref 36.0–46.0)
Hemoglobin: 12.5 g/dL (ref 12.0–15.0)
MCH: 28.4 pg (ref 26.0–34.0)
MCHC: 32.4 g/dL (ref 30.0–36.0)
MCV: 87.7 fL (ref 78.0–100.0)
PLATELETS: 245 10*3/uL (ref 150–400)
RBC: 4.4 MIL/uL (ref 3.87–5.11)
RDW: 14.5 % (ref 11.5–15.5)
WBC: 13 10*3/uL — ABNORMAL HIGH (ref 4.0–10.5)

## 2014-07-03 MED ORDER — LEVOFLOXACIN 500 MG PO TABS
500.0000 mg | ORAL_TABLET | ORAL | Status: DC
  Administered 2014-07-03: 500 mg via ORAL
  Filled 2014-07-03: qty 1

## 2014-07-03 NOTE — Evaluation (Signed)
Occupational Therapy Evaluation Patient Details Name: Kelly Conrad MRN: [id number] DOB: November 20, 1931 Today's Date: 07/03/2014    History of Present Illness Patient is an 81 y/o female admitted with CAP and likely early sepsis. PMH significant for GERD, asthma, and dementia.   Clinical Impression   Pt admitted with above. Feel pt will benefit from acute OT to increase activity tolerance prior to d/c. Recommending HHOT upon d/c.    Follow Up Recommendations  Home health OT;Supervision/Assistance - 24 hour    Equipment Recommendations  3 in 1 bedside comode    Recommendations for Other Services       Precautions / Restrictions Precautions Precautions: Fall Precaution Comments: no h/o falls she reports, but unstable on her feet Restrictions Weight Bearing Restrictions: No      Mobility Bed Mobility               General bed mobility comments: not assessed  Transfers Overall transfer level: Needs assistance Equipment used: None Transfers: Sit to/from Stand Sit to Stand: Supervision              Balance  Min assist for LOB in hallway, otherwise Min guard assist for ambulation.                                          ADL Overall ADL's : Needs assistance/impaired     Grooming: Wash/dry hands;Supervision/safety;Set up;Standing (fixed hair)               Lower Body Dressing: Sit to/from stand;Set up;Supervision/safety   Toilet Transfer: Supervision;Ambulation;BSC (very short distance from chair)   [place] and Hygiene: Supervision/safety;Sit to/from stand;Set up   Tub/ Shower Transfer: Tub transfer;Moderate assistance;Ambulation   Functional mobility during ADLs: Supervision (for short distance from chair to BSC)Min guard/Minimal assistance for longer distance (one LOB in hallway); Mod assist for simulated tub transfer. General ADL Comments: Educated on deep breathing technique and energy conservation  techniques. Educated on safety such as using shower chair at home and not getting in tub alone. Mod assist for simulated tub transfer.      Vision     Perception     Praxis      Pertinent Vitals/Pain Pain Assessment: No/denies pain   O2 88%-90's in session on RA (unsure of accuracy of pulse oximeter when reading in 80's). O2 in 90's at end of session.      Hand Dominance     Extremity/Trunk Assessment Upper Extremity Assessment Upper Extremity Assessment: Overall WFL for tasks assessed   Lower Extremity Assessment Lower Extremity Assessment: Defer to PT evaluation       Communication Communication Communication: HOH; (hearing aide)   Cognition Arousal/Alertness: Awake/alert Behavior During Therapy: WFL for tasks assessed/performed Overall Cognitive Status: No family/caregiver present to determine baseline cognitive functioning (h/o cognitive impairments at baseline)                     General Comments       Exercises       Shoulder Instructions      Home Living Family/patient expects to be discharged to:: Private residence Living Arrangements: Non-relatives/Friends Available Help at Discharge: Friend(s);Available PRN/intermittently Type of Home: Mobile home Home Access: Stairs to enter;Ramped entrance Entrance Stairs-Number of Steps: ramp in back   Home Layout: One level     Bathroom Shower/Tub: Teacher, early years/pre:  Standard     Home Equipment: Walker - 2 wheels;Shower seat   Additional Comments: per pt, she has shower chair and regular height toilet (unsure of accuracy)      Prior Functioning/Environment Level of Independence: Independent        Comments: did not drive PTA    OT Diagnosis: Generalized weakness;Other (comment) (cardiopulmonary status limiting activity)   OT Problem List: Decreased strength;Impaired balance (sitting and/or standing);Decreased activity tolerance;Cardiopulmonary status limiting  activity;Decreased knowledge of use of DME or AE;Decreased knowledge of precautions;Decreased cognition   OT Treatment/Interventions: Self-care/ADL training;Energy conservation;DME and/or AE instruction;Therapeutic activities;Therapeutic exercise;Cognitive remediation/compensation;Patient/family education;Balance training    OT Goals(Current goals can be found in the care plan section) Acute Rehab OT Goals Patient Stated Goal: not stated OT Goal Formulation: With patient Time For Goal Achievement: 07/10/14 Potential to Achieve Goals: Good ADL Goals Pt Will Perform Upper Body Bathing: sitting;with set-up Pt Will Perform Lower Body Bathing: with set-up;sit to/from stand Pt Will Perform Lower Body Dressing: with set-up;sit to/from stand Pt Will Transfer to Toilet: with modified independence;ambulating Additional ADL Goal #1: Pt will independently utilize 2/3 energy conservation techniques in session as needed.  OT Frequency: Min 2X/week   Barriers to D/C:            Co-evaluation              End of Session Equipment Utilized During Treatment: Gait belt Nurse Communication: Mobility status;Other (comment) (d/c recommendation)  Activity Tolerance: Patient limited by fatigue Patient left: in chair;with call bell/phone within reach;with chair alarm set   Time: 3295-1884 OT Time Calculation (min): 16 min Charges:  OT General Charges $OT Visit: 1 Procedure OT Evaluation $Initial OT Evaluation Tier I: 1 Procedure G-CodesBenito Mccreedy OTR/L [phone] 07/03/2014, 4:08 PM

## 2014-07-03 NOTE — Progress Notes (Signed)
PROGRESS NOTE  DAVIDA FALCONI OIZ:[id number] DOB: 07/27/1931 DOA: 06/28/2014 PCP: Lottie Dawson, MD  HPI/Recap of past 24 hours: Breathing much better, cough improved  Assessment/Plan: Acute hypoxic resp failure -due to CAP and Diastolic CHF from fluid resuscitation -much improved -got multiple lasix doses 4/19, now off -ECHO with normal EF and wall motion -see Abx below -PCCM signed off  Community acquired pneumonia -Blood Cx with hemophilus influenzae 1/2 -clinically improved, will change to PO Levaquin today -i calledd and /w Dr.COmer  -repeat blood cx negative so far  Acute diastolic CHF -due to volume resuscitation -improving, ECHO with normal EF -continue Po lasix  Sepsis:  -with fever/leukocytosis/lactic acidosis on admission -improved  ARF on CKF III -cr 1.9 on admission, Renal dosing meds. -now 1.88 monitor  Hyperglycemia: -no prior diagnosis of DM2, hbaic 6.0  H/o Dementia: mild, has been on aricept for about a year, AAOX3, but impaired memory  DVT proph: lovenox  Code Status: full Family Communication: POA at bedside Disposition Plan: home tomorrow   Consultants:  none  Procedures:  none  Antibiotics:  Rocephin/zithro from 4/18  vanc from 4/19   Objective: BP 114/44 mmHg  Pulse 66  Temp(Src) 98.5 F (36.9 C) (Oral)  Resp 20  Ht 5\' 2"  (1.575 m)  Wt 74.571 kg (164 lb 6.4 oz)  SpO2 92%  Intake/Output Summary (Last 24 hours) at 07/03/14 1325 Last data filed at 07/03/14 0245  Gross per 24 hour  Intake    222 ml  Output    600 ml  Net   -378 ml   Filed Weights   06/28/14 2230 06/29/14 0106  Weight: 70.761 kg (156 lb) 74.571 kg (164 lb 6.4 oz)    Exam:   General:  AAOx3, no distress, very pleasant this am  Cardiovascular: RRR  Respiratory: CTAB  Abdomen: Soft/ND/NT, positive BS  Musculoskeletal: No Edema  Neuro: AAOx3, impaired memory  Data Reviewed: Basic Metabolic Panel:  Recent Labs Lab 06/29/14 0605  06/30/14 0411 07/01/14 0050 07/02/14 0735 07/03/14 0530  NA 137 138 141 141 141  K 3.1* 3.5 3.2* 4.2 4.6  CL 104 99 100 102 102  CO2 22 26 26 25 28   GLUCOSE 112* 218* 176* 100* 116*  BUN 21 26* 47* 39* 34*  CREATININE 1.55* 1.88* 1.78* 1.41* 1.39*  CALCIUM 7.2* 8.1* 8.4 8.8 8.8  MG  --  1.7  --   --   --   PHOS  --  2.9  --   --   --    Liver Function Tests:  Recent Labs Lab 06/28/14 1931 06/30/14 0411  AST 50* 30  ALT 33 25  ALKPHOS 102 91  BILITOT 1.3* 0.7  PROT 7.3 6.6  ALBUMIN 3.3* 2.3*   No results for input(s): LIPASE, AMYLASE in the last 168 hours. No results for input(s): AMMONIA in the last 168 hours. CBC:  Recent Labs Lab 06/28/14 1931 06/29/14 0605 06/30/14 0411 07/01/14 0430 07/02/14 0735 07/03/14 0530  WBC 13.2* 10.5 12.9* 19.3* 11.2* 13.0*  NEUTROABS 11.7* 9.0*  --   --   --   --   HGB 14.0 11.8* 12.1 13.1 13.3 12.5  HCT 41.9 35.0* 36.4 37.8 40.5 38.6  MCV 87.8 87.1 86.7 85.7 87.9 87.7  PLT 192 152 165 240 227 245   Cardiac Enzymes:    Recent Labs Lab 06/29/14 1908 06/29/14 2232 06/30/14 0411  TROPONINI 0.17* 0.04* 0.06*   BNP (last 3 results)  Recent Labs  06/29/14  1908  BNP 353.9*    ProBNP (last 3 results) No results for input(s): PROBNP in the last 8760 hours.  CBG:  Recent Labs Lab 06/29/14 1602  GLUCAP 150*    Recent Results (from the past 240 hour(s))  Urine culture     Status: None   Collection Time: 06/28/14  7:28 PM  Result Value Ref Range Status   Specimen Description URINE, CLEAN CATCH  Final   Special Requests NONE  Final   Colony Count NO GROWTH Performed at [facility]   Final   Culture NO GROWTH Performed at [facility]   Final   Report Status 06/30/2014 FINAL  Final  Blood Culture (routine x 2)     Status: None (Preliminary result)   Collection Time: 06/28/14 10:40 PM  Result Value Ref Range Status   Specimen Description BLOOD LEFT WRIST  Final   Special Requests BOTTLES DRAWN  AEROBIC AND ANAEROBIC 6CC  Final   Culture   Final    HAEMOPHILUS INFLUENZAE Note: CRITICAL RESULT CALLED TO, READ BACK BY AND VERIFIED WITH: TIFFANY LEWIS BY INGRAM A 07/02/14 130PM BETA LACTAMASE NEGATIVE Note: Gram Stain Report Called to,Read Back By and Verified With: ALBA ASATSAWO @ 9629 ON [id number] BY [place] Performed at [facility]    Report Status PENDING  Incomplete  Blood Culture (routine x 2)     Status: None (Preliminary result)   Collection Time: 06/28/14 10:42 PM  Result Value Ref Range Status   Specimen Description BLOOD RIGHT HAND  Final   Special Requests BOTTLES DRAWN AEROBIC ONLY 4CC  Final   Culture   Final           BLOOD CULTURE RECEIVED NO GROWTH TO DATE CULTURE WILL BE HELD FOR 5 DAYS BEFORE ISSUING A FINAL NEGATIVE REPORT Performed at [facility]    Report Status PENDING  Incomplete  Influenza virus ag, a+b (DFA)     Status: None   Collection Time: 06/29/14  4:52 AM  Result Value Ref Range Status   Influenza Virus A and B Ag REPORT  Final    Comment: (NOTE) Influenza Virus Type A#B Antigen, DFA SOURCE : NOT SUPPLIED Influenza Type A Ag, DFA        Negative for influenza virus Influenza Type B Ag, DFA        Negative for influenza virus For maximum sensitivity in the diagnosis of viral infections, a negative or suspected positive result should be confirmed by viral culture. Performed at [facility], Urine     Status: None   Collection Time: 06/29/14  5:50 PM  Result Value Ref Range Status   Specimen Description URINE, CATHETERIZED  Final   Special Requests NONE  Final   Colony Count NO GROWTH Performed at [facility]   Final   Culture NO GROWTH Performed at [facility]   Final   Report Status 07/01/2014 FINAL  Final  MRSA PCR Screening     Status: None   Collection Time: 06/29/14  6:30 PM  Result Value Ref Range Status   MRSA by PCR NEGATIVE NEGATIVE Final    Comment:        The GeneXpert MRSA  Assay (FDA approved for NASAL specimens only), is one component of a comprehensive MRSA colonization surveillance program. It is not intended to diagnose MRSA infection nor to guide or monitor treatment for MRSA infections.   Culture, blood (routine x 2)  Status: None (Preliminary result)   Collection Time: 07/01/14  2:37 PM  Result Value Ref Range Status   Specimen Description BLOOD RIGHT ARM  Final   Special Requests BOTTLES DRAWN AEROBIC AND ANAEROBIC 10CC  Final   Culture   Final           BLOOD CULTURE RECEIVED NO GROWTH TO DATE CULTURE WILL BE HELD FOR 5 DAYS BEFORE ISSUING A FINAL NEGATIVE REPORT Performed at [facility]    Report Status PENDING  Incomplete  Culture, blood (routine x 2)     Status: None (Preliminary result)   Collection Time: 07/01/14  2:48 PM  Result Value Ref Range Status   Specimen Description BLOOD LEFT ARM  Final   Special Requests BOTTLES DRAWN AEROBIC AND ANAEROBIC 10CC  Final   Culture   Final           BLOOD CULTURE RECEIVED NO GROWTH TO DATE CULTURE WILL BE HELD FOR 5 DAYS BEFORE ISSUING A FINAL NEGATIVE REPORT Performed at [facility]    Report Status PENDING  Incomplete     Studies: No results found.  Scheduled Meds: . antiseptic oral rinse  7 mL Mouth Rinse BID  . donepezil  10 mg Oral QHS  . enoxaparin (LOVENOX) injection  30 mg Subcutaneous Q24H  . furosemide  20 mg Oral Daily  . guaiFENesin  600 mg Oral BID  . ipratropium-albuterol  3 mL Nebulization TID  . levofloxacin  500 mg Oral Daily  . polyethylene glycol  17 g Oral Daily  . potassium chloride  20 mEq Oral Daily  . senna-docusate  2 tablet Oral BID    Continuous Infusions:     Time spent: >66mins  Paulita Licklider MD [facility] Pager [phone]. If 7PM-7AM, please contact night-coverage at [email or web address], password [email or web address] 07/03/2014, 1:25 PM  LOS: 5 days

## 2014-07-04 LAB — CBC
HEMATOCRIT: 36.2 % (ref 36.0–46.0)
Hemoglobin: 11.8 g/dL — ABNORMAL LOW (ref 12.0–15.0)
MCH: 28.6 pg (ref 26.0–34.0)
MCHC: 32.6 g/dL (ref 30.0–36.0)
MCV: 87.9 fL (ref 78.0–100.0)
Platelets: 246 10*3/uL (ref 150–400)
RBC: 4.12 MIL/uL (ref 3.87–5.11)
RDW: 14.4 % (ref 11.5–15.5)
WBC: 13.1 10*3/uL — ABNORMAL HIGH (ref 4.0–10.5)

## 2014-07-04 LAB — BASIC METABOLIC PANEL
ANION GAP: 11 (ref 5–15)
BUN: 32 mg/dL — ABNORMAL HIGH (ref 6–23)
CO2: 25 mmol/L (ref 19–32)
Calcium: 8.4 mg/dL (ref 8.4–10.5)
Chloride: 99 mmol/L (ref 96–112)
Creatinine, Ser: 1.55 mg/dL — ABNORMAL HIGH (ref 0.50–1.10)
GFR calc Af Amer: 35 mL/min — ABNORMAL LOW (ref >90)
GFR calc non Af Amer: 30 mL/min — ABNORMAL LOW (ref >90)
Glucose, Bld: 121 mg/dL — ABNORMAL HIGH (ref 70–99)
POTASSIUM: 4.4 mmol/L (ref 3.5–5.1)
SODIUM: 135 mmol/L (ref 135–145)

## 2014-07-04 MED ORDER — LEVOFLOXACIN 500 MG PO TABS: 500.0000 mg | ORAL_TABLET | ORAL | Status: DC

## 2014-07-04 NOTE — Progress Notes (Signed)
Nsg Discharge Note  Admit Date:  06/28/2014 Discharge date: 07/04/2014   Kelly Craze Menzel to be D/C'd Home per MD order.  AVS completed.  Copy for chart, and copy for patient signed, and dated. Patient/caregiver able to verbalize understanding.  Discharge Medication:   Medication List    TAKE these medications        aspirin 81 MG EC tablet  Take 81 mg by mouth daily.     CENTRUM SILVER PO  Take 1 tablet by mouth daily.     donepezil 10 MG tablet  Commonly known as:  ARICEPT  TAKE 1 TABLET BY MOUTH AT BEDTIME AS NEEDED     hydrochlorothiazide 12.5 MG capsule  Commonly known as:  MICROZIDE  TAKE 1 CAPSULE (12.5 MG TOTAL) BY MOUTH DAILY.     levofloxacin 500 MG tablet  Commonly known as:  LEVAQUIN  Take 1 tablet (500 mg total) by mouth every other day. For 4days     simvastatin 40 MG tablet  Commonly known as:  ZOCOR  TAKE 1 TABLET BY MOUTH AT BEDTIME     traMADol 50 MG tablet  Commonly known as:  ULTRAM  Take 1 tablet (50 mg total) by mouth at bedtime as needed.        Discharge Assessment: Filed Vitals:   07/04/14 0557  BP: 117/60  Pulse:   Temp: 100.2 F (37.9 C)  Resp: 34   Skin clean, dry and intact without evidence of skin break down, no evidence of skin tears noted. IV catheter discontinued intact. Site without signs and symptoms of complications - no redness or edema noted at insertion site, patient denies c/o pain - only slight tenderness at site.  Dressing with slight pressure applied.  D/c Instructions-Education: Discharge instructions given to patient/family with verbalized understanding. D/c education completed with patient/family including follow up instructions, medication list, d/c activities limitations if indicated, with other d/c instructions as indicated by MD - patient able to verbalize understanding, all questions fully answered. Patient instructed to return to ED, call 911, or call MD for any changes in condition.  Patient escorted via [place], and  D/C home via private auto.  Jillian Pianka, Thornell Mule, RN 07/04/2014 3:08 PM  Nsg Discharge Note  Admit Date:  06/28/2014 Discharge date: 07/04/2014   Kelly Conrad to be D/C'd Home per MD order.  AVS completed.  Copy for chart, and copy for patient signed, and dated. Patient/caregiver able to verbalize understanding.  Discharge Medication:   Medication List    TAKE these medications        aspirin 81 MG EC tablet  Take 81 mg by mouth daily.     CENTRUM SILVER PO  Take 1 tablet by mouth daily.     donepezil 10 MG tablet  Commonly known as:  ARICEPT  TAKE 1 TABLET BY MOUTH AT BEDTIME AS NEEDED     hydrochlorothiazide 12.5 MG capsule  Commonly known as:  MICROZIDE  TAKE 1 CAPSULE (12.5 MG TOTAL) BY MOUTH DAILY.     levofloxacin 500 MG tablet  Commonly known as:  LEVAQUIN  Take 1 tablet (500 mg total) by mouth every other day. For 4days     simvastatin 40 MG tablet  Commonly known as:  ZOCOR  TAKE 1 TABLET BY MOUTH AT BEDTIME     traMADol 50 MG tablet  Commonly known as:  ULTRAM  Take 1 tablet (50 mg total) by mouth at bedtime as needed.  Discharge Assessment: Filed Vitals:   07/04/14 0557  BP: 117/60  Pulse:   Temp: 100.2 F (37.9 C)  Resp: 34   Skin clean, dry and intact without evidence of skin break down, no evidence of skin tears noted. IV catheter discontinued intact. Site without signs and symptoms of complications - no redness or edema noted at insertion site, patient denies c/o pain - only slight tenderness at site.  Dressing with slight pressure applied.  D/c Instructions-Education: Discharge instructions given to patient/family with verbalized understanding. D/c education completed with patient/family including follow up instructions, medication list, d/c activities limitations if indicated, with other d/c instructions as indicated by MD - patient able to verbalize understanding, all questions fully answered. Patient instructed to return to ED,  call 911, or call MD for any changes in condition.  Patient escorted via [place], and D/C home via private auto.  Edrian Melucci Margaretha Sheffield, RN 07/04/2014 3:09 PM

## 2014-07-05 LAB — CULTURE, BLOOD (ROUTINE X 2): Culture: NO GROWTH

## 2014-07-06 ENCOUNTER — Telehealth: Payer: Self-pay

## 2014-07-06 DIAGNOSIS — F0390 Unspecified dementia without behavioral disturbance: Secondary | ICD-10-CM | POA: Diagnosis not present

## 2014-07-06 DIAGNOSIS — I10 Essential (primary) hypertension: Secondary | ICD-10-CM | POA: Diagnosis not present

## 2014-07-06 DIAGNOSIS — J45909 Unspecified asthma, uncomplicated: Secondary | ICD-10-CM | POA: Diagnosis not present

## 2014-07-06 DIAGNOSIS — E785 Hyperlipidemia, unspecified: Secondary | ICD-10-CM | POA: Diagnosis not present

## 2014-07-06 DIAGNOSIS — K219 Gastro-esophageal reflux disease without esophagitis: Secondary | ICD-10-CM | POA: Diagnosis not present

## 2014-07-06 NOTE — Telephone Encounter (Signed)
Linna Caprice called requesting verbal orders for skilled nursing for pt after recent hospitalization for pneumonia.  He is requesting Fall risk and medicaiton review and management.  Please advise and call Jim back.

## 2014-07-06 NOTE — Discharge Summary (Signed)
Physician Discharge Summary  Kelly Conrad HYI:[id number] DOB: [birth year] DOA: 06/28/2014  PCP: Lottie Dawson, MD  Admit date: 06/28/2014 Discharge date: 07/04/2014  Time spent: 45 minutes  Recommendations for Outpatient Follow-up:  1. PCP in 1 week, please assess need for long term diuretics on follow up 2. CXR in 4-6weeks  Discharge Diagnoses:  Principal Problem:   CAP (community acquired pneumonia) Active Problems:   Essential hypertension   Osteoporosis   Dementia   Benign hypertensive renal disease   Sepsis   Acute respiratory failure   Shortness of breath   Discharge Condition: stable  Diet recommendation: low sodium  Filed Weights   06/28/14 2230 06/29/14 0106  Weight: 70.761 kg (156 lb) 74.571 kg (164 lb 6.4 oz)    History of present illness:  Chief Complaint: fever 81 yo female h/o dementia presented to ER with one day of high fever and cough  Hospital Course:  Acute hypoxic resp failure -due to CAP and Diastolic CHF from fluid resuscitation -much improved -got multiple lasix doses 4/19, now off -ECHO with normal EF and wall motion -see Abx below  Community acquired pneumonia -Blood Cx with hemophilus influenzae 1/2 -clinically improved, was initially on broad spectrum antibiotics then change to PO Levaquin -i called and d/w infectious disease, Dr.COmer, recommended completing 7day course of levaquin for this  -repeat blood cx negative so far  Acute diastolic CHF -due to volume resuscitation -improved, ECHO with normal EF -only required 2 doses of Iv lasix on 2nd day of hospitalization, off diuretics since. -need for long term diuretics to be assessed upon follow up  Sepsis:  -with fever/leukocytosis/lactic acidosis on admission -improved  ARF on CKF III -cr 1.9 on admission, improved to 1.5 at discharge  Hyperglycemia: -no prior diagnosis of DM2, hbaic 6.0  H/o Dementia: mild, has been on aricept for about a year, AAOX3, but impaired  memory  Discharge Exam: Filed Vitals:   07/04/14 0557  BP: 117/60  Pulse:   Temp: 100.2 F (37.9 C)  Resp: 34    General:AAox to self, place Cardiovascular: S1S2/RRR Respiratory: CTAB  Discharge Instructions   Discharge Instructions    Diet - low sodium heart healthy    Complete by:  As directed      Increase activity slowly    Complete by:  As directed           Discharge Medication List as of 07/04/2014  2:01 PM    START taking these medications   Details  levofloxacin (LEVAQUIN) 500 MG tablet Take 1 tablet (500 mg total) by mouth every other day. For 4days, Starting 07/04/2014, Until Discontinued, Print      CONTINUE these medications which have NOT CHANGED   Details  aspirin 81 MG EC tablet Take 81 mg by mouth daily.  , Until Discontinued, Historical Med    donepezil (ARICEPT) 10 MG tablet TAKE 1 TABLET BY MOUTH AT BEDTIME AS NEEDED, Normal    hydrochlorothiazide (MICROZIDE) 12.5 MG capsule TAKE 1 CAPSULE (12.5 MG TOTAL) BY MOUTH DAILY., Normal    Multiple Vitamins-Minerals (CENTRUM SILVER PO) Take 1 tablet by mouth daily. , Until Discontinued, Historical Med    simvastatin (ZOCOR) 40 MG tablet TAKE 1 TABLET BY MOUTH AT BEDTIME, Normal    traMADol (ULTRAM) 50 MG tablet Take 1 tablet (50 mg total) by mouth at bedtime as needed., Starting 03/02/2014, Until Discontinued, Print       Allergies  Allergen Reactions  . Tuberculin Purified Protein Derivative  REACTION: POSITIVE RESULT  --CXR   Follow-up Information    Follow up with [facility].   Why:  HH PT   Contact information:   [address] [place] [address] [phone]       Follow up with Lottie Dawson, MD. Schedule an appointment as soon as possible for a visit in 1 week.   Specialties:  Internal Medicine, Pediatrics   Contact information:   [place] [address] [phone]       Follow up with [facility].    Why:  home health physical and ocupational therapy   Contact information:   [address] [place] [address] [phone]        The results of significant diagnostics from this hospitalization (including imaging, microbiology, ancillary and laboratory) are listed below for reference.    Significant Diagnostic Studies: Dg Chest Port 1 View  06/30/2014   CLINICAL DATA:  Shortness of breath and cough  EXAM: PORTABLE CHEST - 1 VIEW  COMPARISON:  06/29/2014  FINDINGS: Persistent elevation of the right diaphragm with bandlike atelectasis. The could be subpulmonic effusion and obscured consolidation.  The left lung is well aerated.  Unchanged cardiomegaly.  Aortic and hilar contours are negative.  IMPRESSION: Right basilar atelectasis which is increased from admission but stable from yesterday. There could be underlying pneumonia or subpulmonic effusion.   Electronically Signed   By: Monte Fantasia M.D.   On: 06/30/2014 06:34   Dg Chest Port 1 View  06/29/2014   CLINICAL DATA:  Short of breath 06/28/2014  EXAM: PORTABLE CHEST - 1 VIEW  COMPARISON:  None.  FINDINGS: Cardiac enlargement with vascular congestion. Progression of right pleural effusion which is now moderately large. Compressive atelectasis right lower lobe also has progressed. Mild left lower lobe atelectasis unchanged.  IMPRESSION: Cardiac enlargement with vascular congestion.  Progression of right effusion and right lower lobe airspace disease. Findings suggest either heart failure or pneumonia.   Electronically Signed   By: Franchot Gallo M.D.   On: 06/29/2014 16:40   Dg Chest Port 1 View  06/28/2014   CLINICAL DATA:  Right-sided flank pain beginning today. Hypertension and asthma.  EXAM: PORTABLE CHEST - 1 VIEW  COMPARISON:  10/04/2010  FINDINGS: The heart is enlarged. There is abnormal density in both lower lobes consistent with atelectasis/ pneumonia. The upper lungs are clear except for chronic pleural and parenchymal  scarring. No acute bony finding.  IMPRESSION: Abnormal density in both lower lobes consistent with atelectasis/pneumonia.   Electronically Signed   By: Nelson Chimes M.D.   On: 06/28/2014 23:27    Microbiology: Recent Results (from the past 240 hour(s))  Urine culture     Status: None   Collection Time: 06/28/14  7:28 PM  Result Value Ref Range Status   Specimen Description URINE, CLEAN CATCH  Final   Special Requests NONE  Final   Colony Count NO GROWTH Performed at [facility]   Final   Culture NO GROWTH Performed at [facility]   Final   Report Status 06/30/2014 FINAL  Final  Blood Culture (routine x 2)     Status: None (Preliminary result)   Collection Time: 06/28/14 10:40 PM  Result Value Ref Range Status   Specimen Description BLOOD LEFT WRIST  Final   Special Requests BOTTLES DRAWN AEROBIC AND ANAEROBIC 6CC  Final   Culture   Final    HAEMOPHILUS INFLUENZAE Note: CRITICAL RESULT  CALLED TO, READ BACK BY AND VERIFIED WITH: TIFFANY LEWIS BY INGRAM A 07/02/14 130PM BETA LACTAMASE NEGATIVE Note: Gram Stain Report Called to,Read Back By and Verified With: ALBA ASATSAWO @ 4401 ON [id number] BY [place] Performed at [facility]    Report Status PENDING  Incomplete  Blood Culture (routine x 2)     Status: None   Collection Time: 06/28/14 10:42 PM  Result Value Ref Range Status   Specimen Description BLOOD RIGHT HAND  Final   Special Requests BOTTLES DRAWN AEROBIC ONLY 4CC  Final   Culture   Final    NO GROWTH 5 DAYS Performed at [facility]    Report Status 07/05/2014 FINAL  Final  Influenza virus ag, a+b (DFA)     Status: None   Collection Time: 06/29/14  4:52 AM  Result Value Ref Range Status   Influenza Virus A and B Ag REPORT  Final    Comment: (NOTE) Influenza Virus Type A#B Antigen, DFA SOURCE : NOT SUPPLIED Influenza Type A Ag, DFA        Negative for influenza virus Influenza Type B Ag, DFA        Negative for influenza virus For  maximum sensitivity in the diagnosis of viral infections, a negative or suspected positive result should be confirmed by viral culture. Performed at [facility], Urine     Status: None   Collection Time: 06/29/14  5:50 PM  Result Value Ref Range Status   Specimen Description URINE, CATHETERIZED  Final   Special Requests NONE  Final   Colony Count NO GROWTH Performed at [facility]   Final   Culture NO GROWTH Performed at [facility]   Final   Report Status 07/01/2014 FINAL  Final  MRSA PCR Screening     Status: None   Collection Time: 06/29/14  6:30 PM  Result Value Ref Range Status   MRSA by PCR NEGATIVE NEGATIVE Final    Comment:        The GeneXpert MRSA Assay (FDA approved for NASAL specimens only), is one component of a comprehensive MRSA colonization surveillance program. It is not intended to diagnose MRSA infection nor to guide or monitor treatment for MRSA infections.   Culture, blood (routine x 2)     Status: None (Preliminary result)   Collection Time: 07/01/14  2:37 PM  Result Value Ref Range Status   Specimen Description BLOOD RIGHT ARM  Final   Special Requests BOTTLES DRAWN AEROBIC AND ANAEROBIC 10CC  Final   Culture   Final           BLOOD CULTURE RECEIVED NO GROWTH TO DATE CULTURE WILL BE HELD FOR 5 DAYS BEFORE ISSUING A FINAL NEGATIVE REPORT Performed at [facility]    Report Status PENDING  Incomplete  Culture, blood (routine x 2)     Status: None (Preliminary result)   Collection Time: 07/01/14  2:48 PM  Result Value Ref Range Status   Specimen Description BLOOD LEFT ARM  Final   Special Requests BOTTLES DRAWN AEROBIC AND ANAEROBIC 10CC  Final   Culture   Final           BLOOD CULTURE RECEIVED NO GROWTH TO DATE CULTURE WILL BE HELD FOR 5 DAYS BEFORE ISSUING A FINAL NEGATIVE REPORT Performed at [facility]    Report Status PENDING  Incomplete     Labs: Basic Metabolic Panel:  Recent  Labs Lab 06/30/14 0411 07/01/14  0050 07/02/14 0735 07/03/14 0530 07/04/14 0525  NA 138 141 141 141 135  K 3.5 3.2* 4.2 4.6 4.4  CL 99 100 102 102 99  CO2 26 26 25 28 25   GLUCOSE 218* 176* 100* 116* 121*  BUN 26* 47* 39* 34* 32*  CREATININE 1.88* 1.78* 1.41* 1.39* 1.55*  CALCIUM 8.1* 8.4 8.8 8.8 8.4  MG 1.7  --   --   --   --   PHOS 2.9  --   --   --   --    Liver Function Tests:  Recent Labs Lab 06/30/14 0411  AST 30  ALT 25  ALKPHOS 91  BILITOT 0.7  PROT 6.6  ALBUMIN 2.3*   No results for input(s): LIPASE, AMYLASE in the last 168 hours. No results for input(s): AMMONIA in the last 168 hours. CBC:  Recent Labs Lab 06/30/14 0411 07/01/14 0430 07/02/14 0735 07/03/14 0530 07/04/14 0525  WBC 12.9* 19.3* 11.2* 13.0* 13.1*  HGB 12.1 13.1 13.3 12.5 11.8*  HCT 36.4 37.8 40.5 38.6 36.2  MCV 86.7 85.7 87.9 87.7 87.9  PLT 165 240 227 245 246   Cardiac Enzymes:  Recent Labs Lab 06/29/14 1908 06/29/14 2232 06/30/14 0411  TROPONINI 0.17* 0.04* 0.06*   BNP: BNP (last 3 results)  Recent Labs  06/29/14 1908  BNP 353.9*    ProBNP (last 3 results) No results for input(s): PROBNP in the last 8760 hours.  CBG: No results for input(s): GLUCAP in the last 168 hours.     SignedDomenic Polite  [facility] 07/06/2014, 5:41 PM

## 2014-07-07 NOTE — Telephone Encounter (Signed)
Have not seen patient yet  Post hospital so cannot  Do orders for med evaluation and management .  Please have hospital team direct any discharge/ orders needed  In transition  until patient is seen by primary team.  07/16/2014 Thanks

## 2014-07-07 NOTE — Telephone Encounter (Signed)
No telephone number in note.  Will wait on another call.

## 2014-07-08 DIAGNOSIS — E785 Hyperlipidemia, unspecified: Secondary | ICD-10-CM | POA: Diagnosis not present

## 2014-07-08 DIAGNOSIS — F0390 Unspecified dementia without behavioral disturbance: Secondary | ICD-10-CM | POA: Diagnosis not present

## 2014-07-08 DIAGNOSIS — J45909 Unspecified asthma, uncomplicated: Secondary | ICD-10-CM | POA: Diagnosis not present

## 2014-07-08 DIAGNOSIS — I10 Essential (primary) hypertension: Secondary | ICD-10-CM | POA: Diagnosis not present

## 2014-07-08 DIAGNOSIS — K219 Gastro-esophageal reflux disease without esophagitis: Secondary | ICD-10-CM | POA: Diagnosis not present

## 2014-07-08 LAB — CULTURE, BLOOD (ROUTINE X 2)
Culture: NO GROWTH
Culture: NO GROWTH

## 2014-07-08 NOTE — Telephone Encounter (Signed)
Kelly Conrad [phone] from [facility] called about verbal orders regarding a nurse evaluation. Also during today session patient had a episode of diarrhea incontinence. Caregiver reported give the patient a laxative after several days of regular BMs. Caregiver reported appt on 07/16/2014 was the first available appointment since being discharged from the hospital. Caregiver was advised to discuss medication with patient primary care physician.

## 2014-07-08 NOTE — Telephone Encounter (Signed)
Left confidential message on Kelly Conrad's machine informing him that [facility] is unable to sign off on any orders until she is seen in the office for her post hospital follow up on 07/16/2014.  Instructed him to call back if he had any questions.  Left office telephone number.

## 2014-07-08 NOTE — Telephone Encounter (Signed)
Tried [employer].  Not a good connection.  Will try again at a later time.

## 2014-07-09 ENCOUNTER — Telehealth: Payer: Self-pay | Admitting: Internal Medicine

## 2014-07-09 NOTE — Telephone Encounter (Signed)
Misty will check to sch if slot comes avail

## 2014-07-09 NOTE — Telephone Encounter (Signed)
Pt has been sch for 07-14-14

## 2014-07-09 NOTE — Telephone Encounter (Signed)
Needs new slot.

## 2014-07-09 NOTE — Telephone Encounter (Signed)
[phone] minutes  on Wednesday? May  4th

## 2014-07-09 NOTE — Telephone Encounter (Signed)
Pt's caregiver and son, [name] calling to see if it is any way possible to have patient scheduled sooner than 07/16/14 for hospital follow up.  [name] states mother seems to be stable and has no wheezing that he can hear but the hospital advised pt to be seen within seven days of discharge which was 07/04/14.  [name] states he is ok to wait until the 6th if they have to but wanted to see if PCP would have any availability anytime before then.

## 2014-07-13 ENCOUNTER — Telehealth: Payer: Self-pay | Admitting: Internal Medicine

## 2014-07-13 DIAGNOSIS — F0390 Unspecified dementia without behavioral disturbance: Secondary | ICD-10-CM | POA: Diagnosis not present

## 2014-07-13 DIAGNOSIS — J45909 Unspecified asthma, uncomplicated: Secondary | ICD-10-CM | POA: Diagnosis not present

## 2014-07-13 DIAGNOSIS — E785 Hyperlipidemia, unspecified: Secondary | ICD-10-CM | POA: Diagnosis not present

## 2014-07-13 DIAGNOSIS — K219 Gastro-esophageal reflux disease without esophagitis: Secondary | ICD-10-CM | POA: Diagnosis not present

## 2014-07-13 DIAGNOSIS — I10 Essential (primary) hypertension: Secondary | ICD-10-CM | POA: Diagnosis not present

## 2014-07-13 NOTE — Telephone Encounter (Signed)
Will plan on addressing  at [place].

## 2014-07-13 NOTE — Telephone Encounter (Signed)
Patient has appointment on 07/14/14 and Clair Gulling would like the following information given to patient and caregiver:  request clarification on blisters on right gluteal region, patient demonstrates change in BP with standing, care giver needs guidance in accessing medical assistance with acute issues to avoid going to hospital, continuing to request skilled nursing evaluation.  Clair Gulling states he does not need a callback unless you need more information.

## 2014-07-14 ENCOUNTER — Encounter: Payer: Self-pay | Admitting: Internal Medicine

## 2014-07-14 ENCOUNTER — Ambulatory Visit (INDEPENDENT_AMBULATORY_CARE_PROVIDER_SITE_OTHER): Payer: Medicare Other | Admitting: Internal Medicine

## 2014-07-14 VITALS — BP 124/60 | HR 84 | Temp 98.3°F | Ht 62.0 in | Wt 155.9 lb

## 2014-07-14 DIAGNOSIS — N289 Disorder of kidney and ureter, unspecified: Secondary | ICD-10-CM | POA: Diagnosis not present

## 2014-07-14 DIAGNOSIS — F0390 Unspecified dementia without behavioral disturbance: Secondary | ICD-10-CM | POA: Diagnosis not present

## 2014-07-14 DIAGNOSIS — I951 Orthostatic hypotension: Secondary | ICD-10-CM

## 2014-07-14 DIAGNOSIS — J14 Pneumonia due to Hemophilus influenzae: Secondary | ICD-10-CM | POA: Diagnosis not present

## 2014-07-14 DIAGNOSIS — L989 Disorder of the skin and subcutaneous tissue, unspecified: Secondary | ICD-10-CM | POA: Diagnosis not present

## 2014-07-14 MED ORDER — MUPIROCIN CALCIUM 2 % EX CREA: 1.0000 "application " | TOPICAL_CREAM | Freq: Three times a day (TID) | CUTANEOUS | Status: DC

## 2014-07-14 NOTE — Progress Notes (Signed)
Pre visit review using our clinic review tool, if applicable. No additional management support is needed unless otherwise documented below in the visit note.  Chief Complaint  Patient presents with  . Hospitalization Follow-up    HPI:  Patient comes in today as follow up from hospitalization for resp  Failure diastolic hf from fluid resuscitation actue renal decompensations  ahd pos dx h flu.  pna see below  unfortunantely there is no complete written dc summary  See below  Since d/c  Mr hester has taken care of her at home as well as home health. She is doing much better walking doing exercise s  No sig cough at this time and no sob  Of fluid issues. Eating well.  There is a sore area right buttocks there at hosp dc  doctoring with no stick dressing zinc oxide and trippel antibiotic ointment  Getting better  One lesslike a whie blister and then    Sore  Not that tender  No fever  No new rash    Discharge Summaries by Domenic Polite, MD at 07/04/2014 5:41 PM    Author: Domenic Polite, MD Service: Internal Medicine Author Type: Physician   Filed: 07/06/2014 5:42 PM Note Time: 07/04/2014 5:41 PM Status: Incomplete   Editor: Domenic Polite, MD (Physician)     Expand All Collapse All   Physician Discharge Summary  Kelly Conrad PYP:[id number] DOB: 1931-12-28 DOA: 06/28/2014  PCP: Lottie Dawson, MD  Admit date: 06/28/2014 Discharge date: 07/04/2014  Time spent: 45 minutes  Recommendations for Outpatient Follow-up:  1. PCP in 1 week 2. CXR in 4-6weeks  Discharge Diagnoses:  Principal Problem:  CAP (community acquired pneumonia) Active Problems:  Essential hypertension  Osteoporosis  Dementia  Benign hypertensive renal disease  Sepsis  Acute respiratory failure  Shortness of breath   Discharge Condition:   Diet recommendation:   Filed Weights   06/28/14 2230 06/29/14 0106  Weight: 70.761 kg (156 lb) 74.571 kg (164 lb 6.4 oz)    History of  present illness:   Hospital Course:   Procedures:   (i.e. Studies not automatically included, echos, thoracentesis, etc; not x-rays)  Consultations:   Discharge Exam: Filed Vitals:   07/04/14 0557  BP: 117/60  Pulse:   Temp: 100.2 F (37.9 C)  Resp: 34    General Cardiovascular:  Respiratory:   Discharge Instructions   Discharge Instructions    Diet - low sodium heart healthy  Complete by: As directed      Increase activity slowly  Complete by: As directed           Discharge Medication List as of 07/04/2014 2:01 PM    START taking these medications   Details  levofloxacin (LEVAQUIN) 500 MG tablet Take 1 tablet (500 mg total) by mouth every other day. For 4days, Starting 07/04/2014, Until Discontinued, Print      CONTINUE these medications which have NOT CHANGED   Details  aspirin 81 MG EC tablet Take 81 mg by mouth daily. , Until Discontinued, Historical Med    donepezil (ARICEPT) 10 MG tablet TAKE 1 TABLET BY MOUTH AT BEDTIME AS NEEDED, Normal    hydrochlorothiazide (MICROZIDE) 12.5 MG capsule TAKE 1 CAPSULE (12.5 MG TOTAL) BY MOUTH DAILY., Normal    Multiple Vitamins-Minerals (CENTRUM SILVER PO) Take 1 tablet by mouth daily. , Until Discontinued, Historical Med    simvastatin (ZOCOR) 40 MG tablet TAKE 1 TABLET BY MOUTH AT BEDTIME, Normal    traMADol (ULTRAM) 50 MG tablet  Take 1 tablet (50 mg total) by mouth at bedtime as needed., Starting 03/02/2014, Until Discontinued, Print       Allergies  Allergen Reactions  . Tuberculin Purified Protein Derivative     REACTION: POSITIVE RESULT --CXR   Follow-up Information    Follow up with [facility].   Why: HH PT   Contact information:   [address] [place] [address] [phone]       Follow up with Lottie Dawson, MD. Schedule an appointment as soon as possible for a visit in  1 week.   Specialties: Internal Medicine, Pediatrics   Contact information:   [place] [address] [phone]       Follow up with [facility].   Why: home health physical and ocupational therapy   Contact information:   [place] [address] [phone]         ROS: See pertinent positives and negatives per HPI. No current cough fainting  abd pain fever.    Past Medical History  Diagnosis Date  . Allergy   . Hypertension   . Hyperlipidemia   . GERD (gastroesophageal reflux disease)   . Asthma   . Hx of varicella   . History of positive PPD     gets c xray screen  . Dementia     Family History  Problem Relation Age of Onset  . Hypertension      fhx  . Diabetes Mother   . Stroke Mother   . Diabetes Father   . Edema Father     legs    History   Social History  . Marital Status: Single    Spouse Name: N/A  . Number of Children: N/A  . Years of Education: N/A   Social History Main Topics  . Smoking status: Never Smoker   . Smokeless tobacco: Never Used  . Alcohol Use: No  . Drug Use: No  . Sexual Activity: No   Other Topics Concern  . 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 [name]    He is the main caretaker. Chayil took care of his mom  Who has since passed away.   He has some vision problems and partial amputation but  No mobiilty problems       Neg ets FA safely stored smoke alarm   No tob or etoh.    Outpatient Prescriptions Prior to Visit  Medication Sig Dispense Refill  . aspirin 81 MG EC tablet Take 81 mg by mouth daily.      Marland Kitchen donepezil (ARICEPT) 10 MG tablet TAKE 1 TABLET BY MOUTH AT BEDTIME AS NEEDED (Patient taking differently: TAKE 1 TABLET BY MOUTH AT BEDTIME.) 90 tablet 0  . hydrochlorothiazide (MICROZIDE) 12.5 MG capsule TAKE 1 CAPSULE (12.5 MG TOTAL) BY MOUTH DAILY.  30 capsule 5  . Multiple Vitamins-Minerals (CENTRUM SILVER PO) Take 1 tablet by mouth daily.     . simvastatin (ZOCOR) 40 MG tablet TAKE 1 TABLET BY MOUTH AT BEDTIME 90 tablet 2  . traMADol (ULTRAM) 50 MG tablet Take 1 tablet (50 mg total) by mouth at bedtime as needed. (Patient taking differently: Take 50 mg by mouth at bedtime as needed for moderate pain. ) 30 tablet 5  . levofloxacin (LEVAQUIN) 500 MG tablet Take 1 tablet (500 mg total) by mouth every other  day. For 4days 3 tablet 0   No facility-administered medications prior to visit.     EXAM:  BP 124/60 mmHg  Pulse 84  Temp(Src) 98.3 F (36.8 C) (Oral)  Ht 5\' 2"  (1.575 m)  Wt 155 lb 14.4 oz (70.716 kg)  BMI 28.51 kg/m2  SpO2 95%  Body mass index is 28.51 kg/(m^2).  GENERAL: vitals reviewed and listed above, alert, oriented, appears well hydrated and in no acute distress smiling gets up and walks pretty steady . No cough dyspnea  HEENT: atraumatic, conjunctiva  clear, no obvious abnormalities on inspection of external nose and ears OP : no lesion edema or exudate  NECK: no obvious masses on inspection palpation  LUNGS: clear to auscultation bilaterally, no wheezes, rales or rhonchi,  ? Bases dec bs  CV: HRRR, no clubbing cyanosis or  Significant peripheral edema nl cap refill  MS: moves all extremities without noticeable focal  abnormality PSYCH: pleasant and cooperative, Skin right buttocks fading papules 4 and one medial boil some induration pinpoint whole  No fluctuance  Lab Results  Component Value Date   WBC 13.1* 07/04/2014   HGB 11.8* 07/04/2014   HCT 36.2 07/04/2014   PLT 246 07/04/2014   GLUCOSE 121* 07/04/2014   CHOL 199 03/02/2014   TRIG 176.0* 03/02/2014   HDL 52.30 03/02/2014   LDLDIRECT 115.6 11/24/2012   LDLCALC 112* 03/02/2014   ALT 25 06/30/2014   AST 30 06/30/2014   NA 135 07/04/2014   K 4.4 07/04/2014   CL 99 07/04/2014   CREATININE 1.55* 07/04/2014   BUN 32* 07/04/2014   CO2 25 07/04/2014    TSH 1.791 06/29/2014   HGBA1C 6.0* 06/29/2014   reviewed bp reading 120 and one 98 pulse 80s  ASSESSMENT AND PLAN:  Discussed the following assessment and plan:  Pneumonia due to Haemophilus influenzae - post hosp loks great today  - Plan: CBC with Differential/Platelet, DG Chest 2 View  Renal insufficiency - better at peak 1.9  recheck today - Plan: Basic metabolic panel, CBC with Differential/Platelet  Dementia, without behavioral disturbance  Skin sore - doesnt look like typical pressure sore  but in a line horizontal  healing  medal area still active  contiue local care   Orthostatic hypotension - no s x  consider dc microzide but concern about fluid retention  that was in hosptial .seems to be doing quite well happiy and mobile not dizzy Repeat labs continue lesion care  Disc whether to stay on  microzide will continue as she looks so well and  Concern about fluid retention returning .  Get fu xcxray as advise dnand fu if 4 weeks or so .  -Patient advised to return or notify health care team  if symptoms worsen ,persist or new concerns arise. Total visit 53mins > 50% spent counseling and coordinating care as indicated in above note and in instructions to patient .   Disc continuing med and fu of what to look for .  Holding the tramadol if not coughing .   Patient Instructions  Monitor  Her bp drops when she stands up  If low below 100  and  She has dizziness .  Consider  Holding the fluid pill for  2-3 weeks and see how this goes with the bp readings.  But since.  She should get follow up chest x ray     Week of may 25th  Then ROV week of may 31st or thereabout s   Skin area  Keep antibiotic  New ocream and covered looks like healing at this time  Gentle  cleaning  No peroxide or alcohol  But call if getting worse and considier other options. .  Looks like  healing boils   Home health can sometimes,es  Help chjeck on this but  You are doing a good job.     Standley Brooking. Jeramey Lanuza  M.D.

## 2014-07-14 NOTE — Patient Instructions (Addendum)
Monitor  Her bp drops when she stands up  If low below 100  and  She has dizziness .  Consider  Holding the fluid pill for  2-3 weeks and see how this goes with the bp readings.  But since.  She should get follow up chest x ray     Week of may 25th  Then ROV week of may 31st or thereabout s   Skin area   Keep antibiotic  New ocream and covered looks like healing at this time  Gentle  cleaning  No peroxide or alcohol  But call if getting worse and considier other options. .  Looks like  healing boils   Home health can sometimes,es  Help chjeck on this but  You are doing a good job.

## 2014-07-15 DIAGNOSIS — F0390 Unspecified dementia without behavioral disturbance: Secondary | ICD-10-CM | POA: Diagnosis not present

## 2014-07-15 LAB — BASIC METABOLIC PANEL
BUN: 25 mg/dL — ABNORMAL HIGH (ref 6–23)
CO2: 28 meq/L (ref 19–32)
Calcium: 9.6 mg/dL (ref 8.4–10.5)
Chloride: 100 mEq/L (ref 96–112)
Creatinine, Ser: 1.41 mg/dL — ABNORMAL HIGH (ref 0.40–1.20)
GFR: 38.01 mL/min — ABNORMAL LOW (ref >60.00)
Glucose, Bld: 121 mg/dL — ABNORMAL HIGH (ref 70–99)
POTASSIUM: 3.6 meq/L (ref 3.5–5.1)
SODIUM: 138 meq/L (ref 135–145)

## 2014-07-15 LAB — CBC WITH DIFFERENTIAL/PLATELET
BASOS PCT: 0.9 % (ref 0.0–3.0)
Basophils Absolute: 0.1 10*3/uL (ref 0.0–0.1)
EOS ABS: 0.2 10*3/uL (ref 0.0–0.7)
Eosinophils Relative: 2.1 % (ref 0.0–5.0)
HCT: 39.5 % (ref 36.0–46.0)
Hemoglobin: 13.2 g/dL (ref 12.0–15.0)
LYMPHS PCT: 16.1 % (ref 12.0–46.0)
Lymphs Abs: 1.3 10*3/uL (ref 0.7–4.0)
MCHC: 33.3 g/dL (ref 30.0–36.0)
MCV: 84.9 fl (ref 78.0–100.0)
Monocytes Absolute: 0.9 10*3/uL (ref 0.1–1.0)
Monocytes Relative: 11.4 % (ref 3.0–12.0)
NEUTROS PCT: 69.5 % (ref 43.0–77.0)
Neutro Abs: 5.6 10*3/uL (ref 1.4–7.7)
Platelets: 492 10*3/uL — ABNORMAL HIGH (ref 150.0–400.0)
RBC: 4.65 Mil/uL (ref 3.87–5.11)
RDW: 13.2 % (ref 11.5–15.5)
WBC: 8.1 10*3/uL (ref 4.0–10.5)

## 2014-07-15 NOTE — Telephone Encounter (Signed)
Saw ms [place] yesterday  Please report this information  To Herbert Deaner  Mr hester said to contact Herbert Deaner [phone]  I agree that skin is healing and given bactroban  And  If continuing to heal ok  Decided to stay on hctz for now cause of the issue of  Fluid retention should continue to monitor   SEE inst below   Patient Instructions  Monitor Her bp drops when she stands up If low below 100 and She has dizziness .  Consider Holding the fluid pill for 2-3 weeks and see how this goes with the bp readings. But since.  She should get follow up chest x ray Week of may 25th  Then ROV week of may 31st or thereabout s   Skin area Keep antibiotic New ocream and covered looks like healing at this time Gentle cleaning No peroxide or alcohol  But call if getting worse and considier other options. . Looks like healing boils  Home health can sometimes,es Help chjeck on this but You are doing a good job.     Standley Brooking. Hammad Finkler M.D.

## 2014-07-16 ENCOUNTER — Ambulatory Visit: Payer: Medicare Other | Admitting: Internal Medicine

## 2014-07-16 NOTE — Telephone Encounter (Signed)
LM for Kelly Conrad to return my call.

## 2014-07-16 NOTE — Telephone Encounter (Signed)
Spoke to [place].  Notified him of all.  No questions at this time.

## 2014-07-20 DIAGNOSIS — H25013 Cortical age-related cataract, bilateral: Secondary | ICD-10-CM | POA: Diagnosis not present

## 2014-07-21 DIAGNOSIS — I10 Essential (primary) hypertension: Secondary | ICD-10-CM | POA: Diagnosis not present

## 2014-07-21 DIAGNOSIS — E785 Hyperlipidemia, unspecified: Secondary | ICD-10-CM | POA: Diagnosis not present

## 2014-07-21 DIAGNOSIS — F0390 Unspecified dementia without behavioral disturbance: Secondary | ICD-10-CM | POA: Diagnosis not present

## 2014-07-21 DIAGNOSIS — J45909 Unspecified asthma, uncomplicated: Secondary | ICD-10-CM | POA: Diagnosis not present

## 2014-07-21 DIAGNOSIS — K219 Gastro-esophageal reflux disease without esophagitis: Secondary | ICD-10-CM | POA: Diagnosis not present

## 2014-07-22 LAB — CULTURE, BLOOD (ROUTINE X 2)

## 2014-08-04 ENCOUNTER — Ambulatory Visit (INDEPENDENT_AMBULATORY_CARE_PROVIDER_SITE_OTHER)
Admission: RE | Admit: 2014-08-04 | Discharge: 2014-08-04 | Disposition: A | Discharge status: Home / Self Care | Payer: Medicare Other | Source: Ambulatory Visit | Attending: Internal Medicine | Admitting: Internal Medicine

## 2014-08-04 DIAGNOSIS — J14 Pneumonia due to Hemophilus influenzae: Secondary | ICD-10-CM

## 2014-08-04 DIAGNOSIS — J45909 Unspecified asthma, uncomplicated: Secondary | ICD-10-CM | POA: Diagnosis not present

## 2014-08-06 ENCOUNTER — Other Ambulatory Visit: Payer: Self-pay | Admitting: Internal Medicine

## 2014-08-06 ENCOUNTER — Telehealth: Payer: Self-pay | Admitting: Internal Medicine

## 2014-08-06 MED ORDER — DONEPEZIL HCL 10 MG PO TABS: 10.0000 mg | ORAL_TABLET | Freq: Every evening | ORAL | Status: DC | PRN

## 2014-08-06 NOTE — Telephone Encounter (Signed)
Rx sent to pharmacy. Mr. Kelly Conrad is aware.

## 2014-08-06 NOTE — Telephone Encounter (Addendum)
Pt is out of aricept 10 mg #30 . Please call med into [facility]. Pt has an appt with dr Regis Bill on 08-10-14. Please call jimmy hester the [facility] once rx has been call or sent

## 2014-08-10 ENCOUNTER — Other Ambulatory Visit: Payer: Self-pay | Admitting: Family Medicine

## 2014-08-10 ENCOUNTER — Ambulatory Visit: Payer: Medicare Other | Admitting: Internal Medicine

## 2014-08-10 DIAGNOSIS — J90 Pleural effusion, not elsewhere classified: Secondary | ICD-10-CM

## 2014-08-11 ENCOUNTER — Ambulatory Visit (INDEPENDENT_AMBULATORY_CARE_PROVIDER_SITE_OTHER)
Admission: RE | Admit: 2014-08-11 | Discharge: 2014-08-11 | Disposition: A | Discharge status: Home / Self Care | Payer: Medicare Other | Source: Ambulatory Visit | Attending: Internal Medicine | Admitting: Internal Medicine

## 2014-08-11 DIAGNOSIS — J90 Pleural effusion, not elsewhere classified: Secondary | ICD-10-CM

## 2014-08-11 DIAGNOSIS — J948 Other specified pleural conditions: Secondary | ICD-10-CM | POA: Diagnosis not present

## 2014-08-11 DIAGNOSIS — J984 Other disorders of lung: Secondary | ICD-10-CM | POA: Diagnosis not present

## 2014-08-12 ENCOUNTER — Other Ambulatory Visit: Payer: Self-pay | Admitting: Family Medicine

## 2014-08-12 DIAGNOSIS — J90 Pleural effusion, not elsewhere classified: Secondary | ICD-10-CM

## 2014-08-13 ENCOUNTER — Telehealth: Payer: Self-pay | Admitting: Internal Medicine

## 2014-08-13 NOTE — Telephone Encounter (Signed)
Pt and [name] stopped by the office this evening.  They informed us the pt will be having cataract surgery on the 9th.  Wanted to know what to do about the pulmonary appointment.  Advised [name] need to call her back to check their availability and schedule around surgery.  If pt experiences sob than should be seen in ED.  Will follow up with Shantal to make sure [name] has scheduled an appt.

## 2014-08-13 NOTE — Telephone Encounter (Signed)
Kelly Conrad called to let you know that she was unable to complete the appointment set up for the referral that Dr. Regis Bill had sent. Kelly Conrad told Kelly Conrad his whole life story and said that Kelly Conrad is having eye surgery on June 9th and Follow up with the eye doctor on June 10th. He doesn't want her to catch pneumonia again. [name] number is [phone] if you needed further information.

## 2014-08-16 NOTE — Telephone Encounter (Signed)
Pt has upcoming appt on 08/27/14 with Dr. Melvyn Novas

## 2014-08-19 DIAGNOSIS — H21561 Pupillary abnormality, right eye: Secondary | ICD-10-CM | POA: Diagnosis not present

## 2014-08-19 DIAGNOSIS — H269 Unspecified cataract: Secondary | ICD-10-CM | POA: Diagnosis not present

## 2014-08-19 DIAGNOSIS — H25011 Cortical age-related cataract, right eye: Secondary | ICD-10-CM | POA: Diagnosis not present

## 2014-08-19 DIAGNOSIS — H25811 Combined forms of age-related cataract, right eye: Secondary | ICD-10-CM | POA: Diagnosis not present

## 2014-08-27 ENCOUNTER — Other Ambulatory Visit (INDEPENDENT_AMBULATORY_CARE_PROVIDER_SITE_OTHER): Payer: Medicare Other

## 2014-08-27 ENCOUNTER — Ambulatory Visit (INDEPENDENT_AMBULATORY_CARE_PROVIDER_SITE_OTHER): Payer: Medicare Other | Admitting: Internal Medicine

## 2014-08-27 VITALS — BP 140/70 | HR 75 | Ht 63.0 in | Wt 160.3 lb

## 2014-08-27 DIAGNOSIS — J90 Pleural effusion, not elsewhere classified: Secondary | ICD-10-CM | POA: Diagnosis not present

## 2014-08-27 DIAGNOSIS — J14 Pneumonia due to Hemophilus influenzae: Secondary | ICD-10-CM | POA: Diagnosis not present

## 2014-08-27 LAB — CBC WITH DIFFERENTIAL/PLATELET
BASOS ABS: 0 10*3/uL (ref 0.0–0.1)
Basophils Relative: 0.6 % (ref 0.0–3.0)
Eosinophils Absolute: 0.2 10*3/uL (ref 0.0–0.7)
Eosinophils Relative: 3 % (ref 0.0–5.0)
HEMATOCRIT: 41.3 % (ref 36.0–46.0)
Hemoglobin: 13.3 g/dL (ref 12.0–15.0)
LYMPHS ABS: 2.3 10*3/uL (ref 0.7–4.0)
Lymphocytes Relative: 31 % (ref 12.0–46.0)
MCHC: 32.3 g/dL (ref 30.0–36.0)
MCV: 85.7 fl (ref 78.0–100.0)
MONO ABS: 0.7 10*3/uL (ref 0.1–1.0)
MONOS PCT: 9.4 % (ref 3.0–12.0)
Neutro Abs: 4.2 10*3/uL (ref 1.4–7.7)
Neutrophils Relative %: 56 % (ref 43.0–77.0)
PLATELETS: 237 10*3/uL (ref 150.0–400.0)
RBC: 4.82 Mil/uL (ref 3.87–5.11)
RDW: 15.8 % — AB (ref 11.5–15.5)
WBC: 7.6 10*3/uL (ref 4.0–10.5)

## 2014-08-27 LAB — BASIC METABOLIC PANEL
BUN: 29 mg/dL — ABNORMAL HIGH (ref 6–23)
CHLORIDE: 105 meq/L (ref 96–112)
CO2: 29 meq/L (ref 19–32)
Calcium: 9.6 mg/dL (ref 8.4–10.5)
Creatinine, Ser: 1.31 mg/dL — ABNORMAL HIGH (ref 0.40–1.20)
GFR: 41.37 mL/min — ABNORMAL LOW (ref >60.00)
Glucose, Bld: 96 mg/dL (ref 70–99)
Potassium: 4.8 mEq/L (ref 3.5–5.1)
SODIUM: 139 meq/L (ref 135–145)

## 2014-08-27 LAB — SEDIMENTATION RATE: Sed Rate: 38 mm/hr — ABNORMAL HIGH (ref 0–22)

## 2014-08-27 NOTE — Telephone Encounter (Signed)
[name] caregiver wanted you to know pt made her appt w/ dr wert today and will fup in  30 days  (7/15)on xrays. Pt had labs done today too.  [name] states if you need to see her, just let him know.

## 2014-08-27 NOTE — Progress Notes (Signed)
Subjective:    Patient ID: Kelly Conrad, female    DOB: 1931/04/20,    MRN: [id number]  HPI  Admit date: 06/28/2014 Discharge date: 07/04/2014   Discharge Diagnoses:  Principal Problem:  CAP (community acquired pneumonia)/ H flu on BC x 1 /2 ( beta lact neg)  Essential hypertension  Osteoporosis  Dementia  Benign hypertensive renal disease  Sepsis  Acute respiratory failure  Shortness of breath      Filed Weights   06/28/14 2230 06/29/14 0106  Weight: 70.761 kg (156 lb) 74.571 kg (164 lb 6.4 oz)    History of present illness:  Chief Complaint: fever 81 yo female h/o dementia presented to ER with one day of high fever and cough  Hospital Course:  Acute hypoxic resp failure -due to CAP and Diastolic CHF from fluid resuscitation -much improved -got multiple lasix doses 4/19, now off -ECHO with normal EF and wall motion -see Abx below  Community acquired pneumonia -Blood Cx with hemophilus influenzae 1/2 -clinically improved, was initially on broad spectrum antibiotics then change to PO Levaquin  Acute diastolic CHF -due to volume resuscitation -improved, ECHO with normal EF -only required 2 doses of Iv lasix on 2nd day of hospitalization, off diuretics since. -need for long term diuretics to be assessed upon follow up  Sepsis:  -with fever/leukocytosis/lactic acidosis on admission -improved  ARF on CKF III -cr 1.9 on admission, improved to 1.5 at discharge  Hyperglycemia: -no prior diagnosis of DM2, hbaic 6.0  H/o Dementia: mild, has been on aricept for about a year, AAOX3, but impaired memory      08/27/2014 1st [facility] office visit/ Wert   Chief Complaint  Patient presents with  . Pulmonary Consult    Referred by Dr Regis Bill. Family reports recent PNA treated by antibiotics. Pt c/o SOB with activity, and productive cough   never smoker with  h/o cough x 6 years worse in last 6 months eval by Dr Joni Fears pref attributed to  ACei,  better  with tramadol > resolved at this point and feels back to baseline  No obvious  day to day or daytime variabilty or assoc cp or chest tightness, subjective wheeze overt sinus or hb symptoms. No unusual exp hx or h/o childhood pna/ asthma or knowledge of premature birth.  Sleeping ok without nocturnal  or early am exacerbation  of respiratory  c/o's or need for noct saba. Also denies any obvious fluctuation of symptoms with weather or environmental changes or other aggravating or alleviating factors except as outlined above   Current Medications, Allergies, Complete Past Medical History, Past Surgical History, Family History, and Social History were reviewed in [employer] record.             Review of Systems  Constitutional: Negative for fever, chills and unexpected weight change.  HENT: Negative for congestion, dental problem, ear pain, nosebleeds, postnasal drip, rhinorrhea, sinus pressure, sneezing, sore throat, trouble swallowing and voice change.   Eyes: Negative for visual disturbance.  Respiratory: Positive for cough and shortness of breath. Negative for choking.   Cardiovascular: Negative for chest pain and leg swelling.  Gastrointestinal: Negative for vomiting, abdominal pain and diarrhea.  Genitourinary: Negative for difficulty urinating.  Musculoskeletal: Negative for arthralgias.  Skin: Negative for rash.  Neurological: Negative for tremors, syncope and headaches.  Hematological: Does not bruise/bleed easily.       Objective:   Physical Exam  amb wf nad/ very stoic  Wt Readings from  Last 3 Encounters:  07/14/14 155 lb 14.4 oz (70.716 kg)  06/29/14 164 lb 6.4 oz (74.571 kg)  03/02/14 156 lb 1.6 oz (70.806 kg)    Vital signs reviewed  HEENT: nl dentition, turbinates, and orophanx. Nl external ear canals without cough reflex   NECK :  without JVD/Nodes/TM/ nl carotid upstrokes bilaterally   LUNGS: no acc muscle use,  decreased bs / dullness R base    CV:  RRR  no s3 or murmur or increase in P2, no edema   ABD:  soft and nontender with nl excursion in the supine position. No bruits or organomegaly, bowel sounds nl  MS:  warm without deformities, calf tenderness, cyanosis or clubbing  SKIN: warm and dry without lesions    NEURO:  alert, approp, no deficits      I personally reviewed images and agree with radiology impression as follows:  CT chest s contrast  08/11/14   Small loculated pleural effusion is noted posteriorly in the right lung base with adjacent subsegmental atelectasis or inflammation of the right lower lobe.     Labs ordered/ reviewed:     Recent Labs Lab 08/27/14 1446  NA 139  K 4.8  CL 105  CO2 29  BUN 29*  CREATININE 1.31*  GLUCOSE 96    Recent Labs Lab 08/27/14 1446  HGB 13.3  HCT 41.3  WBC 7.6  PLT 237.0     Lab Results  Component Value Date   TSH 1.791 06/29/2014        Lab Results  Component Value Date   ESRSEDRATE 38* 08/27/2014      Assessment & Plan:

## 2014-08-27 NOTE — Patient Instructions (Signed)
Please remember to go to the lab  department downstairs for your tests - we will call you with the results when they are available.  Please schedule a follow up office visit in 4 weeks, sooner if needed with cxr on return

## 2014-08-28 NOTE — Assessment & Plan Note (Addendum)
CT chest 08/11/14 Small loculated pleural effusion is noted posteriorly in the right lung base with adjacent subsegmental atelectasis or inflammation of the right lower lobe.  I had an extended discussion with the patient reviewing and caretaker all relevant studies completed to date and  lasting  52 m  1) despite rocky course, she's about where she should be with only a very small late phase loculated pl effusion on R which is basically asymptomatic  2) natural hx of pna/ parapneumonic effusion reviewed  3) f/u cxr in 4 weeks Each maintenance medication was reviewed in detail including most importantly the difference between maintenance and as needed and under what circumstances the prns are to be used.  Please see instructions for details which were reviewed in writing and the patient given a copy.

## 2014-08-28 NOTE — Assessment & Plan Note (Signed)
08/27/14 ESR = 38  Never tapped up until this point and no indication for this now.  

## 2014-08-30 ENCOUNTER — Encounter: Payer: Self-pay | Admitting: Internal Medicine

## 2014-08-30 NOTE — Progress Notes (Signed)
Quick Note:  Spoke with pt and notified of results per Dr. Wert. Pt verbalized understanding and denied any questions.  ______ 

## 2014-08-31 ENCOUNTER — Ambulatory Visit: Payer: Medicare Other | Admitting: Internal Medicine

## 2014-09-02 ENCOUNTER — Other Ambulatory Visit: Payer: Self-pay | Admitting: Internal Medicine

## 2014-09-03 NOTE — Telephone Encounter (Signed)
Sent to the pharmacy by e-scribe. 

## 2014-09-09 DIAGNOSIS — H25012 Cortical age-related cataract, left eye: Secondary | ICD-10-CM | POA: Diagnosis not present

## 2014-09-09 DIAGNOSIS — H2512 Age-related nuclear cataract, left eye: Secondary | ICD-10-CM | POA: Diagnosis not present

## 2014-09-09 DIAGNOSIS — H25812 Combined forms of age-related cataract, left eye: Secondary | ICD-10-CM | POA: Diagnosis not present

## 2014-09-09 DIAGNOSIS — H21562 Pupillary abnormality, left eye: Secondary | ICD-10-CM | POA: Diagnosis not present

## 2014-09-24 ENCOUNTER — Encounter: Payer: Self-pay | Admitting: Internal Medicine

## 2014-09-24 ENCOUNTER — Ambulatory Visit (INDEPENDENT_AMBULATORY_CARE_PROVIDER_SITE_OTHER)
Admission: RE | Admit: 2014-09-24 | Discharge: 2014-09-24 | Disposition: A | Discharge status: Home / Self Care | Payer: Medicare Other | Source: Ambulatory Visit | Attending: Internal Medicine | Admitting: Internal Medicine

## 2014-09-24 ENCOUNTER — Ambulatory Visit (INDEPENDENT_AMBULATORY_CARE_PROVIDER_SITE_OTHER): Payer: Medicare Other | Admitting: Internal Medicine

## 2014-09-24 VITALS — BP 112/64 | HR 66 | Ht 61.0 in | Wt 164.2 lb

## 2014-09-24 DIAGNOSIS — J14 Pneumonia due to Hemophilus influenzae: Secondary | ICD-10-CM | POA: Diagnosis not present

## 2014-09-24 DIAGNOSIS — E669 Obesity, unspecified: Secondary | ICD-10-CM | POA: Diagnosis not present

## 2014-09-24 DIAGNOSIS — J189 Pneumonia, unspecified organism: Secondary | ICD-10-CM | POA: Diagnosis not present

## 2014-09-24 DIAGNOSIS — J90 Pleural effusion, not elsewhere classified: Secondary | ICD-10-CM | POA: Diagnosis not present

## 2014-09-24 NOTE — Assessment & Plan Note (Signed)
CT chest 08/11/14 Small loculated pleural effusion is noted posteriorly in the right lung base with adjacent subsegmental atelectasis or inflammation of the right lower lobe. - 08/27/14 esr 38   - cxr 09/24/2014 improved   I had an extended final summary discussion with the patient and POA [name] reviewing all relevant studies completed to date and  lasting 15 to 20 minutes of a 25 minute visit on the following issues:    1) clearly this was related to H flu pna with documented assoc bacteremia and though this was likely complex there is no ongoing evidence for any lingering pleural infection or inflammatory/space issue though she may have sign residual R plueral scar ( doubt it would have been much smaller even with vats)   2) pulmonary f/u can be prn

## 2014-09-24 NOTE — Assessment & Plan Note (Signed)
Body mass index is 31.04 kg/(m^2).  Lab Results  Component Value Date   TSH 1.791 06/29/2014     Can lead to gerd tendency/ doe/ needs to achieve and maintain neg calorie balance > defer f/u to  primary care

## 2014-09-24 NOTE — Progress Notes (Addendum)
Subjective:    Patient ID: Kelly Conrad, female    DOB: [birth year]     MRN: [id number]   History of Present Illness   Admit date: 06/28/2014 Discharge date: 07/04/2014   Discharge Diagnoses:  Principal Problem:  CAP (community acquired pneumonia)/ H flu on BC x 1 /2 ( beta lact neg)  Essential hypertension  Osteoporosis  Dementia  Benign hypertensive renal disease  Sepsis  Acute respiratory failure  Shortness of breath      Filed Weights   06/28/14 2230 06/29/14 0106  Weight: 70.761 kg (156 lb) 74.571 kg (164 lb 6.4 oz)    History of present illness:  Chief Complaint: fever 81 yo female h/o dementia presented to ER with one day of high fever and cough  Hospital Course:  Acute hypoxic resp failure -due to CAP and Diastolic CHF from fluid resuscitation -much improved -got multiple lasix doses 4/19, now off -ECHO with normal EF and wall motion -see Abx below  Community acquired pneumonia -Blood Cx with hemophilus influenzae 1/2 -clinically improved, was initially on broad spectrum antibiotics then change to PO Levaquin  Acute diastolic CHF -due to volume resuscitation -improved, ECHO with normal EF -only required 2 doses of Iv lasix on 2nd day of hospitalization, off diuretics since. -need for long term diuretics to be assessed upon follow up  Sepsis:  -with fever/leukocytosis/lactic acidosis on admission -improved  ARF on CKF III -cr 1.9 on admission, improved to 1.5 at discharge  Hyperglycemia: -no prior diagnosis of DM2, hbaic 6.0  H/o Dementia: mild, has been on aricept for about a year, AAOX3, but impaired memory      08/27/2014 1st [facility] office visit/ Sarahbeth Cashin   Chief Complaint  Patient presents with  . Pulmonary Consult    Referred by Dr Regis Bill. Family reports recent PNA treated by antibiotics. Pt c/o SOB with activity, and productive cough  never smoker with  h/o cough x 6 years worse in last 6 months eval by Dr  Joni Fears pref attributed to ACei,  better  with tramadol > resolved at this point and feels back to baseline Imp late parapneumonic effusion  rec No change rx   09/24/2014 f/u ov/Janelle Spellman re: f/u effusion  Chief Complaint  Patient presents with  . Follow-up    CXR done today. Pt states her breathing has improved and not coughing as much and sputum is clear.  No new co's today.    min need for mucinex Not limited by breathing from desired activities  / swallowing ok  and sleeping fine now flat   No obvious day to day or daytime variability or assoc chronic cough or cp or chest tightness, subjective wheeze or overt sinus or hb symptoms. No unusual exp hx or h/o childhood pna/ asthma or knowledge of premature birth.  Sleeping ok without nocturnal  or early am exacerbation  of respiratory  c/o's or need for noct saba. Also denies any obvious fluctuation of symptoms with weather or environmental changes or other aggravating or alleviating factors except as outlined above   Current Medications, Allergies, Complete Past Medical History, Past Surgical History, Family History, and Social History were reviewed in [employer] record.  ROS  The following are not active complaints unless bolded sore throat, dysphagia, dental problems, itching, sneezing,  nasal congestion or excess/ purulent secretions, ear ache,   fever, chills, sweats, unintended wt loss, classically pleuritic or exertional cp, hemoptysis,  orthopnea pnd or leg swelling, presyncope, palpitations, abdominal pain,  anorexia, nausea, vomiting, diarrhea  or change in bowel or bladder habits, change in stools or urine, dysuria,hematuria,  rash, arthralgias, visual complaints, headache, numbness, weakness or ataxia or problems with walking or coordination,  change in mood/affect or memory.                         Objective:   Physical Exam  amb wf nad/ very stoic  09/24/2014       164  Wt Readings from Last 3  Encounters:  07/14/14 155 lb 14.4 oz (70.716 kg)  06/29/14 164 lb 6.4 oz (74.571 kg)  03/02/14 156 lb 1.6 oz (70.806 kg)    Vital signs reviewed  HEENT: full dentures /nl turbinates, and orophanx. Nl external ear canals without cough reflex   NECK :  without JVD/Nodes/TM/ nl carotid upstrokes bilaterally   LUNGS: no acc muscle use, better aeration R Base     CV:  RRR  no s3 or murmur or increase in P2, no edema   ABD:  soft and nontender with nl excursion in the supine position. No bruits or organomegaly, bowel sounds nl  MS:  warm without deformities, calf tenderness, cyanosis or clubbing  SKIN: warm and dry without lesions    NEURO:  alert, approp, no deficits      I personally reviewed images and agree with radiology impression as follows:  CT chest s contrast  08/11/14   Small loculated pleural effusion is noted posteriorly in the right lung base with adjacent subsegmental atelectasis or inflammation of the right lower lobe.   CXR PA and Lateral:   09/24/2014 :     I personally reviewed images and agree with radiology impression as follows:   Marked improvement aeration on R with min residual pleural scar along post R lower chest wall seen best on lateral view      Labs  reviewed:  Lab 08/27/14 1446  NA 139  K 4.8  CL 105  CO2 29  BUN 29*  CREATININE 1.31*  GLUCOSE 96    Recent Labs Lab 08/27/14 1446  HGB 13.3  HCT 41.3  WBC 7.6  PLT 237.0     Lab Results  Component Value Date   TSH 1.791 06/29/2014        Lab Results  Component Value Date   ESRSEDRATE 38* 08/27/2014      Assessment & Plan:

## 2014-09-24 NOTE — Patient Instructions (Signed)
All follow up per Dr Regis Bill and return here as needed for cough or shortness of breath, pain with breathing

## 2014-09-28 ENCOUNTER — Other Ambulatory Visit: Payer: Self-pay | Admitting: Internal Medicine

## 2014-09-28 NOTE — Telephone Encounter (Signed)
Sent to the pharmacy by e-scribe. 

## 2014-11-03 ENCOUNTER — Telehealth: Payer: Self-pay | Admitting: *Deleted

## 2014-11-03 MED ORDER — HYDROCHLOROTHIAZIDE 12.5 MG PO CAPS: ORAL_CAPSULE | ORAL | Status: DC

## 2014-11-03 NOTE — Telephone Encounter (Signed)
90 day rx sent to the pharmacy. 

## 2014-11-03 NOTE — Telephone Encounter (Signed)
Refill request for hydrochlorothiazide (MICROZIDE) 12.5 MG capsule Patient requesting a 90 day supply [facility]

## 2014-11-11 ENCOUNTER — Encounter: Payer: Self-pay | Admitting: Internal Medicine

## 2014-11-11 ENCOUNTER — Ambulatory Visit (INDEPENDENT_AMBULATORY_CARE_PROVIDER_SITE_OTHER): Payer: Medicare Other | Admitting: Internal Medicine

## 2014-11-11 VITALS — BP 146/70 | Temp 97.5°F | Ht 61.0 in | Wt 162.7 lb

## 2014-11-11 DIAGNOSIS — Z8701 Personal history of pneumonia (recurrent): Secondary | ICD-10-CM | POA: Insufficient documentation

## 2014-11-11 DIAGNOSIS — I129 Hypertensive chronic kidney disease with stage 1 through stage 4 chronic kidney disease, or unspecified chronic kidney disease: Secondary | ICD-10-CM

## 2014-11-11 DIAGNOSIS — F0390 Unspecified dementia without behavioral disturbance: Secondary | ICD-10-CM

## 2014-11-11 DIAGNOSIS — Z23 Encounter for immunization: Secondary | ICD-10-CM | POA: Diagnosis not present

## 2014-11-11 DIAGNOSIS — N289 Disorder of kidney and ureter, unspecified: Secondary | ICD-10-CM | POA: Diagnosis not present

## 2014-11-11 DIAGNOSIS — E785 Hyperlipidemia, unspecified: Secondary | ICD-10-CM

## 2014-11-11 LAB — CBC WITH DIFFERENTIAL/PLATELET
Basophils Absolute: 0 10*3/uL (ref 0.0–0.1)
Basophils Relative: 0.4 % (ref 0.0–3.0)
EOS ABS: 0.2 10*3/uL (ref 0.0–0.7)
Eosinophils Relative: 3 % (ref 0.0–5.0)
HEMATOCRIT: 45.4 % (ref 36.0–46.0)
Hemoglobin: 14.9 g/dL (ref 12.0–15.0)
LYMPHS PCT: 27.3 % (ref 12.0–46.0)
Lymphs Abs: 2 10*3/uL (ref 0.7–4.0)
MCHC: 32.9 g/dL (ref 30.0–36.0)
MCV: 86.2 fl (ref 78.0–100.0)
MONO ABS: 0.6 10*3/uL (ref 0.1–1.0)
Monocytes Relative: 7.8 % (ref 3.0–12.0)
NEUTROS ABS: 4.6 10*3/uL (ref 1.4–7.7)
Neutrophils Relative %: 61.5 % (ref 43.0–77.0)
Platelets: 188 10*3/uL (ref 150.0–400.0)
RBC: 5.27 Mil/uL — ABNORMAL HIGH (ref 3.87–5.11)
RDW: 15.6 % — ABNORMAL HIGH (ref 11.5–15.5)
WBC: 7.4 10*3/uL (ref 4.0–10.5)

## 2014-11-11 LAB — LIPID PANEL
Cholesterol: 226 mg/dL — ABNORMAL HIGH (ref 0–200)
HDL: 50.5 mg/dL (ref >39.00)
NonHDL: 175.28
Total CHOL/HDL Ratio: 4
Triglycerides: 231 mg/dL — ABNORMAL HIGH (ref 0.0–149.0)
VLDL: 46.2 mg/dL — ABNORMAL HIGH (ref 0.0–40.0)

## 2014-11-11 LAB — TSH: TSH: 2.14 u[IU]/mL (ref 0.35–4.50)

## 2014-11-11 LAB — BASIC METABOLIC PANEL
BUN: 26 mg/dL — ABNORMAL HIGH (ref 6–23)
CO2: 29 mEq/L (ref 19–32)
Calcium: 9.6 mg/dL (ref 8.4–10.5)
Chloride: 105 mEq/L (ref 96–112)
Creatinine, Ser: 1.49 mg/dL — ABNORMAL HIGH (ref 0.40–1.20)
GFR: 35.64 mL/min — ABNORMAL LOW (ref >60.00)
Glucose, Bld: 95 mg/dL (ref 70–99)
POTASSIUM: 4.9 meq/L (ref 3.5–5.1)
Sodium: 142 mEq/L (ref 135–145)

## 2014-11-11 LAB — LDL CHOLESTEROL, DIRECT: LDL DIRECT: 107 mg/dL

## 2014-11-11 LAB — HEMOGLOBIN A1C: HEMOGLOBIN A1C: 5.8 % (ref 4.6–6.5)

## 2014-11-11 NOTE — Progress Notes (Signed)
Pre visit review using our clinic review tool, if applicable. No additional management support is needed unless otherwise documented below in the visit note.  Chief Complaint  Patient presents with  . Follow-up    HPI: Kelly Conrad 81 y.o.  comes in for chronic disease/ medication management  Hx of hyperlipidemia ht renal insufficicney  Dementia   Hearing diffcultys with hearing aid and hx of h flu sepsis pna in spring  2016 .  Since last visit she has done well is eating well no respiratory distress she states she gets some cough when air conditioners bloating in her face. She had a follow-up visit with Dr. Melvyn Novas pulmonary who noted some scarring where she had a loculated effusion. Felt no other intervention needed but can use Mucinex as needed. Is on an as-needed basis to follow-up with pulmonary. There are no fevers syncope taking medicines regularly. She isn't wheezing.  Her POA is concerned to make sure there is no bacteria or virus in her bloodstream again. ROS: See pertinent positives and negatives per HPI.  Past Medical History  Diagnosis Date  . Allergy   . Hypertension   . Hyperlipidemia   . GERD (gastroesophageal reflux disease)   . Asthma   . Hx of varicella   . History of positive PPD     gets c xray screen  . Dementia     Family History  Problem Relation Age of Onset  . Hypertension      fhx  . Diabetes Mother   . Stroke Mother   . Diabetes Father   . Edema Father     legs    Social History   Social History  . Marital Status: Single    Spouse Name: N/A  . Number of Children: N/A  . Years of Education: N/A   Social History Main Topics  . Smoking status: Never Smoker   . Smokeless tobacco: Never Used  . Alcohol Use: No  . Drug Use: No  . Sexual Activity: No   Other Topics Concern  . 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 Kelly Conrad    He is the main caretaker. Oliver  took care of his mom  Who has since passed away.   He has some vision problems and partial amputation but  No mobiilty problems       Neg ets FA safely stored smoke alarm   No tob or etoh.    Outpatient Prescriptions Prior to Visit  Medication Sig Dispense Refill  . aspirin 81 MG EC tablet Take 81 mg by mouth daily.      . Calcium Carb-Cholecalciferol 600-800 MG-UNIT TABS Take 1 tablet by mouth daily.    Marland Kitchen donepezil (ARICEPT) 10 MG tablet TAKE 1 TABLET BY MOUTH AT BEDTIME AS NEEDED 30 tablet 0  . hydrochlorothiazide (MICROZIDE) 12.5 MG capsule TAKE 1 CAPSULE (12.5 MG TOTAL) BY MOUTH DAILY. 90 capsule 0  . Multiple Vitamins-Minerals (CENTRUM SILVER PO) Take 1 tablet by mouth daily.     . simvastatin (ZOCOR) 40 MG tablet TAKE 1 TABLET BY MOUTH AT BEDTIME 90 tablet 2  . guaiFENesin (MUCINEX) 600 MG 12 hr tablet Take 600 mg by mouth 2 (two) times daily.     . traMADol (ULTRAM) 50 MG tablet Take 1 tablet (50 mg total) by mouth at bedtime as needed. (Patient not taking: Reported on 11/11/2014) 30 tablet 5  No facility-administered medications prior to visit.     EXAM:  BP 146/70 mmHg  Temp(Src) 97.5 F (36.4 C) (Oral)  Ht 5\' 1"  (1.549 m)  Wt 162 lb 11.2 oz (73.8 kg)  BMI 30.76 kg/m2  Body mass index is 30.76 kg/(m^2).  GENERAL: vitals reviewed and listed above, alert, oriented, appears well hydrated and in no acute distress HEENT: atraumatic, conjunctiva  clear, no obvious abnormalities on inspection of external nose and ears OP : no lesion edema or exudate  NECK: no obvious masses on inspection palpation  LUNGS: clear to auscultation bilaterally, no wheezes, rales or rhonchi, good air movement CV: HRRR, no clubbing cyanosis or  peripheral edema nl cap refill  MS: moves all extremities without noticeable focal  abnormality PSYCH: pleasant and cooperative, no obvious depression or anxiety BP Readings from Last 3 Encounters:  11/11/14 146/70  09/24/14 112/64  08/30/14 140/70   Wt  Readings from Last 3 Encounters:  11/11/14 162 lb 11.2 oz (73.8 kg)  09/24/14 164 lb 3.2 oz (74.481 kg)  08/30/14 160 lb 4.8 oz (72.712 kg)   Lab Results  Component Value Date   WBC 7.6 08/27/2014   HGB 13.3 08/27/2014   HCT 41.3 08/27/2014   PLT 237.0 08/27/2014   GLUCOSE 96 08/27/2014   CHOL 199 03/02/2014   TRIG 176.0* 03/02/2014   HDL 52.30 03/02/2014   LDLDIRECT 115.6 11/24/2012   LDLCALC 112* 03/02/2014   ALT 25 06/30/2014   AST 30 06/30/2014   NA 139 08/27/2014   K 4.8 08/27/2014   CL 105 08/27/2014   CREATININE 1.31* 08/27/2014   BUN 29* 08/27/2014   CO2 29 08/27/2014   TSH 1.791 06/29/2014   HGBA1C 6.0* 06/29/2014    ASSESSMENT AND PLAN:  Discussed the following assessment and plan:  Renal insufficiency - Plan: CBC with Differential/Platelet, Basic metabolic panel, Hemoglobin A1c, Lipid panel, TSH  Dementia, without behavioral disturbance - Appears stable no formal testing hearing things at night. - Plan: CBC with Differential/Platelet, Basic metabolic panel, Hemoglobin A1c, Lipid panel, TSH  Hyperlipidemia - Plan: CBC with Differential/Platelet, Basic metabolic panel, Hemoglobin A1c, Lipid panel, TSH  Benign hypertensive renal disease - Has been controlled monitor no change medicines at this time could consider an arm instead of diuretic. - Plan: CBC with Differential/Platelet, Basic metabolic panel, Hemoglobin A1c, Lipid panel, TSH  Need for prophylactic vaccination and inoculation against influenza - Plan: Flu vaccine HIGH DOSE PF (Fluzone High dose), CBC with Differential/Platelet, Basic metabolic panel, Hemoglobin A1c, Lipid panel, TSH Reassured family there is no  evidence of bloodstream infection at this point and to look for fevers change in mental status illness increasing cough and respiratory distress.  Her hearing aids are working better. She is hearing a loud music at night which according to [name] is not happening however she is not wandering and  evening behavior is not problematic at this time. He would feel more comfortable rechecking her kidney function will also check an A1c because of the history of 6. If everything is good recheck in 6 months or as needed. Flu vaccine.-Patient advised to return or notify health care team  if symptoms worsen ,persist or new concerns arise. Prefer to go slow with any medication changes this time she looks like she is doing well.  Patient Instructions   No change in meds at this time   i think you are doing well .  No evidence of infection today.   Plan  OV in 6 months  Or  As needed   BP Readings from Last 3 Encounters:  11/11/14 146/70  09/24/14 112/64  08/30/14 140/70   Wt Readings from Last 3 Encounters:  11/11/14 162 lb 11.2 oz (73.8 kg)  09/24/14 164 lb 3.2 oz (74.481 kg)  08/30/14 160 lb 4.8 oz (72.712 kg)   Lab Results  Component Value Date   WBC 7.6 08/27/2014   HGB 13.3 08/27/2014   HCT 41.3 08/27/2014   PLT 237.0 08/27/2014   GLUCOSE 96 08/27/2014   CHOL 199 03/02/2014   TRIG 176.0* 03/02/2014   HDL 52.30 03/02/2014   LDLDIRECT 115.6 11/24/2012   LDLCALC 112* 03/02/2014   ALT 25 06/30/2014   AST 30 06/30/2014   NA 139 08/27/2014   K 4.8 08/27/2014   CL 105 08/27/2014   CREATININE 1.31* 08/27/2014   BUN 29* 08/27/2014   CO2 29 08/27/2014   TSH 1.791 06/29/2014   HGBA1C 6.0* 06/29/2014        Christopher Hink K. Gwenivere Hiraldo M.D.

## 2014-11-11 NOTE — Patient Instructions (Addendum)
No change in meds at this time   i think you are doing well .  No evidence of infection today.   Plan  OV in 6 months  Or  As needed   BP Readings from Last 3 Encounters:  11/11/14 146/70  09/24/14 112/64  08/30/14 140/70   Wt Readings from Last 3 Encounters:  11/11/14 162 lb 11.2 oz (73.8 kg)  09/24/14 164 lb 3.2 oz (74.481 kg)  08/30/14 160 lb 4.8 oz (72.712 kg)   Lab Results  Component Value Date   WBC 7.6 08/27/2014   HGB 13.3 08/27/2014   HCT 41.3 08/27/2014   PLT 237.0 08/27/2014   GLUCOSE 96 08/27/2014   CHOL 199 03/02/2014   TRIG 176.0* 03/02/2014   HDL 52.30 03/02/2014   LDLDIRECT 115.6 11/24/2012   LDLCALC 112* 03/02/2014   ALT 25 06/30/2014   AST 30 06/30/2014   NA 139 08/27/2014   K 4.8 08/27/2014   CL 105 08/27/2014   CREATININE 1.31* 08/27/2014   BUN 29* 08/27/2014   CO2 29 08/27/2014   TSH 1.791 06/29/2014   HGBA1C 6.0* 06/29/2014

## 2015-01-20 DIAGNOSIS — L821 Other seborrheic keratosis: Secondary | ICD-10-CM | POA: Diagnosis not present

## 2015-01-20 DIAGNOSIS — L570 Actinic keratosis: Secondary | ICD-10-CM | POA: Diagnosis not present

## 2015-03-03 ENCOUNTER — Other Ambulatory Visit: Payer: Self-pay | Admitting: Internal Medicine

## 2015-03-30 ENCOUNTER — Other Ambulatory Visit: Payer: Self-pay | Admitting: Internal Medicine

## 2015-03-31 NOTE — Telephone Encounter (Signed)
Sent to the pharmacy by e-scribe. 

## 2015-04-19 ENCOUNTER — Encounter (HOSPITAL_COMMUNITY): Payer: Self-pay | Admitting: *Deleted

## 2015-04-19 ENCOUNTER — Emergency Department (HOSPITAL_COMMUNITY): Payer: Medicare Other

## 2015-04-19 ENCOUNTER — Inpatient Hospital Stay (HOSPITAL_COMMUNITY)
Admission: EM | Admit: 2015-04-19 | Discharge: 2015-04-23 | DRG: 481 | Disposition: A | Discharge status: Skilled Nursing Facility | Payer: Medicare Other | Attending: Internal Medicine | Admitting: Internal Medicine

## 2015-04-19 DIAGNOSIS — M6281 Muscle weakness (generalized): Secondary | ICD-10-CM | POA: Diagnosis not present

## 2015-04-19 DIAGNOSIS — Z8249 Family history of ischemic heart disease and other diseases of the circulatory system: Secondary | ICD-10-CM

## 2015-04-19 DIAGNOSIS — W010XXA Fall on same level from slipping, tripping and stumbling without subsequent striking against object, initial encounter: Secondary | ICD-10-CM | POA: Diagnosis present

## 2015-04-19 DIAGNOSIS — T148 Other injury of unspecified body region: Secondary | ICD-10-CM | POA: Diagnosis not present

## 2015-04-19 DIAGNOSIS — K219 Gastro-esophageal reflux disease without esophagitis: Secondary | ICD-10-CM | POA: Diagnosis not present

## 2015-04-19 DIAGNOSIS — S72102A Unspecified trochanteric fracture of left femur, initial encounter for closed fracture: Secondary | ICD-10-CM | POA: Diagnosis not present

## 2015-04-19 DIAGNOSIS — Z09 Encounter for follow-up examination after completed treatment for conditions other than malignant neoplasm: Secondary | ICD-10-CM

## 2015-04-19 DIAGNOSIS — S72142A Displaced intertrochanteric fracture of left femur, initial encounter for closed fracture: Secondary | ICD-10-CM | POA: Diagnosis not present

## 2015-04-19 DIAGNOSIS — N183 Chronic kidney disease, stage 3 (moderate): Secondary | ICD-10-CM | POA: Diagnosis present

## 2015-04-19 DIAGNOSIS — H9193 Unspecified hearing loss, bilateral: Secondary | ICD-10-CM | POA: Diagnosis not present

## 2015-04-19 DIAGNOSIS — R2681 Unsteadiness on feet: Secondary | ICD-10-CM | POA: Diagnosis not present

## 2015-04-19 DIAGNOSIS — E785 Hyperlipidemia, unspecified: Secondary | ICD-10-CM | POA: Diagnosis present

## 2015-04-19 DIAGNOSIS — E860 Dehydration: Secondary | ICD-10-CM | POA: Diagnosis present

## 2015-04-19 DIAGNOSIS — W19XXXA Unspecified fall, initial encounter: Secondary | ICD-10-CM

## 2015-04-19 DIAGNOSIS — M79605 Pain in left leg: Secondary | ICD-10-CM | POA: Diagnosis present

## 2015-04-19 DIAGNOSIS — E669 Obesity, unspecified: Secondary | ICD-10-CM | POA: Diagnosis present

## 2015-04-19 DIAGNOSIS — J45909 Unspecified asthma, uncomplicated: Secondary | ICD-10-CM | POA: Diagnosis not present

## 2015-04-19 DIAGNOSIS — Z419 Encounter for procedure for purposes other than remedying health state, unspecified: Secondary | ICD-10-CM

## 2015-04-19 DIAGNOSIS — F0391 Unspecified dementia with behavioral disturbance: Secondary | ICD-10-CM | POA: Diagnosis not present

## 2015-04-19 DIAGNOSIS — R05 Cough: Secondary | ICD-10-CM | POA: Diagnosis not present

## 2015-04-19 DIAGNOSIS — J449 Chronic obstructive pulmonary disease, unspecified: Secondary | ICD-10-CM | POA: Diagnosis not present

## 2015-04-19 DIAGNOSIS — R509 Fever, unspecified: Secondary | ICD-10-CM

## 2015-04-19 DIAGNOSIS — F0390 Unspecified dementia without behavioral disturbance: Secondary | ICD-10-CM | POA: Diagnosis present

## 2015-04-19 DIAGNOSIS — I129 Hypertensive chronic kidney disease with stage 1 through stage 4 chronic kidney disease, or unspecified chronic kidney disease: Secondary | ICD-10-CM | POA: Diagnosis present

## 2015-04-19 DIAGNOSIS — E039 Hypothyroidism, unspecified: Secondary | ICD-10-CM

## 2015-04-19 DIAGNOSIS — S299XXA Unspecified injury of thorax, initial encounter: Secondary | ICD-10-CM | POA: Diagnosis not present

## 2015-04-19 DIAGNOSIS — Z6831 Body mass index (BMI) 31.0-31.9, adult: Secondary | ICD-10-CM

## 2015-04-19 DIAGNOSIS — S72002A Fracture of unspecified part of neck of left femur, initial encounter for closed fracture: Secondary | ICD-10-CM | POA: Diagnosis not present

## 2015-04-19 DIAGNOSIS — Z66 Do not resuscitate: Secondary | ICD-10-CM | POA: Diagnosis not present

## 2015-04-19 DIAGNOSIS — M25552 Pain in left hip: Secondary | ICD-10-CM | POA: Diagnosis not present

## 2015-04-19 DIAGNOSIS — R7611 Nonspecific reaction to tuberculin skin test without active tuberculosis: Secondary | ICD-10-CM | POA: Diagnosis present

## 2015-04-19 DIAGNOSIS — R278 Other lack of coordination: Secondary | ICD-10-CM | POA: Diagnosis not present

## 2015-04-19 DIAGNOSIS — I10 Essential (primary) hypertension: Secondary | ICD-10-CM | POA: Diagnosis present

## 2015-04-19 DIAGNOSIS — S7292XA Unspecified fracture of left femur, initial encounter for closed fracture: Secondary | ICD-10-CM | POA: Diagnosis not present

## 2015-04-19 DIAGNOSIS — J45901 Unspecified asthma with (acute) exacerbation: Secondary | ICD-10-CM | POA: Diagnosis not present

## 2015-04-19 DIAGNOSIS — D62 Acute posthemorrhagic anemia: Secondary | ICD-10-CM | POA: Diagnosis not present

## 2015-04-19 DIAGNOSIS — Z9289 Personal history of other medical treatment: Secondary | ICD-10-CM | POA: Diagnosis present

## 2015-04-19 DIAGNOSIS — T148XXA Other injury of unspecified body region, initial encounter: Secondary | ICD-10-CM

## 2015-04-19 DIAGNOSIS — Z5181 Encounter for therapeutic drug level monitoring: Secondary | ICD-10-CM | POA: Diagnosis not present

## 2015-04-19 DIAGNOSIS — R259 Unspecified abnormal involuntary movements: Secondary | ICD-10-CM | POA: Diagnosis not present

## 2015-04-19 DIAGNOSIS — N179 Acute kidney failure, unspecified: Secondary | ICD-10-CM | POA: Diagnosis not present

## 2015-04-19 DIAGNOSIS — N189 Chronic kidney disease, unspecified: Secondary | ICD-10-CM | POA: Diagnosis not present

## 2015-04-19 DIAGNOSIS — Z974 Presence of external hearing-aid: Secondary | ICD-10-CM

## 2015-04-19 LAB — URINALYSIS, ROUTINE W REFLEX MICROSCOPIC
Glucose, UA: NEGATIVE mg/dL
Hgb urine dipstick: NEGATIVE
Ketones, ur: NEGATIVE mg/dL
Leukocytes, UA: NEGATIVE
Nitrite: NEGATIVE
Protein, ur: NEGATIVE mg/dL
Specific Gravity, Urine: 1.025 (ref 1.005–1.030)
pH: 5.5 (ref 5.0–8.0)

## 2015-04-19 LAB — BASIC METABOLIC PANEL
ANION GAP: 10 (ref 5–15)
BUN: 33 mg/dL — AB (ref 6–20)
CALCIUM: 8.7 mg/dL — AB (ref 8.9–10.3)
CO2: 25 mmol/L (ref 22–32)
Chloride: 107 mmol/L (ref 101–111)
Creatinine, Ser: 1.83 mg/dL — ABNORMAL HIGH (ref 0.44–1.00)
GFR calc Af Amer: 28 mL/min — ABNORMAL LOW (ref >60)
GFR, EST NON AFRICAN AMERICAN: 25 mL/min — AB (ref >60)
GLUCOSE: 149 mg/dL — AB (ref 65–99)
POTASSIUM: 4.6 mmol/L (ref 3.5–5.1)
SODIUM: 142 mmol/L (ref 135–145)

## 2015-04-19 LAB — CBC WITH DIFFERENTIAL/PLATELET
Basophils Absolute: 0 10*3/uL (ref 0.0–0.1)
Basophils Relative: 0 %
Eosinophils Absolute: 0 10*3/uL (ref 0.0–0.7)
Eosinophils Relative: 0 %
HCT: 41.2 % (ref 36.0–46.0)
Hemoglobin: 13.1 g/dL (ref 12.0–15.0)
Lymphocytes Relative: 7 %
Lymphs Abs: 0.9 10*3/uL (ref 0.7–4.0)
MCH: 29 pg (ref 26.0–34.0)
MCHC: 31.8 g/dL (ref 30.0–36.0)
MCV: 91.2 fL (ref 78.0–100.0)
Monocytes Absolute: 0.9 10*3/uL (ref 0.1–1.0)
Monocytes Relative: 7 %
Neutro Abs: 11.8 10*3/uL — ABNORMAL HIGH (ref 1.7–7.7)
Neutrophils Relative %: 86 %
Platelets: 170 10*3/uL (ref 150–400)
RBC: 4.52 MIL/uL (ref 3.87–5.11)
RDW: 13.9 % (ref 11.5–15.5)
WBC: 13.7 10*3/uL — ABNORMAL HIGH (ref 4.0–10.5)

## 2015-04-19 LAB — PROTIME-INR
INR: 1.07 (ref 0.00–1.49)
PROTHROMBIN TIME: 13.7 s (ref 11.6–15.2)

## 2015-04-19 LAB — TSH: TSH: 2.32 u[IU]/mL (ref 0.350–4.500)

## 2015-04-19 LAB — MAGNESIUM: Magnesium: 2 mg/dL (ref 1.7–2.4)

## 2015-04-19 LAB — ABO/RH: ABO/RH(D): A POS

## 2015-04-19 MED ORDER — DOCUSATE SODIUM 100 MG PO CAPS
100.0000 mg | ORAL_CAPSULE | Freq: Two times a day (BID) | ORAL | Status: DC
  Administered 2015-04-19 – 2015-04-20 (×2): 100 mg via ORAL
  Filled 2015-04-19 (×2): qty 1

## 2015-04-19 MED ORDER — HEPARIN SODIUM (PORCINE) 5000 UNIT/ML IJ SOLN
5000.0000 [IU] | Freq: Three times a day (TID) | INTRAMUSCULAR | Status: DC
  Administered 2015-04-19: 5000 [IU] via SUBCUTANEOUS
  Filled 2015-04-19 (×3): qty 1

## 2015-04-19 MED ORDER — OXYCODONE HCL 5 MG PO TABS
5.0000 mg | ORAL_TABLET | ORAL | Status: DC | PRN
  Administered 2015-04-19 – 2015-04-20 (×2): 5 mg via ORAL
  Filled 2015-04-19 (×2): qty 1

## 2015-04-19 MED ORDER — DONEPEZIL HCL 5 MG PO TABS
10.0000 mg | ORAL_TABLET | Freq: Every day | ORAL | Status: DC
  Administered 2015-04-19 – 2015-04-22 (×4): 10 mg via ORAL
  Filled 2015-04-19 (×4): qty 2

## 2015-04-19 MED ORDER — ENOXAPARIN SODIUM 30 MG/0.3ML ~~LOC~~ SOLN
30.0000 mg | Freq: Two times a day (BID) | SUBCUTANEOUS | Status: AC
Start: 2015-04-20 — End: 2015-04-20
  Administered 2015-04-20: 30 mg via SUBCUTANEOUS
  Filled 2015-04-19: qty 0.3

## 2015-04-19 MED ORDER — ACETAMINOPHEN 325 MG PO TABS: 650.0000 mg | ORAL_TABLET | Freq: Four times a day (QID) | ORAL | Status: DC | PRN

## 2015-04-19 MED ORDER — SODIUM CHLORIDE 0.9 % IV SOLN
INTRAVENOUS | Status: DC
Start: 2015-04-19 — End: 2015-04-23
  Administered 2015-04-19 – 2015-04-23 (×8): via INTRAVENOUS

## 2015-04-19 MED ORDER — ACETAMINOPHEN 650 MG RE SUPP: 650.0000 mg | Freq: Four times a day (QID) | RECTAL | Status: DC | PRN

## 2015-04-19 MED ORDER — HYDROMORPHONE HCL 1 MG/ML IJ SOLN
0.5000 mg | INTRAMUSCULAR | Status: DC | PRN
  Administered 2015-04-19: 0.5 mg via INTRAVENOUS
  Filled 2015-04-19: qty 1

## 2015-04-19 MED ORDER — SODIUM CHLORIDE 0.9% FLUSH
3.0000 mL | Freq: Two times a day (BID) | INTRAVENOUS | Status: DC
  Administered 2015-04-20 – 2015-04-23 (×4): 3 mL via INTRAVENOUS

## 2015-04-19 MED ORDER — LEVALBUTEROL HCL 0.63 MG/3ML IN NEBU: 0.6300 mg | INHALATION_SOLUTION | Freq: Four times a day (QID) | RESPIRATORY_TRACT | Status: DC | PRN

## 2015-04-19 MED ORDER — CEFAZOLIN SODIUM-DEXTROSE 2-3 GM-% IV SOLR
2.0000 g | INTRAVENOUS | Status: AC
  Administered 2015-04-20: 2 g via INTRAVENOUS

## 2015-04-19 MED ORDER — SIMVASTATIN 40 MG PO TABS
40.0000 mg | ORAL_TABLET | Freq: Every day | ORAL | Status: DC
  Administered 2015-04-19 – 2015-04-22 (×4): 40 mg via ORAL
  Filled 2015-04-19 (×4): qty 1

## 2015-04-19 MED ORDER — ASPIRIN EC 81 MG PO TBEC
81.0000 mg | DELAYED_RELEASE_TABLET | Freq: Every day | ORAL | Status: DC
  Administered 2015-04-20: 81 mg via ORAL
  Filled 2015-04-19: qty 1

## 2015-04-19 MED ORDER — SENNA 8.6 MG PO TABS
1.0000 | ORAL_TABLET | Freq: Two times a day (BID) | ORAL | Status: DC
  Administered 2015-04-19 – 2015-04-20 (×2): 8.6 mg via ORAL
  Filled 2015-04-19 (×2): qty 1

## 2015-04-19 MED ORDER — GUAIFENESIN ER 600 MG PO TB12: 600.0000 mg | ORAL_TABLET | Freq: Two times a day (BID) | ORAL | Status: DC | PRN

## 2015-04-19 NOTE — ED Provider Notes (Addendum)
CSN: [id number]     Arrival date & time 04/19/15  1339 History   First MD Initiated Contact with Patient 04/19/15 1348     Chief Complaint  Patient presents with  . Hip Pain     (Consider location/radiation/quality/duration/timing/severity/associated sxs/prior Treatment) Patient is a 83 y.o. female presenting with hip pain. The history is provided by the patient.  Hip Pain This is a new problem. The current episode started 2 days ago. The problem occurs constantly. The problem has not changed since onset.Pertinent negatives include no chest pain, no headaches and no shortness of breath. Nothing aggravates the symptoms. Nothing relieves the symptoms. She has tried nothing for the symptoms. The treatment provided no relief.    82 yo F with a chief complaint of left hip pain. Patient fell today when she was mopping the floor and she slipped and landed on her left hip. Was unable to get up and walk on it due to the pain. EMS noted some shortening and rotation. Is no prior orthopedic surgery. Last oral intake was 9 this morning. Patient denies other injury.  Past Medical History  Diagnosis Date  . Allergy   . Hypertension   . Hyperlipidemia   . GERD (gastroesophageal reflux disease)   . Asthma   . Hx of varicella   . History of positive PPD     gets c xray screen  . Dementia    History reviewed. No pertinent past surgical history. Family History  Problem Relation Age of Onset  . Hypertension      fhx  . Diabetes Mother   . Stroke Mother   . Diabetes Father   . Edema Father     legs   Social History  Substance Use Topics  . Smoking status: Never Smoker   . Smokeless tobacco: Never Used  . Alcohol Use: No   OB History    No data available     Review of Systems  Constitutional: Negative for fever and chills.  HENT: Negative for congestion and rhinorrhea.   Eyes: Negative for redness and visual disturbance.  Respiratory: Negative for shortness of breath and wheezing.    Cardiovascular: Negative for chest pain and palpitations.  Gastrointestinal: Negative for nausea and vomiting.  Genitourinary: Negative for dysuria and urgency.  Musculoskeletal: Positive for myalgias and arthralgias.  Skin: Negative for pallor and wound.  Neurological: Negative for dizziness and headaches.      Allergies  Review of patient's allergies indicates no known allergies.  Home Medications   Prior to Admission medications   Medication Sig Start Date End Date Taking? Authorizing Provider  aspirin 81 MG EC tablet Take 81 mg by mouth daily.     Yes Historical Provider, MD  Calcium Carb-Cholecalciferol 600-800 MG-UNIT TABS Take 1 tablet by mouth daily.   Yes Historical Provider, MD  donepezil (ARICEPT) 10 MG tablet TAKE 1 TABLET BY MOUTH AT BEDTIME AS NEEDED Patient taking differently: TAKE 1 TABLET BY MOUTH AT BEDTIME 08/06/14  Yes Burnis Medin, MD  guaiFENesin (MUCINEX) 600 MG 12 hr tablet Take 600 mg by mouth 2 (two) times daily as needed for cough or to loosen phlegm.    Yes Historical Provider, MD  hydrochlorothiazide (MICROZIDE) 12.5 MG capsule TAKE 1 CAPSULE (12.5 MG TOTAL) BY MOUTH DAILY. 11/03/14  Yes Burnis Medin, MD  Multiple Vitamins-Minerals (CENTRUM SILVER PO) Take 1 tablet by mouth daily.    Yes Historical Provider, MD  simvastatin (ZOCOR) 40 MG tablet TAKE 1 TABLET BY  MOUTH AT BEDTIME 03/03/15  Yes Burnis Medin, MD   BP 112/72 mmHg  Pulse 67  Temp(Src) 98 F (36.7 C) (Oral)  Resp 14  Wt 169 lb (76.658 kg)  SpO2 98% Physical Exam  Constitutional: She is oriented to person, place, and time. She appears well-developed and well-nourished. No distress.  HENT:  Head: Normocephalic and atraumatic.  Eyes: EOM are normal. Pupils are equal, round, and reactive to light.  Neck: Normal range of motion. Neck supple.  Cardiovascular: Normal rate and regular rhythm.  Exam reveals no gallop and no friction rub.   No murmur heard. Pulmonary/Chest: Effort normal.  She has no wheezes. She has no rales.  Abdominal: Soft. She exhibits no distension. There is no tenderness. There is no rebound and no guarding.  Musculoskeletal: She exhibits no edema or tenderness.  Shortening and external rotation of the left lower extremity. Ulcer and sensation is intact distally. Pain is worse about the femur to the groin. Pain with internal and external rotation. No other signs of extremity trauma.  Neurological: She is alert and oriented to person, place, and time.  Skin: Skin is warm and dry. She is not diaphoretic.  Psychiatric: She has a normal mood and affect. Her behavior is normal.  Nursing note and vitals reviewed.   ED Course  Procedures (including critical care time) Labs Review Labs Reviewed  BASIC METABOLIC PANEL - Abnormal; Notable for the following:    Glucose, Bld 149 (*)    BUN 33 (*)    Creatinine, Ser 1.83 (*)    Calcium 8.7 (*)    GFR calc non Af Amer 25 (*)    GFR calc Af Amer 28 (*)    All other components within normal limits  CBC WITH DIFFERENTIAL/PLATELET - Abnormal; Notable for the following:    WBC 13.7 (*)    Neutro Abs 11.8 (*)    All other components within normal limits  PROTIME-INR  URINALYSIS, ROUTINE W REFLEX MICROSCOPIC (NOT AT [facility])  TYPE AND SCREEN  ABO/RH    Imaging Review Dg Chest 1 View  04/19/2015  CLINICAL DATA:  [place] this morning. Left hip pain. History of hypertension. EXAM: CHEST 1 VIEW COMPARISON:  09/24/2014 FINDINGS: Cardiac silhouette is mildly enlarged. There are no mediastinal or hilar masses or evidence of adenopathy. There is some central vascular prominence accentuated by low lung volumes. Linear scarring is noted in the right mid lung. No convincing pneumonia or pulmonary edema. No pleural effusion or pneumothorax. Bony thorax is demineralized but grossly intact. IMPRESSION: No acute cardiopulmonary disease. Electronically Signed   By: Lajean Manes M.D.   On: 04/19/2015 15:12   Dg Hip Unilat With Pelvis  2-3 Views Left  04/19/2015  CLINICAL DATA:  Status post fall this morning with a left hip injury. Continued pain. Initial encounter. EXAM: DG HIP (WITH OR WITHOUT PELVIS) 2-3V LEFT COMPARISON:  None. FINDINGS: The patient has an acute left intertrochanteric fracture. The femoral head is located. No other acute bony or joint abnormality is seen. Lower lumbar spondylosis noted. IMPRESSION: Acute left intertrochanteric fracture. Electronically Signed   By: Inge Rise M.D.   On: 04/19/2015 15:11   Dg Femur 1v Left  04/19/2015  CLINICAL DATA:  [place] earlier this morning, LEFT hip pain, history hypertension, asthma, GERD, dementia EXAM: LEFT FEMUR 1 VIEW COMPARISON:  None FINDINGS: Osseous demineralization. Hip and knee joint alignments grossly normal for positioning. Displaced and comminuted intertrochanteric fracture proximal LEFT femur. No dislocation. Visualized LEFT pelvis  intact. Mild atherosclerotic calcifications at the mid LEFT superficial femoral artery. IMPRESSION: Osseous demineralization with a comminuted displaced intertrochanteric fracture LEFT femur. Electronically Signed   By: Lavonia Dana M.D.   On: 04/19/2015 15:11   I have personally reviewed and evaluated these images and lab results as part of my medical decision-making.   EKG Interpretation   Date/Time:  Tuesday April 19 2015 15:55:31 EST Ventricular Rate:  60 PR Interval:  210 QRS Duration: 135 QT Interval:  447 QTC Calculation: 447 R Axis:   75 Text Interpretation:  Sinus rhythm Right bundle branch block No  significant change since last tracing Confirmed by Phylicia Mcgaugh MD, Quillian Quince  [id number]) on 04/19/2015 3:58:25 PM      MDM   Final diagnoses:  Closed left hip fracture, initial encounter [facility])    82 yo F with chief complaint of left hip pain. Patient with pain with internal and external rotation in obvious deformity. Found to have a intertrochanteric fracture. This was discussed with Dr. Rolena Infante, orthopedics. They will  evaluate in the hospital and determine when she will go to the OR. Will admit to medicine.   The patients results and plan were reviewed and discussed.   Any x-rays performed were independently reviewed by myself.   Differential diagnosis were considered with the presenting HPI.  Medications  0.9 %  sodium chloride infusion ( Intravenous New Bag/Given 04/19/15 1500)    Filed Vitals:   04/19/15 1347 04/19/15 1504  BP: 128/54 112/72  Pulse: 63 67  Temp: 97.6 F (36.4 C) 98 F (36.7 C)  TempSrc: Oral   Resp: 16 14  Weight: 169 lb (76.658 kg)   SpO2: 98% 98%    Final diagnoses:  Closed left hip fracture, initial encounter [facility])    Admission/ observation were discussed with the admitting physician, patient and/or family and they are comfortable with the plan.      Deno Etienne, DO 04/19/15 [place], DO 04/19/15 1558

## 2015-04-19 NOTE — ED Notes (Signed)
Pt slipped and fell while mopping the floor.  Pain in left hip.  Shortening of left leg.  Pt has IV in left wrist 22g and had 4mg  morphine and 4mg  zofran from ems.

## 2015-04-19 NOTE — ED Notes (Signed)
Bed: [id number] Expected date:  Expected time:  Means of arrival:  Comments: EMS/36M/hypertensive

## 2015-04-19 NOTE — Progress Notes (Signed)
Patient ID: Kelly Conrad, female   DOB: [birth year], 82 y.o.   MRN: [id number]    Patient ID: Kelly Conrad MRN: [id number] DOB/AGE: October 18, 1931 82 y.o.  Admit date: 04/19/2015  Admission Diagnoses:  Principal Problem:   Intertrochanteric fracture of left femur (HCC) Active Problems:   Essential hypertension   Dementia   History of positive PPD   Hearing aid worn   Obesity   Chronic asthma without complication   Hypothyroidism (acquired)   Fracture, intertrochanteric, left femur (HCC)   HPI: Very pleasant 82 year old female pt.  She was mopping her kitchen this afternoon and slipped and fell.  She lives with a family member.  He reports she is normally functional and ambulatory.  The family member attempted to help her up but was unable to do so. EMT was called and she was brought to the ED.    Past Medical History: Past Medical History  Diagnosis Date  . Allergy   . Hypertension   . Hyperlipidemia   . GERD (gastroesophageal reflux disease)   . Asthma   . Hx of varicella   . History of positive PPD     gets c xray screen  . Dementia     Surgical History: History reviewed. No pertinent past surgical history.  Family History: Family History  Problem Relation Age of Onset  . Hypertension      fhx  . Diabetes Mother   . Stroke Mother   . Diabetes Father   . Edema Father     legs    Social History: Social History   Social History  . Marital Status: Single    Spouse Name: N/A  . Number of Children: N/A  . Years of Education: N/A   Occupational History  . Not on file.   Social History Main Topics  . Smoking status: Never Smoker   . Smokeless tobacco: Never Used  . Alcohol Use: No  . Drug Use: No  . Sexual Activity: No   Other Topics Concern  . 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 [name]    He is the main caretaker. Reannon took care of his mom  Who has since  passed away.   He has some vision problems and partial amputation but  No mobiilty problems       Neg ets FA safely stored smoke alarm   No tob or etoh.    Allergies: Review of patient's allergies indicates no known allergies.  Medications: I have reviewed the patient's current medications.  Vital Signs: Patient Vitals for the past 24 hrs:  BP Temp Temp src Pulse Resp SpO2 Weight  04/19/15 1504 112/72 mmHg 98 F (36.7 C) - 67 14 98 % -  04/19/15 1347 (!) 128/54 mmHg 97.6 F (36.4 C) Oral 63 16 98 % 76.658 kg (169 lb)    Radiology: Dg Chest 1 View  04/19/2015  CLINICAL DATA:  [place] this morning. Left hip pain. History of hypertension. EXAM: CHEST 1 VIEW COMPARISON:  09/24/2014 FINDINGS: Cardiac silhouette is mildly enlarged. There are no mediastinal or hilar masses or evidence of adenopathy. There is some central vascular prominence accentuated by low lung volumes. Linear scarring is noted in the right mid lung. No convincing pneumonia or pulmonary edema. No pleural effusion or pneumothorax. Bony thorax is demineralized but grossly intact. IMPRESSION: No acute cardiopulmonary disease. Electronically Signed  By: Lajean Manes M.D.   On: 04/19/2015 15:12   Dg Hip Unilat With Pelvis 2-3 Views Left  04/19/2015  CLINICAL DATA:  Status post fall this morning with a left hip injury. Continued pain. Initial encounter. EXAM: DG HIP (WITH OR WITHOUT PELVIS) 2-3V LEFT COMPARISON:  None. FINDINGS: The patient has an acute left intertrochanteric fracture. The femoral head is located. No other acute bony or joint abnormality is seen. Lower lumbar spondylosis noted. IMPRESSION: Acute left intertrochanteric fracture. Electronically Signed   By: Inge Rise M.D.   On: 04/19/2015 15:11   Dg Femur 1v Left  04/19/2015  CLINICAL DATA:  [place] earlier this morning, LEFT hip pain, history hypertension, asthma, GERD, dementia EXAM: LEFT FEMUR 1 VIEW COMPARISON:  None FINDINGS: Osseous demineralization. Hip and  knee joint alignments grossly normal for positioning. Displaced and comminuted intertrochanteric fracture proximal LEFT femur. No dislocation. Visualized LEFT pelvis intact. Mild atherosclerotic calcifications at the mid LEFT superficial femoral artery. IMPRESSION: Osseous demineralization with a comminuted displaced intertrochanteric fracture LEFT femur. Electronically Signed   By: Lavonia Dana M.D.   On: 04/19/2015 15:11    Labs:  Recent Labs  04/19/15 1415  WBC 13.7*  RBC 4.52  HCT 41.2  PLT 170    Recent Labs  04/19/15 1415  NA 142  K 4.6  CL 107  CO2 25  BUN 33*  CREATININE 1.83*  GLUCOSE 149*  CALCIUM 8.7*    Recent Labs  04/19/15 1415  INR 1.07    Review of Systems: ROS  Physical Exam: Neurologically intact ABD soft Sensation intact distally Compartment soft  Exquisite pain of the left anterior hip Left leg externally rotated  Assessment and Plan: NPO after midnight Dr. Lyla Glassing will surgically repair tomorrow   Ronette Deter, PA-C [facility] [phone]

## 2015-04-19 NOTE — H&P (Addendum)
[facility] History and Physical  Kelly Conrad [id number] DOB: 03/01/1932 DOA: 04/19/2015  Referring physician:  PCP: Lottie Dawson, MD   Chief Complaint: fall  HPI:   82 year old female with a past medical history of hypertension, dyslipidemia, gastroesophageal reflux disease, asthma, dementia without behavioral disturbance, who presents to the ER after a fall. Patient lives with her friend called [name] who has taken care of her for 22 years. He provides most of the history. He states that the patient is fairly independent with her ambulation and does not use a walker or a cane. For the last 2 weeks she has had some shortness of breath with exertion, but no chest pain. He attributes it to sinus infection and chest congestion and has been treating her with Mucinex. Her chest x-ray is negative. Apparently the patient slipped and fell while she was trying to mop the floor and the floor happened to be wet. She was unable to get up to walk due to pain. She was brought in by EMS and was noted to have shortening and rotation of the left leg.      Review of Systems: negative for the following  Constitutional: Denies fever, chills, diaphoresis, appetite change and fatigue.  HEENT: Denies photophobia, eye pain, redness, hearing loss, ear pain, positive for nasal congestion, sore throat, rhinorrhea, sneezing, mouth sores, trouble swallowing, neck pain, neck stiffness and tinnitus.  Respiratory: Denies SOB, DOE, cough, chest tightness, and wheezing.  Cardiovascular: Denies chest pain, palpitations and leg swelling.  Gastrointestinal: Denies nausea, vomiting, abdominal pain, diarrhea, constipation, blood in stool and abdominal distention.  Genitourinary: Denies dysuria, urgency, frequency, hematuria, flank pain and difficulty urinating.  Musculoskeletal: Denies myalgias, back pain, joint swelling, arthralgias and gait problem.  Skin: Denies pallor, rash and wound.   Neurological: Denies dizziness, seizures, syncope, weakness, light-headedness, numbness and headaches.  Hematological: Denies adenopathy. Easy bruising, personal or family bleeding history  Psychiatric/Behavioral: Denies suicidal ideation, mood changes, confusion, nervousness, sleep disturbance and agitation       Past Medical History  Diagnosis Date  . Allergy   . Hypertension   . Hyperlipidemia   . GERD (gastroesophageal reflux disease)   . Asthma   . Hx of varicella   . History of positive PPD     gets c xray screen  . Dementia      History reviewed. No pertinent past surgical history.    Social History:  reports that she has never smoked. She has never used smokeless tobacco. She reports that she does not drink alcohol or use illicit drugs.    No Known Allergies  Family History  Problem Relation Age of Onset  . Hypertension      fhx  . Diabetes Mother   . Stroke Mother   . Diabetes Father   . Edema Father     legs      Prior to Admission medications   Medication Sig Start Date End Date Taking? Authorizing Provider  aspirin 81 MG EC tablet Take 81 mg by mouth daily.     Yes Historical Provider, MD  Calcium Carb-Cholecalciferol 600-800 MG-UNIT TABS Take 1 tablet by mouth daily.   Yes Historical Provider, MD  donepezil (ARICEPT) 10 MG tablet TAKE 1 TABLET BY MOUTH AT BEDTIME AS NEEDED Patient taking differently: TAKE 1 TABLET BY MOUTH AT BEDTIME 08/06/14  Yes Burnis Medin, MD  guaiFENesin (MUCINEX) 600 MG 12 hr tablet Take 600 mg by mouth 2 (two) times daily as needed for  cough or to loosen phlegm.    Yes Historical Provider, MD  hydrochlorothiazide (MICROZIDE) 12.5 MG capsule TAKE 1 CAPSULE (12.5 MG TOTAL) BY MOUTH DAILY. 11/03/14  Yes Burnis Medin, MD  Multiple Vitamins-Minerals (CENTRUM SILVER PO) Take 1 tablet by mouth daily.    Yes Historical Provider, MD  simvastatin (ZOCOR) 40 MG tablet TAKE 1 TABLET BY MOUTH AT BEDTIME 03/03/15  Yes Burnis Medin, MD      Physical Exam: Filed Vitals:   04/19/15 1347 04/19/15 1504  BP: 128/54 112/72  Pulse: 63 67  Temp: 97.6 F (36.4 C) 98 F (36.7 C)  TempSrc: Oral   Resp: 16 14  Weight: 76.658 kg (169 lb)   SpO2: 98% 98%     Constitutional: Vital signs reviewed. Patient is a well-developed and well-nourished in no acute distress and cooperative with exam. Awake but nonverbal, extremely hard of hearing Head: Normocephalic and atraumatic  Ear: TM normal bilaterally  Mouth: no erythema or exudates, MMM  Eyes: PERRL, EOMI, conjunctivae normal, No scleral icterus.  Neck: Supple, Trachea midline normal ROM, No JVD, mass, thyromegaly, or carotid bruit present.  Cardiovascular: RRR, S1 normal, S2 normal, no MRG, pulses symmetric and intact bilaterally  Pulmonary/Chest: CTAB, no wheezes, rales, or rhonchi  Abdominal: Soft. Non-tender, non-distended, bowel sounds are normal, no masses, organomegaly, or guarding present.  GU: no CVA tenderness Musculoskeletal: She exhibits no edema or tenderness.  Shortening and external rotation of the left lower extremity.  Ext: no edema and no cyanosis, pulses palpable bilaterally (DP and PT)  Hematology: no cervical, inginal, or axillary adenopathy.  Neurological: A&O x3, Strenght is normal and symmetric bilaterally, cranial nerve II-XII are grossly intact, no focal motor deficit, sensory intact to light touch bilaterally.  Skin: Warm, dry and intact. No rash, cyanosis, or clubbing.  Psychiatric: Normal mood and affect. speech and behavior is normal. Patient very hard of hearing Cognition and memory unknown     Data Review   Micro Results No results found for this or any previous visit (from the past 240 hour(s)).  Radiology Reports Dg Chest 1 View  04/19/2015  CLINICAL DATA:  [place] this morning. Left hip pain. History of hypertension. EXAM: CHEST 1 VIEW COMPARISON:  09/24/2014 FINDINGS: Cardiac silhouette is mildly enlarged. There are no mediastinal or hilar  masses or evidence of adenopathy. There is some central vascular prominence accentuated by low lung volumes. Linear scarring is noted in the right mid lung. No convincing pneumonia or pulmonary edema. No pleural effusion or pneumothorax. Bony thorax is demineralized but grossly intact. IMPRESSION: No acute cardiopulmonary disease. Electronically Signed   By: Lajean Manes M.D.   On: 04/19/2015 15:12   Dg Hip Unilat With Pelvis 2-3 Views Left  04/19/2015  CLINICAL DATA:  Status post fall this morning with a left hip injury. Continued pain. Initial encounter. EXAM: DG HIP (WITH OR WITHOUT PELVIS) 2-3V LEFT COMPARISON:  None. FINDINGS: The patient has an acute left intertrochanteric fracture. The femoral head is located. No other acute bony or joint abnormality is seen. Lower lumbar spondylosis noted. IMPRESSION: Acute left intertrochanteric fracture. Electronically Signed   By: Inge Rise M.D.   On: 04/19/2015 15:11   Dg Femur 1v Left  04/19/2015  CLINICAL DATA:  [place] earlier this morning, LEFT hip pain, history hypertension, asthma, GERD, dementia EXAM: LEFT FEMUR 1 VIEW COMPARISON:  None FINDINGS: Osseous demineralization. Hip and knee joint alignments grossly normal for positioning. Displaced and comminuted intertrochanteric fracture proximal LEFT femur. No  dislocation. Visualized LEFT pelvis intact. Mild atherosclerotic calcifications at the mid LEFT superficial femoral artery. IMPRESSION: Osseous demineralization with a comminuted displaced intertrochanteric fracture LEFT femur. Electronically Signed   By: Lavonia Dana M.D.   On: 04/19/2015 15:11     CBC  Recent Labs Lab 04/19/15 1415  WBC 13.7*  HGB 13.1  HCT 41.2  PLT 170  MCV 91.2  MCH 29.0  MCHC 31.8  RDW 13.9  LYMPHSABS 0.9  MONOABS 0.9  EOSABS 0.0  BASOSABS 0.0    Chemistries   Recent Labs Lab 04/19/15 1415  NA 142  K 4.6  CL 107  CO2 25  GLUCOSE 149*  BUN 33*  CREATININE 1.83*  CALCIUM 8.7*    ------------------------------------------------------------------------------------------------------------------ estimated creatinine clearance is 22.2 mL/min (by C-G formula based on Cr of 1.83). ------------------------------------------------------------------------------------------------------------------ No results for input(s): HGBA1C in the last 72 hours. ------------------------------------------------------------------------------------------------------------------ No results for input(s): CHOL, HDL, LDLCALC, TRIG, CHOLHDL, LDLDIRECT in the last 72 hours. ------------------------------------------------------------------------------------------------------------------ No results for input(s): TSH, T4TOTAL, T3FREE, THYROIDAB in the last 72 hours.  Invalid input(s): FREET3 ------------------------------------------------------------------------------------------------------------------ No results for input(s): VITAMINB12, FOLATE, FERRITIN, TIBC, IRON, RETICCTPCT in the last 72 hours.  Coagulation profile  Recent Labs Lab 04/19/15 1415  INR 1.07    No results for input(s): DDIMER in the last 72 hours.  Cardiac Enzymes No results for input(s): CKMB, TROPONINI, MYOGLOBIN in the last 168 hours.  Invalid input(s): CK ------------------------------------------------------------------------------------------------------------------ Invalid input(s): POCBNP   CBG: No results for input(s): GLUCAP in the last 168 hours.     EKG: Independently reviewed.  EKG Interpretation   Date/Time: Tuesday April 19 2015 15:55:31 EST Ventricular Rate: 60 PR Interval: 210 QRS Duration: 135 QT Interval: 447 QTC Calculation: 447 R Axis: 75 Text Interpretation: Sinus rhythm Right bundle branch block No  significant change since last tracing  Assessment/Plan Principal Problem:   Intertrochanteric fracture of left femur (HCC) According to Dr. Tyrone Nine, Dr. Rolena Infante has  been notified from orthopedics According to the revised [facility] cardiac risk index, patient is at low to moderate risk from a cardiac standpoint POA agreeable to proceed with surgery Patient anticipated to be seen by Dr. Rolena Infante, therefore will keep patient NPO     Essential hypertension-controlled, will start patient on pr hydralazine   dementia-continue Aricept    History of positive PPD-This may be repeated if SNF placement Is needed  Hard of hearing-patient has hearing aids    Obesity- Body mass index is 31.95 kg/(m^2).    Chronic asthma without complication-chest x-ray negative, will start patient on when necessary Xopenex treatments    Hypothyroidism (acquired)-check TSH and continue Synthroid         Code Status Orders    DNR     Start     Ordered     Family Communication: bedside Disposition Plan: admit   Total time spent 55 minutes.Greater than 50% of this time was spent in counseling, explanation of diagnosis, planning of further management, and coordination of care  [facility] Pager [phone]  If 7PM-7AM, please contact night-coverage [email or web address] Password [facility] 04/19/2015, 3:55 PM

## 2015-04-19 NOTE — ED Notes (Signed)
Patient transported to X-ray 

## 2015-04-20 ENCOUNTER — Inpatient Hospital Stay (HOSPITAL_COMMUNITY): Payer: Medicare Other

## 2015-04-20 ENCOUNTER — Other Ambulatory Visit: Payer: Self-pay | Admitting: Family Medicine

## 2015-04-20 ENCOUNTER — Inpatient Hospital Stay (HOSPITAL_COMMUNITY): Payer: Medicare Other | Admitting: Anesthesiology

## 2015-04-20 ENCOUNTER — Encounter (HOSPITAL_COMMUNITY)
Admission: EM | Disposition: A | Discharge status: Skilled Nursing Facility | Payer: Self-pay | Source: Home / Self Care | Attending: Internal Medicine

## 2015-04-20 ENCOUNTER — Encounter (HOSPITAL_COMMUNITY): Payer: Self-pay | Admitting: Anesthesiology

## 2015-04-20 DIAGNOSIS — S72102A Unspecified trochanteric fracture of left femur, initial encounter for closed fracture: Secondary | ICD-10-CM | POA: Diagnosis present

## 2015-04-20 DIAGNOSIS — F0391 Unspecified dementia with behavioral disturbance: Secondary | ICD-10-CM

## 2015-04-20 DIAGNOSIS — E039 Hypothyroidism, unspecified: Secondary | ICD-10-CM

## 2015-04-20 HISTORY — PX: FEMUR IM NAIL: SHX1597

## 2015-04-20 LAB — COMPREHENSIVE METABOLIC PANEL
ALBUMIN: 3.1 g/dL — AB (ref 3.5–5.0)
ALT: 15 U/L (ref 14–54)
ANION GAP: 9 (ref 5–15)
AST: 22 U/L (ref 15–41)
Alkaline Phosphatase: 55 U/L (ref 38–126)
BUN: 34 mg/dL — ABNORMAL HIGH (ref 6–20)
CHLORIDE: 108 mmol/L (ref 101–111)
CO2: 24 mmol/L (ref 22–32)
Calcium: 7.9 mg/dL — ABNORMAL LOW (ref 8.9–10.3)
Creatinine, Ser: 1.5 mg/dL — ABNORMAL HIGH (ref 0.44–1.00)
GFR calc non Af Amer: 31 mL/min — ABNORMAL LOW (ref >60)
GFR, EST AFRICAN AMERICAN: 36 mL/min — AB (ref >60)
GLUCOSE: 134 mg/dL — AB (ref 65–99)
Potassium: 4.2 mmol/L (ref 3.5–5.1)
SODIUM: 141 mmol/L (ref 135–145)
Total Bilirubin: 0.6 mg/dL (ref 0.3–1.2)
Total Protein: 5.2 g/dL — ABNORMAL LOW (ref 6.5–8.1)

## 2015-04-20 LAB — SURGICAL PCR SCREEN
MRSA, PCR: NEGATIVE
Staphylococcus aureus: NEGATIVE

## 2015-04-20 LAB — CBC
HEMATOCRIT: 31.5 % — AB (ref 36.0–46.0)
HEMOGLOBIN: 10.1 g/dL — AB (ref 12.0–15.0)
MCH: 29.2 pg (ref 26.0–34.0)
MCHC: 32.1 g/dL (ref 30.0–36.0)
MCV: 91 fL (ref 78.0–100.0)
Platelets: 140 10*3/uL — ABNORMAL LOW (ref 150–400)
RBC: 3.46 MIL/uL — AB (ref 3.87–5.11)
RDW: 14.2 % (ref 11.5–15.5)
WBC: 5.8 10*3/uL (ref 4.0–10.5)

## 2015-04-20 SURGERY — INSERTION, INTRAMEDULLARY ROD, FEMUR
Anesthesia: General | Site: Hip | Laterality: Left

## 2015-04-20 MED ORDER — LIDOCAINE HCL (CARDIAC) 20 MG/ML IV SOLN
INTRAVENOUS | Status: DC | PRN
  Administered 2015-04-20: 100 mg via INTRAVENOUS

## 2015-04-20 MED ORDER — ENOXAPARIN SODIUM 40 MG/0.4ML ~~LOC~~ SOLN
40.0000 mg | SUBCUTANEOUS | Status: DC
  Administered 2015-04-21 – 2015-04-23 (×3): 40 mg via SUBCUTANEOUS
  Filled 2015-04-20 (×3): qty 0.4

## 2015-04-20 MED ORDER — HYDROCODONE-ACETAMINOPHEN 5-325 MG PO TABS
1.0000 | ORAL_TABLET | Freq: Four times a day (QID) | ORAL | Status: DC | PRN
  Administered 2015-04-21: 1 via ORAL
  Filled 2015-04-20 (×2): qty 1

## 2015-04-20 MED ORDER — EPHEDRINE SULFATE 50 MG/ML IJ SOLN
INTRAMUSCULAR | Status: DC | PRN
  Administered 2015-04-20: 10 mg via INTRAVENOUS

## 2015-04-20 MED ORDER — METOCLOPRAMIDE HCL 5 MG/ML IJ SOLN: 5.0000 mg | Freq: Three times a day (TID) | INTRAMUSCULAR | Status: DC | PRN

## 2015-04-20 MED ORDER — ISOPROPYL ALCOHOL 70 % SOLN
Status: DC | PRN
  Administered 2015-04-20: 1 via TOPICAL

## 2015-04-20 MED ORDER — FENTANYL CITRATE (PF) 100 MCG/2ML IJ SOLN
INTRAMUSCULAR | Status: DC | PRN
Start: 2015-04-20 — End: 2015-04-20
  Administered 2015-04-20 (×4): 25 ug via INTRAVENOUS

## 2015-04-20 MED ORDER — PROPOFOL 10 MG/ML IV BOLUS
INTRAVENOUS | Status: AC
  Filled 2015-04-20: qty 20

## 2015-04-20 MED ORDER — HYDROCHLOROTHIAZIDE 12.5 MG PO CAPS
12.5000 mg | ORAL_CAPSULE | Freq: Every day | ORAL | Status: DC
  Administered 2015-04-20 – 2015-04-23 (×4): 12.5 mg via ORAL
  Filled 2015-04-20 (×4): qty 1

## 2015-04-20 MED ORDER — PHENOL 1.4 % MT LIQD: 1.0000 | OROMUCOSAL | Status: DC | PRN

## 2015-04-20 MED ORDER — ACETAMINOPHEN 325 MG PO TABS
650.0000 mg | ORAL_TABLET | Freq: Four times a day (QID) | ORAL | Status: DC | PRN
  Administered 2015-04-21 – 2015-04-22 (×3): 650 mg via ORAL
  Filled 2015-04-20 (×3): qty 2

## 2015-04-20 MED ORDER — SUGAMMADEX SODIUM 500 MG/5ML IV SOLN
INTRAVENOUS | Status: AC
  Filled 2015-04-20: qty 5

## 2015-04-20 MED ORDER — ONDANSETRON HCL 4 MG/2ML IJ SOLN
INTRAMUSCULAR | Status: DC | PRN
  Administered 2015-04-20: 4 mg via INTRAVENOUS

## 2015-04-20 MED ORDER — ONDANSETRON HCL 4 MG/2ML IJ SOLN: 4.0000 mg | Freq: Once | INTRAMUSCULAR | Status: DC | PRN

## 2015-04-20 MED ORDER — ACETAMINOPHEN 10 MG/ML IV SOLN
INTRAVENOUS | Status: AC
  Filled 2015-04-20: qty 100

## 2015-04-20 MED ORDER — ACETAMINOPHEN 10 MG/ML IV SOLN
INTRAVENOUS | Status: DC | PRN
  Administered 2015-04-20: 1000 mg via INTRAVENOUS

## 2015-04-20 MED ORDER — HYDROCHLOROTHIAZIDE 12.5 MG PO CAPS: ORAL_CAPSULE | ORAL | Status: DC

## 2015-04-20 MED ORDER — ROCURONIUM BROMIDE 100 MG/10ML IV SOLN
INTRAVENOUS | Status: DC | PRN
  Administered 2015-04-20: 30 mg via INTRAVENOUS

## 2015-04-20 MED ORDER — SUCCINYLCHOLINE CHLORIDE 20 MG/ML IJ SOLN
INTRAMUSCULAR | Status: DC | PRN
  Administered 2015-04-20: 100 mg via INTRAVENOUS

## 2015-04-20 MED ORDER — SENNA 8.6 MG PO TABS
1.0000 | ORAL_TABLET | Freq: Two times a day (BID) | ORAL | Status: DC
  Administered 2015-04-20 – 2015-04-23 (×6): 8.6 mg via ORAL
  Filled 2015-04-20 (×6): qty 1

## 2015-04-20 MED ORDER — ACETAMINOPHEN 650 MG RE SUPP: 650.0000 mg | Freq: Four times a day (QID) | RECTAL | Status: DC | PRN

## 2015-04-20 MED ORDER — ONDANSETRON HCL 4 MG/2ML IJ SOLN: 4.0000 mg | Freq: Four times a day (QID) | INTRAMUSCULAR | Status: DC | PRN

## 2015-04-20 MED ORDER — SODIUM CHLORIDE 0.9 % IR SOLN
Status: DC | PRN
  Administered 2015-04-20: 1000 mL

## 2015-04-20 MED ORDER — ONDANSETRON HCL 4 MG PO TABS: 4.0000 mg | ORAL_TABLET | Freq: Four times a day (QID) | ORAL | Status: DC | PRN

## 2015-04-20 MED ORDER — LIDOCAINE HCL (CARDIAC) 20 MG/ML IV SOLN
INTRAVENOUS | Status: AC
  Filled 2015-04-20: qty 5

## 2015-04-20 MED ORDER — HYDROMORPHONE HCL 1 MG/ML IJ SOLN: 0.5000 mg | INTRAMUSCULAR | Status: DC | PRN

## 2015-04-20 MED ORDER — LACTATED RINGERS IV SOLN
INTRAVENOUS | Status: DC
  Administered 2015-04-20 (×2): via INTRAVENOUS
  Administered 2015-04-20: 1000 mL via INTRAVENOUS

## 2015-04-20 MED ORDER — ISOPROPYL ALCOHOL 70 % SOLN
Status: AC
  Filled 2015-04-20: qty 480

## 2015-04-20 MED ORDER — METOCLOPRAMIDE HCL 10 MG PO TABS: 5.0000 mg | ORAL_TABLET | Freq: Three times a day (TID) | ORAL | Status: DC | PRN

## 2015-04-20 MED ORDER — SUGAMMADEX SODIUM 200 MG/2ML IV SOLN
INTRAVENOUS | Status: DC | PRN
  Administered 2015-04-20: 160 mg via INTRAVENOUS

## 2015-04-20 MED ORDER — FENTANYL CITRATE (PF) 100 MCG/2ML IJ SOLN
INTRAMUSCULAR | Status: AC
  Filled 2015-04-20: qty 2

## 2015-04-20 MED ORDER — CEFAZOLIN SODIUM-DEXTROSE 2-3 GM-% IV SOLR
2.0000 g | Freq: Four times a day (QID) | INTRAVENOUS | Status: AC
  Administered 2015-04-20 – 2015-04-21 (×2): 2 g via INTRAVENOUS
  Filled 2015-04-20 (×4): qty 50

## 2015-04-20 MED ORDER — CEFAZOLIN SODIUM-DEXTROSE 2-3 GM-% IV SOLR
INTRAVENOUS | Status: AC
  Filled 2015-04-20: qty 50

## 2015-04-20 MED ORDER — MENTHOL 3 MG MT LOZG: 1.0000 | LOZENGE | OROMUCOSAL | Status: DC | PRN

## 2015-04-20 MED ORDER — PROPOFOL 10 MG/ML IV BOLUS
INTRAVENOUS | Status: DC | PRN
  Administered 2015-04-20: 100 mg via INTRAVENOUS

## 2015-04-20 MED ORDER — ONDANSETRON HCL 4 MG/2ML IJ SOLN
INTRAMUSCULAR | Status: AC
  Filled 2015-04-20: qty 2

## 2015-04-20 MED ORDER — DOCUSATE SODIUM 100 MG PO CAPS
100.0000 mg | ORAL_CAPSULE | Freq: Two times a day (BID) | ORAL | Status: DC
  Administered 2015-04-20 – 2015-04-23 (×6): 100 mg via ORAL
  Filled 2015-04-20 (×6): qty 1

## 2015-04-20 SURGICAL SUPPLY — 35 items
BAG SPEC THK2 15X12 ZIP CLS (MISCELLANEOUS)
BAG ZIPLOCK 12X15 (MISCELLANEOUS) IMPLANT
BIT DRILL 4.2 (DRILL) IMPLANT
BLADE TFNA HELICAL 100 STRL (Anchor) ×2 IMPLANT
CHLORAPREP W/TINT 26ML (MISCELLANEOUS) ×3 IMPLANT
COVER PERINEAL POST (MISCELLANEOUS) ×3 IMPLANT
DRAPE C-ARM 42X120 X-RAY (DRAPES) ×3 IMPLANT
DRAPE C-ARMOR (DRAPES) ×3 IMPLANT
DRAPE ORTHO SPLIT 77X108 STRL (DRAPES) ×3
DRAPE STERI IOBAN 125X83 (DRAPES) ×3 IMPLANT
DRAPE SURG ORHT 6 SPLT 77X108 (DRAPES) IMPLANT
DRAPE U-SHAPE 47X51 STRL (DRAPES) ×6 IMPLANT
DRILL 4.2 (DRILL) ×3
DRSG MEPILEX BORDER 4X4 (GAUZE/BANDAGES/DRESSINGS) ×9 IMPLANT
ELECT BLADE TIP CTD 4 INCH (ELECTRODE) ×3 IMPLANT
GAUZE SPONGE 4X4 12PLY STRL (GAUZE/BANDAGES/DRESSINGS) ×3 IMPLANT
GLOVE BIOGEL PI IND STRL 8.5 (GLOVE) ×1 IMPLANT
GLOVE BIOGEL PI INDICATOR 8.5 (GLOVE) ×2
GLOVE SURG ORTHO 8.5 STRL (GLOVE) ×9 IMPLANT
GOWN SPEC L3 XXLG W/TWL (GOWN DISPOSABLE) ×6 IMPLANT
GUIDEWIRE 3.2X400 (WIRE) ×4 IMPLANT
KIT BASIN OR (CUSTOM PROCEDURE TRAY) ×3 IMPLANT
LIQUID BAND (GAUZE/BANDAGES/DRESSINGS) ×6 IMPLANT
MANIFOLD NEPTUNE II (INSTRUMENTS) ×3 IMPLANT
MARKER SKIN DUAL TIP RULER LAB (MISCELLANEOUS) ×3 IMPLANT
NAIL TROCH FIX LNG 11X360LT (Nail) ×2 IMPLANT
PACK TOTAL JOINT (CUSTOM PROCEDURE TRAY) ×3 IMPLANT
REAMER ROD DEEP FLUTE 2.5X950 (INSTRUMENTS) ×2 IMPLANT
SCREW LOCKING 5.0X38MM (Screw) ×2 IMPLANT
SUT MNCRL AB 3-0 PS2 18 (SUTURE) ×6 IMPLANT
SUT VIC AB 1 CT1 27 (SUTURE) ×6
SUT VIC AB 1 CT1 27XBRD ANTBC (SUTURE) ×2 IMPLANT
SUT VIC AB 2-0 CT1 27 (SUTURE) ×6
SUT VIC AB 2-0 CT1 27XBRD (SUTURE) ×2 IMPLANT
YANKAUER SUCT BULB TIP NO VENT (SUCTIONS) ×3 IMPLANT

## 2015-04-20 NOTE — Anesthesia Procedure Notes (Signed)
Procedure Name: Intubation Date/Time: 04/20/2015 4:44 PM Performed by: Maxwell Caul Pre-anesthesia Checklist: Patient identified, Emergency Drugs available, Suction available and Patient being monitored Patient Re-evaluated:Patient Re-evaluated prior to inductionOxygen Delivery Method: Circle System Utilized Preoxygenation: Pre-oxygenation with 100% oxygen Intubation Type: IV induction Ventilation: Mask ventilation without difficulty Laryngoscope Size: Mac and 4 Grade View: Grade I Tube type: Oral Number of attempts: 1 Airway Equipment and Method: Stylet and Oral airway Placement Confirmation: ETT inserted through vocal cords under direct vision,  positive ETCO2 and breath sounds checked- equal and bilateral Secured at: 21 cm Tube secured with: Tape Dental Injury: Teeth and Oropharynx as per pre-operative assessment

## 2015-04-20 NOTE — Progress Notes (Signed)
[facility] PROGRESS NOTE  Kelly Conrad [id number] DOB: 1932-02-19 DOA: 04/19/2015 PCP: Kelly Dawson, MD  Length of stay: 1   Assessment/Plan: Principal Problem:   Intertrochanteric fracture of left femur (Kelly Conrad) Active Problems:   Essential hypertension   Dementia   History of positive PPD   Hearing aid worn   Obesity   Chronic asthma without complication   Hypothyroidism (acquired)   Fracture, intertrochanteric, left femur [facility])    Brief summary 82 year old female with a past medical history of hypertension, dyslipidemia, gastroesophageal reflux disease, asthma, dementia without behavioral disturbance, who presents to the ER after a fall. Patient lives with her friend called HCPOA/POA Kelly Conrad who has taken care of her for 22 years. He provides most of the history. He states that the patient is fairly independent with her ambulation and does not use a walker or a cane. For the last 2 weeks she has had some shortness of breath with exertion, but no chest pain. He attributes it to sinus infection and chest congestion and has been treating her with Mucinex. Her chest x-ray is negative. Apparently the patient slipped and fell while she was trying to mop the floor and the floor happened to be wet. She was unable to get up to walk due to pain. She was brought in by EMS and was noted to have shortening and rotation of the left leg.  Assessment and plan  Intertrochanteric fracture of left femur (Kelly Conrad) Patient not followed by Rod Can, MD, anticipated to have surgery this afternoon According to the revised [facility] cardiac risk index, patient is at low to moderate risk from a cardiac standpoint POA agreeable to proceed with surgery Continue NPO, can resume regular diet after surgery  Acute kidney injury in the setting of chronic kidney disease stage III-baseline creatinine 1.4, presented with a creatinine of 1.83, currently 1.5 and IV fluids, likely secondary to  dehydration, continue IV fluids   Essential hypertension-controlled, will start patient on pr hydralazine, blood pressure soft, continue to monitor  dementia-continue Aricept   History of positive PPD-This may be repeated if SNF placement Is needed  Hard of hearing-patient has hearing aids   Obesity- Body mass index is 31.95 kg/(m^2).   Chronic asthma without complication-chest x-ray negative, will start patient on when necessary Xopenex treatments   Hypothyroidism (acquired)-  TSH 2.32 and continue Synthroid   DVT prophylaxsis heparin, currently on hold  Code Status:      Code Status Orders     DO NOT RESUSCITATE    Start     Ordered    Family Communication: Discussed in detail with the patient, all imaging results, lab results explained to the patient   Disposition Plan:  Anticipated to have surgery this afternoon    Consultants:  Orthopedics  Procedures:  None  Antibiotics: Anti-infectives    Start     Dose/Rate Route Frequency Ordered Stop   04/19/15 2037  ceFAZolin (ANCEF) IVPB 2 g/50 mL premix     2 g 100 mL/hr over 30 Minutes Intravenous 30 min pre-op 04/19/15 2032           HPI/Subjective: Pleasant, hard of hearing, denies pain, stable overnight, blood pressure  Objective: Filed Vitals:   04/19/15 1504 04/19/15 1702 04/19/15 2122 04/20/15 0621  BP: 112/72 107/61 94/60 107/47  Pulse: 67 61 64 68  Temp: 98 F (36.7 C) 98.1 F (36.7 C) 98.2 F (36.8 C) 99.1 F (37.3 C)  TempSrc:  Oral  Oral Oral  Resp: 14 18 18 18   Height:  5\' 1"  (1.549 m)    Weight:  77.61 kg (171 lb 1.6 oz)    SpO2: 98% 95% 97% 98%    Intake/Output Summary (Last 24 hours) at 04/20/15 1121 Last data filed at 04/20/15 0700  Gross per 24 hour  Intake   1500 ml  Output    450 ml  Net   1050 ml    Exam:  General: No acute respiratory distress Lungs: Clear to auscultation bilaterally without wheezes or crackles Cardiovascular: Regular rate and rhythm without  murmur gallop or rub normal S1 and S2 Abdomen: Nontender, nondistended, soft, bowel sounds positive, no rebound, no ascites, no appreciable mass Extremities: External rotation of the left hip,     Data Review   Micro Results Recent Results (from the past 240 hour(s))  Surgical pcr screen     Status: None   Collection Time: 04/20/15  6:23 AM  Result Value Ref Range Status   MRSA, PCR NEGATIVE NEGATIVE Final   Staphylococcus aureus NEGATIVE NEGATIVE Final    Comment:        The Xpert SA Assay (FDA approved for NASAL specimens in patients over 66 years of age), is one component of a comprehensive surveillance program.  Test performance has been validated by [facility] for patients greater than or equal to 59 year old. It is not intended to diagnose infection nor to guide or monitor treatment.     Radiology Reports Dg Chest 1 View  04/19/2015  CLINICAL DATA:  [place] this morning. Left hip pain. History of hypertension. EXAM: CHEST 1 VIEW COMPARISON:  09/24/2014 FINDINGS: Cardiac silhouette is mildly enlarged. There are no mediastinal or hilar masses or evidence of adenopathy. There is some central vascular prominence accentuated by low lung volumes. Linear scarring is noted in the right mid lung. No convincing pneumonia or pulmonary edema. No pleural effusion or pneumothorax. Bony thorax is demineralized but grossly intact. IMPRESSION: No acute cardiopulmonary disease. Electronically Signed   By: Lajean Manes M.D.   On: 04/19/2015 15:12   Dg Hip Unilat With Pelvis 2-3 Views Left  04/19/2015  CLINICAL DATA:  Status post fall this morning with a left hip injury. Continued pain. Initial encounter. EXAM: DG HIP (WITH OR WITHOUT PELVIS) 2-3V LEFT COMPARISON:  None. FINDINGS: The patient has an acute left intertrochanteric fracture. The femoral head is located. No other acute bony or joint abnormality is seen. Lower lumbar spondylosis noted. IMPRESSION: Acute left intertrochanteric fracture.  Electronically Signed   By: Inge Rise M.D.   On: 04/19/2015 15:11   Dg Femur 1v Left  04/19/2015  CLINICAL DATA:  [place] earlier this morning, LEFT hip pain, history hypertension, asthma, GERD, dementia EXAM: LEFT FEMUR 1 VIEW COMPARISON:  None FINDINGS: Osseous demineralization. Hip and knee joint alignments grossly normal for positioning. Displaced and comminuted intertrochanteric fracture proximal LEFT femur. No dislocation. Visualized LEFT pelvis intact. Mild atherosclerotic calcifications at the mid LEFT superficial femoral artery. IMPRESSION: Osseous demineralization with a comminuted displaced intertrochanteric fracture LEFT femur. Electronically Signed   By: Lavonia Dana M.D.   On: 04/19/2015 15:11     CBC  Recent Labs Lab 04/19/15 1415 04/20/15 0526  WBC 13.7* 5.8  HGB 13.1 10.1*  HCT 41.2 31.5*  PLT 170 140*  MCV 91.2 91.0  MCH 29.0 29.2  MCHC 31.8 32.1  RDW 13.9 14.2  LYMPHSABS 0.9  --   MONOABS 0.9  --   EOSABS 0.0  --  BASOSABS 0.0  --     Chemistries   Recent Labs Lab 04/19/15 1415 04/19/15 1611 04/20/15 0526  NA 142  --  141  K 4.6  --  4.2  CL 107  --  108  CO2 25  --  24  GLUCOSE 149*  --  134*  BUN 33*  --  34*  CREATININE 1.83*  --  1.50*  CALCIUM 8.7*  --  7.9*  MG  --  2.0  --   AST  --   --  22  ALT  --   --  15  ALKPHOS  --   --  55  BILITOT  --   --  0.6   ------------------------------------------------------------------------------------------------------------------ estimated creatinine clearance is 27.3 mL/min (by C-G formula based on Cr of 1.5). ------------------------------------------------------------------------------------------------------------------ No results for input(s): HGBA1C in the last 72 hours. ------------------------------------------------------------------------------------------------------------------ No results for input(s): CHOL, HDL, LDLCALC, TRIG, CHOLHDL, LDLDIRECT in the last 72  hours. ------------------------------------------------------------------------------------------------------------------  Recent Labs  04/19/15 1611  TSH 2.320   ------------------------------------------------------------------------------------------------------------------ No results for input(s): VITAMINB12, FOLATE, FERRITIN, TIBC, IRON, RETICCTPCT in the last 72 hours.  Coagulation profile  Recent Labs Lab 04/19/15 1415  INR 1.07    No results for input(s): DDIMER in the last 72 hours.  Cardiac Enzymes No results for input(s): CKMB, TROPONINI, MYOGLOBIN in the last 168 hours.  Invalid input(s): CK ------------------------------------------------------------------------------------------------------------------ Invalid input(s): POCBNP   CBG: No results for input(s): GLUCAP in the last 168 hours.     Studies: Dg Chest 1 View  04/19/2015  CLINICAL DATA:  [place] this morning. Left hip pain. History of hypertension. EXAM: CHEST 1 VIEW COMPARISON:  09/24/2014 FINDINGS: Cardiac silhouette is mildly enlarged. There are no mediastinal or hilar masses or evidence of adenopathy. There is some central vascular prominence accentuated by low lung volumes. Linear scarring is noted in the right mid lung. No convincing pneumonia or pulmonary edema. No pleural effusion or pneumothorax. Bony thorax is demineralized but grossly intact. IMPRESSION: No acute cardiopulmonary disease. Electronically Signed   By: Lajean Manes M.D.   On: 04/19/2015 15:12   Dg Hip Unilat With Pelvis 2-3 Views Left  04/19/2015  CLINICAL DATA:  Status post fall this morning with a left hip injury. Continued pain. Initial encounter. EXAM: DG HIP (WITH OR WITHOUT PELVIS) 2-3V LEFT COMPARISON:  None. FINDINGS: The patient has an acute left intertrochanteric fracture. The femoral head is located. No other acute bony or joint abnormality is seen. Lower lumbar spondylosis noted. IMPRESSION: Acute left intertrochanteric  fracture. Electronically Signed   By: Inge Rise M.D.   On: 04/19/2015 15:11   Dg Femur 1v Left  04/19/2015  CLINICAL DATA:  [place] earlier this morning, LEFT hip pain, history hypertension, asthma, GERD, dementia EXAM: LEFT FEMUR 1 VIEW COMPARISON:  None FINDINGS: Osseous demineralization. Hip and knee joint alignments grossly normal for positioning. Displaced and comminuted intertrochanteric fracture proximal LEFT femur. No dislocation. Visualized LEFT pelvis intact. Mild atherosclerotic calcifications at the mid LEFT superficial femoral artery. IMPRESSION: Osseous demineralization with a comminuted displaced intertrochanteric fracture LEFT femur. Electronically Signed   By: Lavonia Dana M.D.   On: 04/19/2015 15:11      Lab Results  Component Value Date   HGBA1C 5.8 11/11/2014   HGBA1C 6.0* 06/29/2014   Lab Results  Component Value Date   LDLCALC 112* 03/02/2014   CREATININE 1.50* 04/20/2015       Scheduled Meds: . aspirin EC  81 mg Oral Daily  .  ceFAZolin (ANCEF) IV  2 g Intravenous 30 min Pre-Op  . docusate sodium  100 mg Oral BID  . donepezil  10 mg Oral QHS  . senna  1 tablet Oral BID  . simvastatin  40 mg Oral QHS  . sodium chloride flush  3 mL Intravenous Q12H   Continuous Infusions: . sodium chloride 100 mL/hr at 04/20/15 1020    Principal Problem:   Intertrochanteric fracture of left femur (H[facility]) Active Problems:   Essential hypertension   Dementia   History of positive PPD   Hearing aid worn   Obesity   Chronic asthma without complication   Hypothyroidism (acquired)   Fracture, intertrochanteric, left femur ([place])    Time spent: 45 minutes   [facility] Pager [phone]. If 7PM-7AM, please contact night-coverage at [email or web address], password [facility] 04/20/2015, 11:21 AM  LOS: 1 day

## 2015-04-20 NOTE — Progress Notes (Signed)
4th floor notified pt will be in room at [place].

## 2015-04-20 NOTE — Brief Op Note (Signed)
04/19/2015 - 04/20/2015  6:25 PM  PATIENT:  Kelly Conrad  82 y.o. female  PRE-OPERATIVE DIAGNOSIS:  LEFT FEMUR FRACTURE  POST-OPERATIVE DIAGNOSIS:  LEFT FEMUR FRACTURE  PROCEDURE:  Procedure(s): INTRAMEDULLARY (IM) NAIL FEMORAL (Left)  SURGEON:  Surgeon(s) and Role:    * Rod Can, MD - Primary  PHYSICIAN ASSISTANT: none  ASSISTANTS: none   ANESTHESIA:   general  EBL:  Total I/O In: 1858.3 [I.V.:1858.3] Out: 700 [Urine:550; Blood:150]  BLOOD ADMINISTERED:none  DRAINS: none   LOCAL MEDICATIONS USED:  NONE  SPECIMEN:  No Specimen  DISPOSITION OF SPECIMEN:  N/A  COUNTS:  YES  TOURNIQUET:  * No tourniquets in log *  DICTATION: .Other Dictation: Dictation Number [id number]  PLAN OF CARE: Admit to inpatient   PATIENT DISPOSITION:  PACU - hemodynamically stable.   Delay start of Pharmacological VTE agent (>24hrs) due to surgical blood loss or risk of bleeding: yes

## 2015-04-20 NOTE — Anesthesia Preprocedure Evaluation (Addendum)
Anesthesia Evaluation  Patient identified by MRN, date of birth, ID band Patient awake and Patient confused    Reviewed: Allergy & Precautions, NPO status , Patient's Chart, lab work & pertinent test results  Airway Mallampati: I  TM Distance: <3 FB     Dental   Pulmonary shortness of breath and with exertion, asthma , COPD,    + rhonchi  + decreased breath sounds      Cardiovascular hypertension, Normal cardiovascular exam     Neuro/Psych    GI/Hepatic GERD  ,  Endo/Other  Hypothyroidism   Renal/GU Renal InsufficiencyRenal disease     Musculoskeletal  (+) Arthritis ,   Abdominal   Peds  Hematology   Anesthesia Other Findings Dementia HOH  Reproductive/Obstetrics                            Anesthesia Physical Anesthesia Plan  ASA: III  Anesthesia Plan: General   Post-op Pain Management:    Induction: Intravenous  Airway Management Planned: Oral ETT  Additional Equipment:   Intra-op Plan:   Post-operative Plan: Extubation in OR and Possible Post-op intubation/ventilation  Informed Consent: I have reviewed the patients History and Physical, chart, labs and discussed the procedure including the risks, benefits and alternatives for the proposed anesthesia with the patient or authorized representative who has indicated his/her understanding and acceptance.     Plan Discussed with: CRNA, Anesthesiologist and Surgeon  Anesthesia Plan Comments:        Anesthesia Quick Evaluation

## 2015-04-20 NOTE — H&P (View-Only) (Signed)
Patient ID: MURRAY VODA, female   DOB: December 28, 1931, 82 y.o.   MRN: [id number]    Patient ID: JAZZLYNE MECKLENBURG MRN: [id number] DOB/AGE: [birth year] 82 y.o.  Admit date: 04/19/2015  Admission Diagnoses:  Principal Problem:   Intertrochanteric fracture of left femur (HCC) Active Problems:   Essential hypertension   Dementia   History of positive PPD   Hearing aid worn   Obesity   Chronic asthma without complication   Hypothyroidism (acquired)   Fracture, intertrochanteric, left femur (HCC)   HPI: Very pleasant 82 year old female pt.  She was mopping her kitchen this afternoon and slipped and fell.  She lives with a family member.  He reports she is normally functional and ambulatory.  The family member attempted to help her up but was unable to do so. EMT was called and she was brought to the ED.    Past Medical History: Past Medical History  Diagnosis Date  . Allergy   . Hypertension   . Hyperlipidemia   . GERD (gastroesophageal reflux disease)   . Asthma   . Hx of varicella   . History of positive PPD     gets c xray screen  . Dementia     Surgical History: History reviewed. No pertinent past surgical history.  Family History: Family History  Problem Relation Age of Onset  . Hypertension      fhx  . Diabetes Mother   . Stroke Mother   . Diabetes Father   . Edema Father     legs    Social History: Social History   Social History  . Marital Status: Single    Spouse Name: N/A  . Number of Children: N/A  . Years of Education: N/A   Occupational History  . Not on file.   Social History Main Topics  . Smoking status: Never Smoker   . Smokeless tobacco: Never Used  . Alcohol Use: No  . Drug Use: No  . Sexual Activity: No   Other Topics Concern  . 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 [name]    He is the main caretaker. Dreanna took care of his mom  Who has since  passed away.   He has some vision problems and partial amputation but  No mobiilty problems       Neg ets FA safely stored smoke alarm   No tob or etoh.    Allergies: Review of patient's allergies indicates no known allergies.  Medications: I have reviewed the patient's current medications.  Vital Signs: Patient Vitals for the past 24 hrs:  BP Temp Temp src Pulse Resp SpO2 Weight  04/19/15 1504 112/72 mmHg 98 F (36.7 C) - 67 14 98 % -  04/19/15 1347 (!) 128/54 mmHg 97.6 F (36.4 C) Oral 63 16 98 % 76.658 kg (169 lb)    Radiology: Dg Chest 1 View  04/19/2015  CLINICAL DATA:  [place] this morning. Left hip pain. History of hypertension. EXAM: CHEST 1 VIEW COMPARISON:  09/24/2014 FINDINGS: Cardiac silhouette is mildly enlarged. There are no mediastinal or hilar masses or evidence of adenopathy. There is some central vascular prominence accentuated by low lung volumes. Linear scarring is noted in the right mid lung. No convincing pneumonia or pulmonary edema. No pleural effusion or pneumothorax. Bony thorax is demineralized but grossly intact. IMPRESSION: No acute cardiopulmonary disease. Electronically Signed  By: Lajean Manes M.D.   On: 04/19/2015 15:12   Dg Hip Unilat With Pelvis 2-3 Views Left  04/19/2015  CLINICAL DATA:  Status post fall this morning with a left hip injury. Continued pain. Initial encounter. EXAM: DG HIP (WITH OR WITHOUT PELVIS) 2-3V LEFT COMPARISON:  None. FINDINGS: The patient has an acute left intertrochanteric fracture. The femoral head is located. No other acute bony or joint abnormality is seen. Lower lumbar spondylosis noted. IMPRESSION: Acute left intertrochanteric fracture. Electronically Signed   By: Inge Rise M.D.   On: 04/19/2015 15:11   Dg Femur 1v Left  04/19/2015  CLINICAL DATA:  [place] earlier this morning, LEFT hip pain, history hypertension, asthma, GERD, dementia EXAM: LEFT FEMUR 1 VIEW COMPARISON:  None FINDINGS: Osseous demineralization. Hip and  knee joint alignments grossly normal for positioning. Displaced and comminuted intertrochanteric fracture proximal LEFT femur. No dislocation. Visualized LEFT pelvis intact. Mild atherosclerotic calcifications at the mid LEFT superficial femoral artery. IMPRESSION: Osseous demineralization with a comminuted displaced intertrochanteric fracture LEFT femur. Electronically Signed   By: Lavonia Dana M.D.   On: 04/19/2015 15:11    Labs:  Recent Labs  04/19/15 1415  WBC 13.7*  RBC 4.52  HCT 41.2  PLT 170    Recent Labs  04/19/15 1415  NA 142  K 4.6  CL 107  CO2 25  BUN 33*  CREATININE 1.83*  GLUCOSE 149*  CALCIUM 8.7*    Recent Labs  04/19/15 1415  INR 1.07    Review of Systems: ROS  Physical Exam: Neurologically intact ABD soft Sensation intact distally Compartment soft  Exquisite pain of the left anterior hip Left leg externally rotated  Assessment and Plan: NPO after midnight Dr. Lyla Glassing will surgically repair tomorrow   Ronette Deter, PA-C [place] [phone]

## 2015-04-20 NOTE — Interval H&P Note (Signed)
History and Physical Interval Note:  04/20/2015 4:39 PM  Kelly Conrad  has presented today for surgery, with the diagnosis of LEFT FEMUR FRACTURE  The various methods of treatment have been discussed with the patient and family. After consideration of risks, benefits and other options for treatment, the patient has consented to  Procedure(s): INTRAMEDULLARY (IM) NAIL FEMORAL (Left) as a surgical intervention .  The patient's history has been reviewed, patient examined, no change in status, stable for surgery.  I have reviewed the patient's chart and labs.  Questions were answered to the patient's satisfaction.     Wakeelah Solan, Horald Pollen

## 2015-04-20 NOTE — Anesthesia Postprocedure Evaluation (Signed)
Anesthesia Post Note  Patient: Kelly Conrad  Procedure(s) Performed: Procedure(s) (LRB): INTRAMEDULLARY (IM) NAIL FEMORAL (Left)  Patient location during evaluation: PACU Anesthesia Type: General Level of consciousness: awake, oriented, sedated and patient cooperative Pain management: pain level controlled Vital Signs Assessment: post-procedure vital signs reviewed and stable Respiratory status: spontaneous breathing and respiratory function stable Cardiovascular status: blood pressure returned to baseline Anesthetic complications: no    Last Vitals:  Filed Vitals:   04/20/15 1900 04/20/15 1915  BP: 134/83 114/87  Pulse: 79 73  Temp:  36.5 C  Resp: 18 21    Last Pain:  Filed Vitals:   04/20/15 1923  PainSc: 0-No pain                 Nasrin Lanzo EDWARD

## 2015-04-20 NOTE — Discharge Instructions (Signed)
 Dr. Alithea Lapage Adult Hip & Knee Specialist [facility] [address] [place], [place] [address] [phone]   POSTOPERATIVE DIRECTIONS    Hip Rehabilitation, Guidelines Following Surgery   WEIGHT BEARING Weight bearing as tolerated with assist device (walker, cane, etc) as directed, use it as long as suggested by your surgeon or therapist, typically at least 4-6 weeks.   HOME CARE INSTRUCTIONS  Remove items at home which could result in a fall. This includes throw rugs or furniture in walking pathways.  Continue medications as instructed at time of discharge.  You may have some home medications which will be placed on hold until you complete the course of blood thinner medication.  4 days after discharge, you may start showering. No tub baths or soaking your incisions. Do not put on socks or shoes without following the instructions of your caregivers.   Sit on chairs with arms. Use the chair arms to help push yourself up when arising.  Arrange for the use of a toilet seat elevator so you are not sitting low.   Walk with walker as instructed.  You may resume a sexual relationship in one month or when given the OK by your caregiver.  Use walker as long as suggested by your caregivers.  Avoid periods of inactivity such as sitting longer than an hour when not asleep. This helps prevent blood clots.  You may return to work once you are cleared by your surgeon.  Do not drive a car for 6 weeks or until released by your surgeon.  Do not drive while taking narcotics.  Wear elastic stockings for two weeks following surgery during the day but you may remove then at night.  Make sure you keep all of your appointments after your operation with all of your doctors and caregivers. You should call the office at the above phone number and make an appointment for approximately two weeks after the date of your surgery. Please pick up a stool softener and laxative  for home use as long as you are requiring pain medications.  ICE to the affected hip every three hours for 30 minutes at a time and then as needed for pain and swelling. Continue to use ice on the hip for pain and swelling from surgery. You may notice swelling that will progress down to the foot and ankle.  This is normal after surgery.  Elevate the leg when you are not up walking on it.   It is important for you to complete the blood thinner medication as prescribed by your doctor.  Continue to use the breathing machine which will help keep your temperature down.  It is common for your temperature to cycle up and down following surgery, especially at night when you are not up moving around and exerting yourself.  The breathing machine keeps your lungs expanded and your temperature down.  RANGE OF MOTION AND STRENGTHENING EXERCISES  These exercises are designed to help you keep full movement of your hip joint. Follow your caregiver's or physical therapist's instructions. Perform all exercises about fifteen times, three times per day or as directed. Exercise both hips, even if you have had only one joint replacement. These exercises can be done on a training (exercise) mat, on the floor, on a table or on a bed. Use whatever works the best and is most comfortable for you. Use music or television while you are exercising so that the exercises are a pleasant break in your day. This   will make your life better with the exercises acting as a break in routine you can look forward to.  Lying on your back, slowly slide your foot toward your buttocks, raising your knee up off the floor. Then slowly slide your foot back down until your leg is straight again.  Lying on your back spread your legs as far apart as you can without causing discomfort.  Lying on your side, raise your upper leg and foot straight up from the floor as far as is comfortable. Slowly lower the leg and repeat.  Lying on your back, tighten up the  muscle in the front of your thigh (quadriceps muscles). You can do this by keeping your leg straight and trying to raise your heel off the floor. This helps strengthen the largest muscle supporting your knee.  Lying on your back, tighten up the muscles of your buttocks both with the legs straight and with the knee bent at a comfortable angle while keeping your heel on the floor.   SKILLED REHAB INSTRUCTIONS: If the patient is transferred to a skilled rehab facility following release from the hospital, a list of the current medications will be sent to the facility for the patient to continue.  When discharged from the skilled rehab facility, please have the facility set up the patient's Home Health Physical Therapy prior to being released. Also, the skilled facility will be responsible for providing the patient with their medications at time of release from the facility to include their pain medication and their blood thinner medication. If the patient is still at the rehab facility at time of the two week follow up appointment, the skilled rehab facility will also need to assist the patient in arranging follow up appointment in our office and any transportation needs.  MAKE SURE YOU:  Understand these instructions.  Will watch your condition.  Will get help right away if you are not doing well or get worse.  Pick up stool softner and laxative for home use following surgery while on pain medications. Daily dry dressing changes as needed. In 4 days, you may remove your dressings and begin taking showers - no tub baths or soaking the incisions. Continue to use ice for pain and swelling after surgery. Do not use any lotions or creams on the incision until instructed by your surgeon.   

## 2015-04-20 NOTE — Transfer of Care (Signed)
Immediate Anesthesia Transfer of Care Note  Patient: Kelly Conrad  Procedure(s) Performed: Procedure(s): INTRAMEDULLARY (IM) NAIL FEMORAL (Left)  Patient Location: PACU  Anesthesia Type:General  Level of Consciousness:  sedated, patient cooperative and responds to stimulation  Airway & Oxygen Therapy:Patient Spontanous Breathing and Patient connected to face mask oxgen  Post-op Assessment:  Report given to PACU RN and Post -op Vital signs reviewed and stable  Post vital signs:  Reviewed and stable  Last Vitals:  Filed Vitals:   04/20/15 0621 04/20/15 1301  BP: 107/47 113/49  Pulse: 68 71  Temp: 37.3 C 37 C  Resp: 18 16    Complications: No apparent anesthesia complications

## 2015-04-20 NOTE — Telephone Encounter (Signed)
Sent to the pharmacy by e-scribe. 

## 2015-04-20 NOTE — Progress Notes (Signed)
Asked to assume care by Dr. Rolena Infante. Patient seen and evaluated. Slipped on wet floor while mopping. Lives at home with friend, [name]. Has displaced L IT femur fx.  -Plan for surgery this afternoon -NPO -Hold chemical DVT ppx (received lovenox early this am)   The risks, benefits, and alternatives were discussed with the patient. There are risks associated with the surgery including, but not limited to, problems with anesthesia (death), infection, differences in leg length/angulation/rotation, fracture of bones, loosening or failure of implants, malunion, nonunion, hematoma (blood accumulation) which may require surgical drainage, blood clots, pulmonary embolism, nerve injury (foot drop), and blood vessel injury. The patient understands these risks and elects to proceed.

## 2015-04-20 NOTE — Progress Notes (Signed)
Patient was transported down to OR, Tele on bedside table

## 2015-04-21 ENCOUNTER — Encounter (HOSPITAL_COMMUNITY): Payer: Self-pay | Admitting: Orthopedic Surgery

## 2015-04-21 DIAGNOSIS — I10 Essential (primary) hypertension: Secondary | ICD-10-CM

## 2015-04-21 LAB — COMPREHENSIVE METABOLIC PANEL
ALBUMIN: 2.5 g/dL — AB (ref 3.5–5.0)
ALT: 15 U/L (ref 14–54)
AST: 26 U/L (ref 15–41)
Alkaline Phosphatase: 50 U/L (ref 38–126)
Anion gap: 5 (ref 5–15)
BUN: 23 mg/dL — AB (ref 6–20)
CHLORIDE: 106 mmol/L (ref 101–111)
CO2: 25 mmol/L (ref 22–32)
CREATININE: 1.35 mg/dL — AB (ref 0.44–1.00)
Calcium: 7.4 mg/dL — ABNORMAL LOW (ref 8.9–10.3)
GFR calc Af Amer: 41 mL/min — ABNORMAL LOW (ref >60)
GFR, EST NON AFRICAN AMERICAN: 35 mL/min — AB (ref >60)
GLUCOSE: 127 mg/dL — AB (ref 65–99)
Potassium: 4.1 mmol/L (ref 3.5–5.1)
Sodium: 136 mmol/L (ref 135–145)
Total Bilirubin: 0.5 mg/dL (ref 0.3–1.2)
Total Protein: 4.6 g/dL — ABNORMAL LOW (ref 6.5–8.1)

## 2015-04-21 LAB — CBC
HCT: 24.3 % — ABNORMAL LOW (ref 36.0–46.0)
Hemoglobin: 7.9 g/dL — ABNORMAL LOW (ref 12.0–15.0)
MCH: 29.6 pg (ref 26.0–34.0)
MCHC: 32.5 g/dL (ref 30.0–36.0)
MCV: 91 fL (ref 78.0–100.0)
PLATELETS: 116 10*3/uL — AB (ref 150–400)
RBC: 2.67 MIL/uL — AB (ref 3.87–5.11)
RDW: 14.2 % (ref 11.5–15.5)
WBC: 4.9 10*3/uL (ref 4.0–10.5)

## 2015-04-21 MED ORDER — ENOXAPARIN SODIUM 40 MG/0.4ML ~~LOC~~ SOLN: 40.0000 mg | SUBCUTANEOUS | Status: DC

## 2015-04-21 MED ORDER — HYDROCODONE-ACETAMINOPHEN 5-325 MG PO TABS: 1.0000 | ORAL_TABLET | Freq: Four times a day (QID) | ORAL | Status: DC | PRN

## 2015-04-21 NOTE — NC FL2 (Signed)
[place] LEVEL OF CARE SCREENING TOOL     IDENTIFICATION  Patient Name: Kelly Conrad Birthdate: 04/20/1931 Sex: female Admission Date (Current Location): 04/19/2015  [facility] and [place] Number:  Herbalist and Address:  [facility],  [place] [address], [place]      Provider Number: [id number]  Attending Physician Name and Address:  Reyne Dumas, MD  Relative Name and Phone Number:       Current Level of Care: Hospital Recommended Level of Care: [place] Prior Approval Number:    Date Approved/Denied:   PASRR Number:   [id number]  Discharge Plan: Home    Current Diagnoses: Patient Active Problem List   Diagnosis Date Noted  . Pertrochanteric fracture of left femur ([place]) 04/20/2015  . Intertrochanteric fracture of left femur ([place]) 04/19/2015  . Chronic asthma without complication 123XX123  . Hypothyroidism (acquired) 04/19/2015  . Fracture, intertrochanteric, left femur ([place]) 04/19/2015  . Closed left hip fracture ([place])   . Hx of bacterial pneumonia 11/11/2014  . Obesity 09/24/2014  . Pneumonia due to Haemophilus influenzae with late R parapneumonic effusion/scarring  07/14/2014  . Acute respiratory failure ([place])   . Shortness of breath   . CAP (community acquired pneumonia) 06/28/2014  . Sepsis ([place]) 06/28/2014  . Blood poisoning ([place])   . Visit for preventive health examination 11/24/2012  . Medicare annual wellness visit, subsequent 11/24/2012  . Tremor 05/22/2012  . Hearing aid worn 05/22/2012  . Benign hypertensive renal disease 05/22/2012  . History of positive PPD   . Renal insufficiency 04/29/2011  . Dementia 04/23/2011  . COUGH 06/15/2009  . Osteoporosis 04/20/2009  . VITAMIN D DEFICIENCY 01/29/2008  . ASTHMA 01/29/2008  . HYPOTHYROIDISM NOS 01/07/2007  . ADVEF, DRUG/MED/BIOL SUBST, OTHER DRUG NOS 01/07/2007  . HYPERLIPIDEMIA 01/03/2007  . Essential hypertension 01/03/2007  . ALLERGIC  RHINITIS 01/03/2007  . GERD 01/03/2007    Orientation RESPIRATION BLADDER Height & Weight     Self, Time, Situation, Place  O2 (2L) Continent Weight: 171 lb 1.6 oz (77.61 kg) Height:  5\' 1"  (154.9 cm)  BEHAVIORAL SYMPTOMS/MOOD NEUROLOGICAL BOWEL NUTRITION STATUS      Continent Diet (regular)  AMBULATORY STATUS COMMUNICATION OF NEEDS Skin   Extensive Assist Verbally Surgical wounds (Incision(Closed)02/08/17HipLeft, Thigh Left,  Knee Left)                       Personal Care Assistance Level of Assistance  Bathing, Dressing Bathing Assistance: Limited assistance   Dressing Assistance: Limited assistance     Functional Limitations Info             SPECIAL CARE FACTORS FREQUENCY  PT (By licensed PT), OT (By licensed OT)     PT Frequency: 5 OT Frequency: 5            Contractures Contractures Info: Not present    Additional Factors Info  Code Status, Allergies Code Status Info: DNR Allergies Info: NKDA           Current Medications (04/21/2015):  This is the current hospital active medication list Current Facility-Administered Medications  Medication Dose Route Frequency Provider Last Rate Last Dose  . 0.9 %  sodium chloride infusion   Intravenous Continuous Deno Etienne, DO 100 mL/hr at 04/21/15 1043    . acetaminophen (TYLENOL) tablet 650 mg  650 mg Oral Q6H PRN Rod Can, MD   650 mg at 04/21/15 1436   Or  . acetaminophen (  TYLENOL) suppository 650 mg  650 mg Rectal Q6H PRN Rod Can, MD      . docusate sodium (COLACE) capsule 100 mg  100 mg Oral BID Rod Can, MD   100 mg at 04/21/15 1042  . donepezil (ARICEPT) tablet 10 mg  10 mg Oral QHS Reyne Dumas, MD   10 mg at 04/20/15 2226  . enoxaparin (LOVENOX) injection 40 mg  40 mg Subcutaneous Q24H Rod Can, MD   40 mg at 04/21/15 1042  . guaiFENesin (MUCINEX) 12 hr tablet 600 mg  600 mg Oral BID PRN Reyne Dumas, MD      . hydrochlorothiazide (MICROZIDE) capsule 12.5 mg  12.5 mg Oral  Daily Rod Can, MD   12.5 mg at 04/21/15 1042  . HYDROcodone-acetaminophen (NORCO/VICODIN) 5-325 MG per tablet 1-2 tablet  1-2 tablet Oral Q6H PRN Rod Can, MD      . HYDROmorphone (DILAUDID) injection 0.5 mg  0.5 mg Intravenous Q4H PRN Reyne Dumas, MD   0.5 mg at 04/19/15 1620  . levalbuterol (XOPENEX) nebulizer solution 0.63 mg  0.63 mg Nebulization Q6H PRN Reyne Dumas, MD      . menthol-cetylpyridinium (CEPACOL) lozenge 3 mg  1 lozenge Oral PRN Rod Can, MD       Or  . phenol (CHLORASEPTIC) mouth spray 1 spray  1 spray Mouth/Throat PRN Rod Can, MD      . metoCLOPramide (REGLAN) tablet 5-10 mg  5-10 mg Oral Q8H PRN Rod Can, MD       Or  . metoCLOPramide (REGLAN) injection 5-10 mg  5-10 mg Intravenous Q8H PRN Rod Can, MD      . ondansetron [facility]) tablet 4 mg  4 mg Oral Q6H PRN Rod Can, MD       Or  . ondansetron (ZOFRAN) injection 4 mg  4 mg Intravenous Q6H PRN Rod Can, MD      . senna (SENOKOT) tablet 8.6 mg  1 tablet Oral BID Rod Can, MD   8.6 mg at 04/21/15 1042  . simvastatin (ZOCOR) tablet 40 mg  40 mg Oral QHS Reyne Dumas, MD   40 mg at 04/20/15 2226  . sodium chloride flush (NS) 0.9 % injection 3 mL  3 mL Intravenous Q12H Reyne Dumas, MD   3 mL at 04/20/15 2227     Discharge Medications: Please see discharge summary for a list of discharge medications.  Relevant Imaging Results:  Relevant Lab Results:   Additional Information SSN: [id number]  Standley Brooking, LCSW

## 2015-04-21 NOTE — Clinical Social Work Placement (Signed)
   CLINICAL SOCIAL WORK PLACEMENT  NOTE  Date:  04/21/2015  Patient Details  Name: Kelly Conrad MRN: [id number] Date of Birth: 16-Dec-1931  Clinical Social Work is seeking post-discharge placement for this patient at the [place] level of care (*CSW will initial, date and re-position this form in  chart as items are completed):  Yes   Patient/family provided with [place] Work Department's list of facilities offering this level of care within the geographic area requested by the patient (or if unable, by the patient's family).  Yes   Patient/family informed of their freedom to choose among providers that offer the needed level of care, that participate in Medicare, Medicaid or managed care program needed by the patient, have an available bed and are willing to accept the patient.  Yes   Patient/family informed of [facility]'s ownership interest in [facility] and [facility], as well as of the fact that they are under no obligation to receive care at these facilities.  PASRR submitted to EDS on 04/21/15     PASRR number received on 04/21/15     Existing PASRR number confirmed on       FL2 transmitted to all facilities in geographic area requested by pt/family on 04/21/15     FL2 transmitted to all facilities within larger geographic area on       Patient informed that his/her managed care company has contracts with or will negotiate with certain facilities, including the following:            Patient/family informed of bed offers received.  Patient chooses bed at       Physician recommends and patient chooses bed at      Patient to be transferred to   on  .  Patient to be transferred to facility by       Patient family notified on   of transfer.  Name of family member notified:        PHYSICIAN       Additional Comment:    _______________________________________________ Standley Brooking, LCSW 04/21/2015, 4:43 PM

## 2015-04-21 NOTE — Op Note (Signed)
Kelly Conrad, Kelly Conrad                 ACCOUNT NO.:  [id number]  MEDICAL RECORD NO.:  [id number]  LOCATION:  [id number]                         FACILITY:  [facility]  PHYSICIAN:  Rod Can, MD     DATE OF BIRTH:  February 08, 1932  DATE OF PROCEDURE:  04/20/2015 DATE OF DISCHARGE:                              OPERATIVE REPORT   SURGEON:  Rod Can, MD.  ASSISTANT:  None.  PREOPERATIVE DIAGNOSIS:  Left pertrochanteric femur fracture.  POSTOPERATIVE DIAGNOSIS:  Left pertrochanteric femur fracture.  PROCEDURE PERFORMED:  Cephalomedullary fixation of left pertrochanteric femur fracture.  IMPLANTS: 1. Synthes TFNA nail, 11 x 360 mm, 130 degrees. 2. TFNA helical blade, 123XX123 mm. 3. A 5.0-mm distal interlocking screw x1.  EBL:  150 mL.  ANTIBIOTICS:  2 g of Ancef.  SPECIMENS:  None.  TUBES AND DRAINS:  None.  COMPLICATIONS:  None.  DISPOSITION:  Stable to PACU.  INDICATIONS:  The patient is an 82 year old female, who slipped and fell on the wet floor while mopping at home yesterday.  She had hip pain and inability to weight bear.  She came to the emergency department, was found to have a left pertrochanteric femur fracture.  She was admitted to the Hospitalist Service.  She underwent perioperative risk stratification, medical optimization.  Risks, benefits and alternatives of surgical fixation were explained, she elected to proceed.  DESCRIPTION OF PROCEDURE IN DETAIL:  I identified the patient in the holding area using two identifiers.  Surgical site was marked by myself. She was taken to the operating room, general anesthesia was induced on her bed.  She was transferred to the [facility] table.  The right lower extremity was scissored underneath the left.  I reduced the fracture with standard traction, internal rotation, adduction.  The left hip was prepped and draped in normal sterile surgical fashion.  Time-out was called verifying the side and site of surgery.  She received  IV antibiotics within 60 minutes at the beginning of procedure.  I began by using fluoroscopy to define her anatomy.  I made a 4-cm incision proximal to the tip of the trochanter.  I used a starting awl to determine the standard starting point for trochanteric entry nail.  I placed the guidepin to the level of the lesser trochanter.  I used the entry reamer.  I placed the long guidewire to the physeal scar of the knee.  I measured the length of the nail, 360.  I reamed sequentially up to a 12.5 reamer, excellent chatter.  Therefore, I selected 11 x 360, nail was passed. Through a separate stab incision, I inserted the cannula for the cephalomedullary device down to the bone.  I then placed the guidepin under live fluoro control.  I then measured, reamed, placed the real TFNA blade.  I compressed the fracture through the jig.  I tightened the set screw, loosened at one-quarter turn.  I removed the jig.  Final AP and lateral fluoroscopy views were used to confirm near anatomic fracture, reduction, recreation of appropriate neck shaft angle and appropriate tip apex distance.  There was no chondral penetration. I turned my attention distally.  Using perfect circle technique, I placed one  distal interlocking screw.  Final fluoroscopy images were obtained.  Wounds were copiously irrigated with saline.  I closed the wound in layers with #1 Vicryl for the fascia, 2-0 Vicryl for the deep fat, 2-0 Monocryl for the deep dermal layer, running 3-0 Monocryl subcuticular stitch.  Dermabond was applied to the skin once the glue was dried, Mepilex dressings were applied.  The patient was transferred to a bed, extubated, taken to the PACU in stable condition.  Sponge, needle, and instrument counts were correct at the end of the case x2. There were no known complications.  I discussed the operative events and findings with the patient's family. She may weight bear as tolerated with a walker.  Placed her on  Lovenox for DVT prophylaxis beginning tomorrow morning.  She will receive physical and occupational therapy.  We will work on disposition planning.  I will need to see her in 2 weeks after discharge.  All questions solicited and answered.          ______________________________ Rod Can, MD     BS/MEDQ  D:  04/20/2015  T:  04/21/2015  Job:  [id number]

## 2015-04-21 NOTE — Clinical Social Work Note (Signed)
Clinical Social Work Assessment  Patient Details  Name: Kelly Conrad MRN: [id number] Date of Birth: 05-Sep-1931  Date of referral:  04/21/15               Reason for consult:  Facility Placement                Permission sought to share information with:  Chartered certified accountant granted to share information::  Yes, Verbal Permission Granted  Name::        Agency::     Relationship::     Contact Information:     Housing/Transportation Living arrangements for the past 2 months:  Single Family Home Source of Information:  Patient, Partner Patient Interpreter Needed:  None Criminal Activity/Legal Involvement Pertinent to Current Situation/Hospitalization:  No - Comment as needed Significant Relationships:  Significant Other Lives with:  Significant Other Do you feel safe going back to the place where you live?  No Need for family participation in patient care:  No (Coment)  Care giving concerns:  CSW reviewed PT evaluation recommending SNF - spoke with patient & caregiver/significant other, Kelly Conrad via phone (ph#: [phone]).    Social Worker assessment / plan:  Patient & Kelly Conrad were hopeful that she would be able to return home with home health at discharge, though patient was unable to walk with a rolling walker with PT this afternoon.   Employment status:  Retired Nurse, adult PT Recommendations:  [place] / Referral to community resources:  [place]  Patient/Family's Response to care:  Patient's significant other states that he would like to talk with orthopedic MD before making a discharge decision - SNF vs. HH.   Patient/Family's Understanding of and Emotional Response to Diagnosis, Current Treatment, and Prognosis:  Patient was hoping she would be able to do more with therapy this afternoon but seems more accepting of going to SNF than her significant other.   Emotional  Assessment Appearance:  Appears stated age Attitude/Demeanor/Rapport:    Affect (typically observed):  Calm, Accepting, Pleasant Orientation:  Oriented to Self, Oriented to Place, Oriented to  Time, Oriented to Situation Alcohol / Substance use:    Psych involvement (Current and /or in the community):     Discharge Needs  Concerns to be addressed:    Readmission within the last 30 days:    Current discharge risk:    Barriers to Discharge:      Standley Brooking, LCSW 04/21/2015, 4:40 PM

## 2015-04-21 NOTE — Progress Notes (Signed)
   Subjective:  Patient reports pain as mild to moderate.  No c/o. Wants to go home.  Objective:   VITALS:   Filed Vitals:   04/20/15 1900 04/20/15 1915 04/20/15 1930 04/21/15 0501  BP: 134/83 114/87 108/62 128/45  Pulse: 79 73 80 80  Temp:  97.7 F (36.5 C) 98.1 F (36.7 C) 98.6 F (37 C)  TempSrc:    Oral  Resp: 18 21 16 18   Height:      Weight:      SpO2: 100% 100% 100% 100%    ABD soft Sensation intact distally Intact pulses distally Dorsiflexion/Plantar flexion intact Incision: dressing C/D/I Compartment soft   Lab Results  Component Value Date   WBC 4.9 04/21/2015   HGB 7.9* 04/21/2015   HCT 24.3* 04/21/2015   MCV 91.0 04/21/2015   PLT 116* 04/21/2015   BMET    Component Value Date/Time   NA 136 04/21/2015 0448   K 4.1 04/21/2015 0448   CL 106 04/21/2015 0448   CO2 25 04/21/2015 0448   GLUCOSE 127* 04/21/2015 0448   BUN 23* 04/21/2015 0448   CREATININE 1.35* 04/21/2015 0448   CALCIUM 7.4* 04/21/2015 0448   GFRNONAA 35* 04/21/2015 0448   GFRAA 41* 04/21/2015 0448     Assessment/Plan: 1 Day Post-Op   Principal Problem:   Intertrochanteric fracture of left femur (HCC) Active Problems:   Essential hypertension   Dementia   History of positive PPD   Hearing aid worn   Obesity   Chronic asthma without complication   Hypothyroidism (acquired)   Fracture, intertrochanteric, left femur ([place])   Pertrochanteric fracture of left femur ([place])   WBAT with walker DVT ppx: lovenox x30 days, SCDs PO pain control PT/OT Dispo: wants to go home with HHPT - has 24 hr care giver [name])    Tyrese Capriotti, Horald Pollen 04/21/2015, 7:13 AM   Rod Can, MD Cell [phone]

## 2015-04-21 NOTE — Evaluation (Signed)
Occupational Therapy Evaluation Patient Details Name: Kelly Conrad MRN: [id number] DOB: Jan 16, 1932 Today's Date: 04/21/2015    History of Present Illness fixation of left pertrochanteric femur fracture   Clinical Impression   Pt admitted with hip fx. Pt currently with functional limitations due to the deficits listed below (see OT Problem List).  Pt will benefit from skilled OT to increase their safety and independence with ADL and functional mobility for ADL to facilitate discharge to venue listed below.      Follow Up Recommendations  Home health OT    Equipment Recommendations  3 in 1 bedside comode       Precautions / Restrictions Precautions Precautions: None      Mobility Bed Mobility Overal bed mobility: Needs Assistance Bed Mobility: Supine to Sit     Supine to sit: Mod assist        Transfers Overall transfer level: Needs assistance Equipment used: 2 person hand held assist Transfers: Sit to/from Stand;Stand Pivot Transfers Sit to Stand: Mod assist;+2 physical assistance;+2 safety/equipment Stand pivot transfers: Mod assist;+2 physical assistance;+2 safety/equipment            Balance                                            ADL Overall ADL's : Needs assistance/impaired     Grooming: Wash/dry face;Brushing hair;Sitting               Lower Body Dressing: Maximal assistance;Sit to/from stand;+2 for physical assistance;+2 for safety/equipment   Toilet Transfer: Maximal assistance;+2 for safety/equipment;+2 for physical assistance;BSC   Toileting- Clothing Manipulation and Hygiene: Maximal assistance;Sit to/from stand;Cueing for sequencing;Cueing for safety;+2 for physical assistance;+2 for safety/equipment               Vision     Perception     Praxis      Pertinent Vitals/Pain Pain Assessment: Faces Faces Pain Scale: Hurts little more Pain Location: L hip with standing Pain Descriptors / Indicators:  Sore Pain Intervention(s): Monitored during session     Hand Dominance     Extremity/Trunk Assessment Upper Extremity Assessment Upper Extremity Assessment: Generalized weakness           Communication Communication Communication: HOH   Cognition Arousal/Alertness: Awake/alert Behavior During Therapy: WFL for tasks assessed/performed Overall Cognitive Status: Within Functional Limits for tasks assessed                                Home Living Family/patient expects to be discharged to:: Private residence Living Arrangements: Non-relatives/Friends Available Help at Discharge: Friend(s);Available PRN/intermittently Type of Home: Mobile home Home Access: Stairs to enter;Ramped entrance Entrance Stairs-Number of Steps: ramp in back   Home Layout: One level     Bathroom Shower/Tub: Teacher, early years/pre: Standard     Home Equipment: Environmental consultant - 2 wheels;Shower seat               OT Diagnosis: Generalized weakness;Acute pain   OT Problem List: Decreased strength;Decreased activity tolerance;Pain   OT Treatment/Interventions: Self-care/ADL training;DME and/or AE instruction;Patient/family education    OT Goals(Current goals can be found in the care plan section) Acute Rehab OT Goals Patient Stated Goal: home with jimmy OT Goal Formulation: With patient Time For Goal Achievement: 05/05/15 Potential to Achieve Goals: Good  ADL Goals Pt Will Perform Lower Body Dressing: with min assist;sit to/from stand;with caregiver independent in assisting Pt Will Transfer to Toilet: with min assist;bedside commode;stand pivot transfer Pt Will Perform Toileting - Clothing Manipulation and hygiene: with min assist;with caregiver independent in assisting;sit to/from stand  OT Frequency: Min 3X/week   Barriers to D/C:            Co-evaluation              End of Session Equipment Utilized During Treatment: Gait belt Nurse Communication:  Mobility status  Activity Tolerance: Patient tolerated treatment well Patient left: in chair;with call bell/phone within reach;with chair alarm set   Time: 1020-1045 OT Time Calculation (min): 25 min Charges:  OT General Charges $OT Visit: 1 Procedure OT Evaluation $OT Eval Low Complexity: 1 Procedure OT Treatments $Self Care/Home Management : 8-22 mins G-Codes:    Payton Mccallum D 05/03/15, 12:31 PM

## 2015-04-21 NOTE — Progress Notes (Addendum)
[facility] PROGRESS NOTE  Kelly Conrad [id number] DOB: [birth year] DOA: 04/19/2015 PCP: Lottie Dawson, MD  Length of stay: 2   Assessment/Plan: Principal Problem:   Intertrochanteric fracture of left femur ([place]) Active Problems:   Essential hypertension   Dementia   History of positive PPD   Hearing aid worn   Obesity   Chronic asthma without complication   Hypothyroidism (acquired)   Fracture, intertrochanteric, left femur ([place])   Pertrochanteric fracture of left femur [facility])    Brief summary 82 year old female with a past medical history of hypertension, dyslipidemia, gastroesophageal reflux disease, asthma, dementia without behavioral disturbance, who presents to the ER after a fall. Patient lives with her friend called [name] who has taken care of her for 22 years. He provides most of the history. He states that the patient is fairly independent with her ambulation and does not use a walker or a cane. For the last 2 weeks she has had some shortness of breath with exertion, but no chest pain. He attributes it to sinus infection and chest congestion and has been treating her with Mucinex. Her chest x-ray is negative. Apparently the patient slipped and fell while she was trying to mop the floor and the floor happened to be wet. She was unable to get up to walk due to pain. She was brought in by EMS and was noted to have shortening and rotation of the left leg.  Assessment and plan  Intertrochanteric fracture of left femur [facility]) Patient followed by Rod Can, MD,  According to the revised [facility] cardiac risk index, patient is at low to moderate risk from a cardiac standpoint POA agreeable to proceed with surgery Patient status post surgery 2/8 drop in hemoglobin after surgery will monitor, continue Lovenox for DVT prophylaxis  Acute blood loss anemia Hemoglobin drop from 10.1-7.9, platelet count has dropped from 170-116 Continue Lovenox for  DVT prophylaxis for now If the patient's hemoglobin and platelets continue to drop would transfuse for hemoglobin less than 7.0 Would consider discontinuing Lovenox for platelets less than 100,000 and consider Coumadin for DVT prophylaxis   Acute kidney injury in the setting of chronic kidney disease stage III-baseline creatinine 1.4, presented with a creatinine of 1.83, currently 1.35 , discontinue IV fluids, likely secondary to dehydration,    Essential hypertension-controlled, will start patient continue prn hydralazine, blood pressure soft, continue to monitor  dementia-continue Aricept   History of positive PPD-This may be repeated if SNF placement Is needed  Hard of hearing-patient has hearing aids   Obesity- Body mass index is 31.95 kg/(m^2).   Chronic asthma without complication-chest x-ray negative, will start patient on when necessary Xopenex treatments   Hypothyroidism (acquired)-  TSH 2.32 and continue Synthroid   DVT prophylaxsis Lovenox  Code Status:      Code Status Orders     DO NOT RESUSCITATE    Start     Ordered    Family Communication: Discussed in detail with the patient, all imaging results, lab results explained to the patient   Disposition Plan:  Anticipated discharge 1-2 days, PT eval    Consultants:  Orthopedics  Procedures:  None  Antibiotics: Anti-infectives    Start     Dose/Rate Route Frequency Ordered Stop   04/20/15 2300  ceFAZolin (ANCEF) IVPB 2 g/50 mL premix     2 g 100 mL/hr over 30 Minutes Intravenous Every 6 hours 04/20/15 1919 04/21/15 0504   04/19/15 2037  [facility]  Hold]  ceFAZolin (ANCEF) IVPB 2 g/50 mL premix     (MAR Hold since 04/20/15 1604)   2 g 100 mL/hr over 30 Minutes Intravenous 30 min pre-op 04/19/15 2032 04/20/15 1655         HPI/Subjective: No active bleeding, patient appears to be comfortable, afebrile, blood pressure soft  Objective: Filed Vitals:   04/20/15 1915 04/20/15 1930 04/21/15 0501  04/21/15 0949  BP: 114/87 108/62 128/45 114/48  Pulse: 73 80 80 91  Temp: 97.7 F (36.5 C) 98.1 F (36.7 C) 98.6 F (37 C) 98.4 F (36.9 C)  TempSrc:   Oral Oral  Resp: 21 16 18 18   Height:      Weight:      SpO2: 100% 100% 100% 100%    Intake/Output Summary (Last 24 hours) at 04/21/15 1205 Last data filed at 04/21/15 [id number]  Gross per 24 hour  Intake 4303.33 ml  Output   1260 ml  Net 3043.33 ml    Exam:  General: No acute respiratory distress Lungs: Clear to auscultation bilaterally without wheezes or crackles Cardiovascular: Regular rate and rhythm without murmur gallop or rub normal S1 and S2 Abdomen: Nontender, nondistended, soft, bowel sounds positive, no rebound, no ascites, no appreciable mass Extremities: External rotation of the left hip,     Data Review   Micro Results Recent Results (from the past 240 hour(s))  Surgical pcr screen     Status: None   Collection Time: 04/20/15  6:23 AM  Result Value Ref Range Status   MRSA, PCR NEGATIVE NEGATIVE Final   Staphylococcus aureus NEGATIVE NEGATIVE Final    Comment:        The Xpert SA Assay (FDA approved for NASAL specimens in patients over 46 years of age), is one component of a comprehensive surveillance program.  Test performance has been validated by [facility] for patients greater than or equal to 43 year old. It is not intended to diagnose infection nor to guide or monitor treatment.     Radiology Reports Dg Chest 1 View  04/19/2015  CLINICAL DATA:  [place] this morning. Left hip pain. History of hypertension. EXAM: CHEST 1 VIEW COMPARISON:  09/24/2014 FINDINGS: Cardiac silhouette is mildly enlarged. There are no mediastinal or hilar masses or evidence of adenopathy. There is some central vascular prominence accentuated by low lung volumes. Linear scarring is noted in the right mid lung. No convincing pneumonia or pulmonary edema. No pleural effusion or pneumothorax. Bony thorax is demineralized but  grossly intact. IMPRESSION: No acute cardiopulmonary disease. Electronically Signed   By: Lajean Manes M.D.   On: 04/19/2015 15:12   Pelvis Portable  04/20/2015  CLINICAL DATA:  Status post left IM nail placement EXAM: PORTABLE PELVIS 1-2 VIEWS COMPARISON:  None. FINDINGS: There is a left intertrochanteric fracture transfixed with a left intramedullary nail and interlocking femoral neck screw. There is no other fracture or dislocation. IMPRESSION: Status post ORIF left intertrochanteric fracture. Electronically Signed   By: Kathreen Devoid   On: 04/20/2015 19:19   Dg C-arm 1-60 Min-no Report  04/20/2015  CLINICAL DATA: surgery C-ARM 1-60 MINUTES Fluoroscopy was utilized by the requesting physician.  No radiographic interpretation.   Dg Hip Operative Unilat With Pelvis Left  04/20/2015  CLINICAL DATA:  IM nail left hip EXAM: OPERATIVE LEFT HIP (WITH PELVIS IF PERFORMED) 4 VIEWS TECHNIQUE: Fluoroscopic spot image(s) were submitted for interpretation post-operatively. FLUOROSCOPY TIME:  1 minutes 50 seconds COMPARISON:  Left hip radiographs dated 04/19/2015 FINDINGS: Intraoperative  fluoroscopic images during IM nail with dynamic hip screw fixation of an intertrochanteric left hip fracture. Distal interlocking screw. Fracture fragments are in near anatomic alignment and position. IMPRESSION: Intraoperative fluoroscopic images during ORIF of a left hip fracture, as above. Electronically Signed   By: Julian Hy M.D.   On: 04/20/2015 18:22   Dg Hip Unilat With Pelvis 2-3 Views Left  04/19/2015  CLINICAL DATA:  Status post fall this morning with a left hip injury. Continued pain. Initial encounter. EXAM: DG HIP (WITH OR WITHOUT PELVIS) 2-3V LEFT COMPARISON:  None. FINDINGS: The patient has an acute left intertrochanteric fracture. The femoral head is located. No other acute bony or joint abnormality is seen. Lower lumbar spondylosis noted. IMPRESSION: Acute left intertrochanteric fracture. Electronically Signed    By: Inge Rise M.D.   On: 04/19/2015 15:11   Dg Femur 1v Left  04/19/2015  CLINICAL DATA:  [place] earlier this morning, LEFT hip pain, history hypertension, asthma, GERD, dementia EXAM: LEFT FEMUR 1 VIEW COMPARISON:  None FINDINGS: Osseous demineralization. Hip and knee joint alignments grossly normal for positioning. Displaced and comminuted intertrochanteric fracture proximal LEFT femur. No dislocation. Visualized LEFT pelvis intact. Mild atherosclerotic calcifications at the mid LEFT superficial femoral artery. IMPRESSION: Osseous demineralization with a comminuted displaced intertrochanteric fracture LEFT femur. Electronically Signed   By: Lavonia Dana M.D.   On: 04/19/2015 15:11   Dg Femur Min 2 Views Left  04/20/2015  CLINICAL DATA:  Proximal left femur fracture. EXAM: LEFT FEMUR 2 VIEWS COMPARISON:  April 19, 2015. FINDINGS: Status post intra medullary rod fixation of comminuted fracture of the intertrochanteric proximal left femur. Improved alignment of fracture components is noted. IMPRESSION: Status post intra medullary rod fixation of proximal left femoral fracture. Electronically Signed   By: Marijo Conception, M.D.   On: 04/20/2015 19:18     CBC  Recent Labs Lab 04/19/15 1415 04/20/15 0526 04/21/15 0448  WBC 13.7* 5.8 4.9  HGB 13.1 10.1* 7.9*  HCT 41.2 31.5* 24.3*  PLT 170 140* 116*  MCV 91.2 91.0 91.0  MCH 29.0 29.2 29.6  MCHC 31.8 32.1 32.5  RDW 13.9 14.2 14.2  LYMPHSABS 0.9  --   --   MONOABS 0.9  --   --   EOSABS 0.0  --   --   BASOSABS 0.0  --   --     Chemistries   Recent Labs Lab 04/19/15 1415 04/19/15 1611 04/20/15 0526 04/21/15 0448  NA 142  --  141 136  K 4.6  --  4.2 4.1  CL 107  --  108 106  CO2 25  --  24 25  GLUCOSE 149*  --  134* 127*  BUN 33*  --  34* 23*  CREATININE 1.83*  --  1.50* 1.35*  CALCIUM 8.7*  --  7.9* 7.4*  MG  --  2.0  --   --   AST  --   --  22 26  ALT  --   --  15 15  ALKPHOS  --   --  55 50  BILITOT  --   --  0.6 0.5    ------------------------------------------------------------------------------------------------------------------ estimated creatinine clearance is 30.3 mL/min (by C-G formula based on Cr of 1.35). ------------------------------------------------------------------------------------------------------------------ No results for input(s): HGBA1C in the last 72 hours. ------------------------------------------------------------------------------------------------------------------ No results for input(s): CHOL, HDL, LDLCALC, TRIG, CHOLHDL, LDLDIRECT in the last 72 hours. ------------------------------------------------------------------------------------------------------------------  Recent Labs  04/19/15 1611  TSH 2.320   ------------------------------------------------------------------------------------------------------------------ No results for  input(s): VITAMINB12, FOLATE, FERRITIN, TIBC, IRON, RETICCTPCT in the last 72 hours.  Coagulation profile  Recent Labs Lab 04/19/15 1415  INR 1.07    No results for input(s): DDIMER in the last 72 hours.  Cardiac Enzymes No results for input(s): CKMB, TROPONINI, MYOGLOBIN in the last 168 hours.  Invalid input(s): CK ------------------------------------------------------------------------------------------------------------------ Invalid input(s): POCBNP   CBG: No results for input(s): GLUCAP in the last 168 hours.     Studies: Dg Chest 1 View  04/19/2015  CLINICAL DATA:  [place] this morning. Left hip pain. History of hypertension. EXAM: CHEST 1 VIEW COMPARISON:  09/24/2014 FINDINGS: Cardiac silhouette is mildly enlarged. There are no mediastinal or hilar masses or evidence of adenopathy. There is some central vascular prominence accentuated by low lung volumes. Linear scarring is noted in the right mid lung. No convincing pneumonia or pulmonary edema. No pleural effusion or pneumothorax. Bony thorax is demineralized but grossly  intact. IMPRESSION: No acute cardiopulmonary disease. Electronically Signed   By: Lajean Manes M.D.   On: 04/19/2015 15:12   Pelvis Portable  04/20/2015  CLINICAL DATA:  Status post left IM nail placement EXAM: PORTABLE PELVIS 1-2 VIEWS COMPARISON:  None. FINDINGS: There is a left intertrochanteric fracture transfixed with a left intramedullary nail and interlocking femoral neck screw. There is no other fracture or dislocation. IMPRESSION: Status post ORIF left intertrochanteric fracture. Electronically Signed   By: Kathreen Devoid   On: 04/20/2015 19:19   Dg C-arm 1-60 Min-no Report  04/20/2015  CLINICAL DATA: surgery C-ARM 1-60 MINUTES Fluoroscopy was utilized by the requesting physician.  No radiographic interpretation.   Dg Hip Operative Unilat With Pelvis Left  04/20/2015  CLINICAL DATA:  IM nail left hip EXAM: OPERATIVE LEFT HIP (WITH PELVIS IF PERFORMED) 4 VIEWS TECHNIQUE: Fluoroscopic spot image(s) were submitted for interpretation post-operatively. FLUOROSCOPY TIME:  1 minutes 50 seconds COMPARISON:  Left hip radiographs dated 04/19/2015 FINDINGS: Intraoperative fluoroscopic images during IM nail with dynamic hip screw fixation of an intertrochanteric left hip fracture. Distal interlocking screw. Fracture fragments are in near anatomic alignment and position. IMPRESSION: Intraoperative fluoroscopic images during ORIF of a left hip fracture, as above. Electronically Signed   By: Julian Hy M.D.   On: 04/20/2015 18:22   Dg Hip Unilat With Pelvis 2-3 Views Left  04/19/2015  CLINICAL DATA:  Status post fall this morning with a left hip injury. Continued pain. Initial encounter. EXAM: DG HIP (WITH OR WITHOUT PELVIS) 2-3V LEFT COMPARISON:  None. FINDINGS: The patient has an acute left intertrochanteric fracture. The femoral head is located. No other acute bony or joint abnormality is seen. Lower lumbar spondylosis noted. IMPRESSION: Acute left intertrochanteric fracture. Electronically Signed   By:  Inge Rise M.D.   On: 04/19/2015 15:11   Dg Femur 1v Left  04/19/2015  CLINICAL DATA:  [place] earlier this morning, LEFT hip pain, history hypertension, asthma, GERD, dementia EXAM: LEFT FEMUR 1 VIEW COMPARISON:  None FINDINGS: Osseous demineralization. Hip and knee joint alignments grossly normal for positioning. Displaced and comminuted intertrochanteric fracture proximal LEFT femur. No dislocation. Visualized LEFT pelvis intact. Mild atherosclerotic calcifications at the mid LEFT superficial femoral artery. IMPRESSION: Osseous demineralization with a comminuted displaced intertrochanteric fracture LEFT femur. Electronically Signed   By: Lavonia Dana M.D.   On: 04/19/2015 15:11   Dg Femur Min 2 Views Left  04/20/2015  CLINICAL DATA:  Proximal left femur fracture. EXAM: LEFT FEMUR 2 VIEWS COMPARISON:  April 19, 2015. FINDINGS: Status post intra medullary rod  fixation of comminuted fracture of the intertrochanteric proximal left femur. Improved alignment of fracture components is noted. IMPRESSION: Status post intra medullary rod fixation of proximal left femoral fracture. Electronically Signed   By: Marijo Conception, M.D.   On: 04/20/2015 19:18      Lab Results  Component Value Date   HGBA1C 5.8 11/11/2014   HGBA1C 6.0* 06/29/2014   Lab Results  Component Value Date   LDLCALC 112* 03/02/2014   CREATININE 1.35* 04/21/2015       Scheduled Meds: . docusate sodium  100 mg Oral BID  . donepezil  10 mg Oral QHS  . enoxaparin (LOVENOX) injection  40 mg Subcutaneous Q24H  . hydrochlorothiazide  12.5 mg Oral Daily  . senna  1 tablet Oral BID  . simvastatin  40 mg Oral QHS  . sodium chloride flush  3 mL Intravenous Q12H   Continuous Infusions: . sodium chloride 100 mL/hr at 04/21/15 1043    Principal Problem:   Intertrochanteric fracture of left femur ([facility]) Active Problems:   Essential hypertension   Dementia   History of positive PPD   Hearing aid worn   Obesity   Chronic  asthma without complication   Hypothyroidism (acquired)   Fracture, intertrochanteric, left femur ([place])   Pertrochanteric fracture of left femur ([place])    Time spent: 45 minutes   [facility] Pager [phone]. If 7PM-7AM, please contact night-coverage at [email or web address], password [facility] 04/21/2015, 12:05 PM  LOS: 2 days

## 2015-04-21 NOTE — Evaluation (Signed)
Physical Therapy Evaluation Patient Details Name: Kelly Conrad MRN: [id number] DOB: 1931-12-16 Today's Date: 04/21/2015   History of Present Illness  82 year old female with a past medical history of hypertension, dyslipidemia, gastroesophageal reflux disease, asthma, dementia without behavioral disturbance and admitted after fall, s/p fixation of left pertrochanteric femur fracture  Clinical Impression  Patient is s/p above surgery resulting in functional limitations due to the deficits listed below (see PT Problem List).  Patient will benefit from skilled PT to increase their independence and safety with mobility to allow discharge to the venue listed below.   Pt requiring +2 assist (at least moderate) at this time for mobility.  Ambulation limited due to pain and dizziness with BP 90/67 mmHg once returned to sitting in recliner (RN notified).  Pt would benefit from ST-SNF prior to return home.     Follow Up Recommendations SNF;Supervision/Assistance - 24 hour    Equipment Recommendations  None recommended by PT    Recommendations for Other Services       Precautions / Restrictions Precautions Precautions: Fall Restrictions Other Position/Activity Restrictions: WBAT with RW      Mobility  Bed Mobility               General bed mobility comments: pt up in recliner on arrival  Transfers Overall transfer level: Needs assistance Equipment used: Rolling walker (2 wheeled) Transfers: Sit to/from Stand Sit to Stand: Mod assist;+2 physical assistance;+2 safety/equipment         General transfer comment: verbal cues for UE and LE positioning, assist to rise, steady and control descent  Ambulation/Gait Ambulation/Gait assistance: Mod assist;+2 physical assistance Ambulation Distance (Feet): 3 Feet Assistive device: Rolling walker (2 wheeled) Gait Pattern/deviations: Step-to pattern;Decreased stance time - left;Decreased weight shift to left;Antalgic     General Gait  Details: pt attempting to ambulate with decreased WBing on L LE and forefoot contact only, pt reported increased dizziness so pulled recliner behind pt to return to sitting and BP 90/67 mmHg (RN notified), SpO2 100% room air and HR 98bpm  Stairs            Wheelchair Mobility    Modified Rankin (Stroke Patients Only)       Balance                                             Pertinent Vitals/Pain Pain Assessment: Faces Faces Pain Scale: Hurts even more Pain Location: L hip with standing Pain Descriptors / Indicators: Sore;Grimacing Pain Intervention(s): Monitored during session;Repositioned (RN checked with pt on pain meds prior to arrival)    Home Living Family/patient expects to be discharged to:: Private residence Living Arrangements: Non-relatives/Friends Available Help at Discharge: Friend(s);Available PRN/intermittently Type of Home: Mobile home Home Access: Stairs to enter;Ramped entrance   Entrance Stairs-Number of Steps: ramp in back Home Layout: One level Home Equipment: Walker - 2 wheels;Shower seat      Prior Function Level of Independence: Independent               Hand Dominance        Extremity/Trunk Assessment               Lower Extremity Assessment: Generalized weakness;LLE deficits/detail   LLE Deficits / Details: functional hip weakness observed, required assist for LE ROM, able to perform ankle pumps     Communication   Communication: [facility]  Cognition Arousal/Alertness: Awake/alert Behavior During Therapy: WFL for tasks assessed/performed Overall Cognitive Status: Within Functional Limits for tasks assessed                      General Comments      Exercises Total Joint Exercises Ankle Circles/Pumps: AROM;Both;10 reps Quad Sets: AROM;Left;10 reps Short Arc Quad: AROM;Left;10 reps Heel Slides: AAROM;Left;10 reps Hip ABduction/ADduction: AAROM;Left;10 reps      Assessment/Plan    PT  Assessment Patient needs continued PT services  PT Diagnosis Difficulty walking;Acute pain   PT Problem List Decreased strength;Decreased range of motion;Decreased mobility;Decreased balance;Decreased knowledge of use of DME;Pain  PT Treatment Interventions DME instruction;Gait training;Functional mobility training;Patient/family education;Wheelchair mobility training;Therapeutic activities;Therapeutic exercise   PT Goals (Current goals can be found in the Care Plan section) Acute Rehab PT Goals PT Goal Formulation: With patient Time For Goal Achievement: 04/28/15 Potential to Achieve Goals: Fair    Frequency Min 3X/week   Barriers to discharge        Co-evaluation               End of Session Equipment Utilized During Treatment: Gait belt;Oxygen (reapplied O2 [place] upon return to recliner) Activity Tolerance: Treatment limited secondary to medical complications (Comment) (limited by decreased BP) Patient left: with call bell/phone within reach;in chair;with chair alarm set Nurse Communication: Mobility status (low BP)         Time: [id number] PT Time Calculation (min) (ACUTE ONLY): 17 min   Charges:   PT Evaluation $PT Eval Moderate Complexity: 1 Procedure     PT G Codes:        Kairos Panetta,KATHrine E 04/21/2015, 3:26 PM Carmelia Bake, PT, DPT 04/21/2015 Pager: [phone]

## 2015-04-22 LAB — CBC
HEMATOCRIT: 21.4 % — AB (ref 36.0–46.0)
HEMOGLOBIN: 7 g/dL — AB (ref 12.0–15.0)
MCH: 29.5 pg (ref 26.0–34.0)
MCHC: 32.7 g/dL (ref 30.0–36.0)
MCV: 90.3 fL (ref 78.0–100.0)
Platelets: 114 10*3/uL — ABNORMAL LOW (ref 150–400)
RBC: 2.37 MIL/uL — ABNORMAL LOW (ref 3.87–5.11)
RDW: 14.2 % (ref 11.5–15.5)
WBC: 5.7 10*3/uL (ref 4.0–10.5)

## 2015-04-22 LAB — COMPREHENSIVE METABOLIC PANEL
ALBUMIN: 2.3 g/dL — AB (ref 3.5–5.0)
ALT: 8 U/L — ABNORMAL LOW (ref 14–54)
AST: 32 U/L (ref 15–41)
Alkaline Phosphatase: 47 U/L (ref 38–126)
Anion gap: 7 (ref 5–15)
BILIRUBIN TOTAL: 0.8 mg/dL (ref 0.3–1.2)
BUN: 21 mg/dL — AB (ref 6–20)
CO2: 24 mmol/L (ref 22–32)
Calcium: 7.8 mg/dL — ABNORMAL LOW (ref 8.9–10.3)
Chloride: 109 mmol/L (ref 101–111)
Creatinine, Ser: 1.3 mg/dL — ABNORMAL HIGH (ref 0.44–1.00)
GFR calc Af Amer: 43 mL/min — ABNORMAL LOW (ref >60)
GFR calc non Af Amer: 37 mL/min — ABNORMAL LOW (ref >60)
GLUCOSE: 121 mg/dL — AB (ref 65–99)
POTASSIUM: 3.6 mmol/L (ref 3.5–5.1)
Sodium: 140 mmol/L (ref 135–145)
TOTAL PROTEIN: 4.8 g/dL — AB (ref 6.5–8.1)

## 2015-04-22 LAB — PREPARE RBC (CROSSMATCH)

## 2015-04-22 MED ORDER — SODIUM CHLORIDE 0.9 % IV SOLN
Freq: Once | INTRAVENOUS | Status: AC
  Administered 2015-04-22: 11:00:00 via INTRAVENOUS

## 2015-04-22 NOTE — Progress Notes (Signed)
[facility] PROGRESS NOTE  Kelly Conrad [id number] DOB: 08/12/1931 DOA: 04/19/2015 PCP: Lottie Dawson, MD  Length of stay: 3   Assessment/Plan: Principal Problem:   Intertrochanteric fracture of left femur ([place]) Active Problems:   Essential hypertension   Dementia   History of positive PPD   Hearing aid worn   Obesity   Chronic asthma without complication   Hypothyroidism (acquired)   Fracture, intertrochanteric, left femur ([place])   Pertrochanteric fracture of left femur [facility])    Brief summary 82 year old female with a past medical history of hypertension, dyslipidemia, gastroesophageal reflux disease, asthma, dementia without behavioral disturbance, who presents to the ER after a fall. Patient lives with her friend called [name] who has taken care of her for 22 years. He provides most of the history. He states that the patient is fairly independent with her ambulation and does not use a walker or a cane. For the last 2 weeks she has had some shortness of breath with exertion, but no chest pain. He attributes it to sinus infection and chest congestion and has been treating her with Mucinex. Her chest x-ray is negative. Apparently the patient slipped and fell while she was trying to mop the floor and the floor happened to be wet. She was unable to get up to walk due to pain. She was brought in by EMS and was noted to have shortening and rotation of the left leg.  Assessment and plan  Intertrochanteric fracture of left femur [facility]) Patient followed by Rod Can, MD,  According to the revised [facility] cardiac risk index, patient is at low to moderate risk from a cardiac standpoint POA agreed to proceed with surgery Patient status post surgery 2/8 drop in hemoglobin after surgery to 7.0, transfuse 2 units of packed red blood cells today, continue Lovenox for DVT prophylaxis, no active bleeding  Acute blood loss anemia Hemoglobin drop from  10.1>-7.0, platelet count has dropped from 170-116>114 Continue Lovenox for DVT prophylaxis for now Transfuse 2 units of packed red blood cells Would consider discontinuing Lovenox for platelets less than 100,000 and consider Coumadin for DVT prophylaxis   Acute kidney injury in the setting of chronic kidney disease stage III-baseline creatinine 1.4, presented with a creatinine of 1.83, currently 1.35 , discontinue IV fluids, likely secondary to dehydration,    Essential hypertension-controlled, will continue prn hydralazine, blood pressure soft, continue to monitor  dementia-continue Aricept   History of positive PPD-This may be repeated if SNF placement Is needed  Hard of hearing-patient has hearing aids   Obesity- Body mass index is 31.95 kg/(m^2).   Chronic asthma without complication-chest x-ray negative, will start patient on when necessary Xopenex treatments   Hypothyroidism (acquired)-  TSH 2.32 and continue Synthroid   DVT prophylaxsis Lovenox  Code Status:      Code Status Orders     DO NOT RESUSCITATE    Start     Ordered    Family Communication: Discussed in detail with the patient, all imaging results, lab results explained to the patient   Disposition Plan:  Blood transfusion today, anticipate discharge to SNF tomorrow if hemoglobin stable and platelets are stable    Consultants:  Orthopedics  Procedures:  None  Antibiotics: Anti-infectives    Start     Dose/Rate Route Frequency Ordered Stop   04/20/15 2300  ceFAZolin (ANCEF) IVPB 2 g/50 mL premix     2 g 100 mL/hr over 30 Minutes Intravenous Every 6  hours 04/20/15 1919 04/21/15 0504   04/19/15 2037  [MAR Hold]  ceFAZolin (ANCEF) IVPB 2 g/50 mL premix     (MAR Hold since 04/20/15 1604)   2 g 100 mL/hr over 30 Minutes Intravenous 30 min pre-op 04/19/15 2032 04/20/15 1655         HPI/Subjective: No active bleeding, patient appears to be comfortable, denies chest pain, shortness of  breath, blood pressure soft, low-grade fever of 99.5, no cough no urinary symptoms  Objective: Filed Vitals:   04/22/15 0218 04/22/15 0513 04/22/15 1100 04/22/15 1116  BP: 108/43 107/44 104/53 105/44  Pulse: 98 96 84 88  Temp: 99.5 F (37.5 C) 99.6 F (37.6 C) 98.3 F (36.8 C) 98.5 F (36.9 C)  TempSrc: Oral Oral Oral Oral  Resp: 18 18 18 18   Height:      Weight:      SpO2: 100% 95% 97% 97%    Intake/Output Summary (Last 24 hours) at 04/22/15 1159 Last data filed at 04/22/15 1116  Gross per 24 hour  Intake 2671.67 ml  Output    650 ml  Net 2021.67 ml    Exam:  General: No acute respiratory distress Lungs: Clear to auscultation bilaterally without wheezes or crackles Cardiovascular: Regular rate and rhythm without murmur gallop or rub normal S1 and S2 Abdomen: Nontender, nondistended, soft, bowel sounds positive, no rebound, no ascites, no appreciable mass Extremities: External rotation of the left hip,     Data Review   Micro Results Recent Results (from the past 240 hour(s))  Surgical pcr screen     Status: None   Collection Time: 04/20/15  6:23 AM  Result Value Ref Range Status   MRSA, PCR NEGATIVE NEGATIVE Final   Staphylococcus aureus NEGATIVE NEGATIVE Final    Comment:        The Xpert SA Assay (FDA approved for NASAL specimens in patients over 25 years of age), is one component of a comprehensive surveillance program.  Test performance has been validated by [facility] for patients greater than or equal to 14 year old. It is not intended to diagnose infection nor to guide or monitor treatment.     Radiology Reports Dg Chest 1 View  04/19/2015  CLINICAL DATA:  [place] this morning. Left hip pain. History of hypertension. EXAM: CHEST 1 VIEW COMPARISON:  09/24/2014 FINDINGS: Cardiac silhouette is mildly enlarged. There are no mediastinal or hilar masses or evidence of adenopathy. There is some central vascular prominence accentuated by low lung volumes.  Linear scarring is noted in the right mid lung. No convincing pneumonia or pulmonary edema. No pleural effusion or pneumothorax. Bony thorax is demineralized but grossly intact. IMPRESSION: No acute cardiopulmonary disease. Electronically Signed   By: Lajean Manes M.D.   On: 04/19/2015 15:12   Pelvis Portable  04/20/2015  CLINICAL DATA:  Status post left IM nail placement EXAM: PORTABLE PELVIS 1-2 VIEWS COMPARISON:  None. FINDINGS: There is a left intertrochanteric fracture transfixed with a left intramedullary nail and interlocking femoral neck screw. There is no other fracture or dislocation. IMPRESSION: Status post ORIF left intertrochanteric fracture. Electronically Signed   By: Kathreen Devoid   On: 04/20/2015 19:19   Dg C-arm 1-60 Min-no Report  04/20/2015  CLINICAL DATA: surgery C-ARM 1-60 MINUTES Fluoroscopy was utilized by the requesting physician.  No radiographic interpretation.   Dg Hip Operative Unilat With Pelvis Left  04/20/2015  CLINICAL DATA:  IM nail left hip EXAM: OPERATIVE LEFT HIP (WITH PELVIS IF PERFORMED) 4  VIEWS TECHNIQUE: Fluoroscopic spot image(s) were submitted for interpretation post-operatively. FLUOROSCOPY TIME:  1 minutes 50 seconds COMPARISON:  Left hip radiographs dated 04/19/2015 FINDINGS: Intraoperative fluoroscopic images during IM nail with dynamic hip screw fixation of an intertrochanteric left hip fracture. Distal interlocking screw. Fracture fragments are in near anatomic alignment and position. IMPRESSION: Intraoperative fluoroscopic images during ORIF of a left hip fracture, as above. Electronically Signed   By: Julian Hy M.D.   On: 04/20/2015 18:22   Dg Hip Unilat With Pelvis 2-3 Views Left  04/19/2015  CLINICAL DATA:  Status post fall this morning with a left hip injury. Continued pain. Initial encounter. EXAM: DG HIP (WITH OR WITHOUT PELVIS) 2-3V LEFT COMPARISON:  None. FINDINGS: The patient has an acute left intertrochanteric fracture. The femoral head is  located. No other acute bony or joint abnormality is seen. Lower lumbar spondylosis noted. IMPRESSION: Acute left intertrochanteric fracture. Electronically Signed   By: Inge Rise M.D.   On: 04/19/2015 15:11   Dg Femur 1v Left  04/19/2015  CLINICAL DATA:  [place] earlier this morning, LEFT hip pain, history hypertension, asthma, GERD, dementia EXAM: LEFT FEMUR 1 VIEW COMPARISON:  None FINDINGS: Osseous demineralization. Hip and knee joint alignments grossly normal for positioning. Displaced and comminuted intertrochanteric fracture proximal LEFT femur. No dislocation. Visualized LEFT pelvis intact. Mild atherosclerotic calcifications at the mid LEFT superficial femoral artery. IMPRESSION: Osseous demineralization with a comminuted displaced intertrochanteric fracture LEFT femur. Electronically Signed   By: Lavonia Dana M.D.   On: 04/19/2015 15:11   Dg Femur Min 2 Views Left  04/20/2015  CLINICAL DATA:  Proximal left femur fracture. EXAM: LEFT FEMUR 2 VIEWS COMPARISON:  April 19, 2015. FINDINGS: Status post intra medullary rod fixation of comminuted fracture of the intertrochanteric proximal left femur. Improved alignment of fracture components is noted. IMPRESSION: Status post intra medullary rod fixation of proximal left femoral fracture. Electronically Signed   By: Marijo Conception, M.D.   On: 04/20/2015 19:18     CBC  Recent Labs Lab 04/19/15 1415 04/20/15 0526 04/21/15 0448 04/22/15 0453  WBC 13.7* 5.8 4.9 5.7  HGB 13.1 10.1* 7.9* 7.0*  HCT 41.2 31.5* 24.3* 21.4*  PLT 170 140* 116* 114*  MCV 91.2 91.0 91.0 90.3  MCH 29.0 29.2 29.6 29.5  MCHC 31.8 32.1 32.5 32.7  RDW 13.9 14.2 14.2 14.2  LYMPHSABS 0.9  --   --   --   MONOABS 0.9  --   --   --   EOSABS 0.0  --   --   --   BASOSABS 0.0  --   --   --     Chemistries   Recent Labs Lab 04/19/15 1415 04/19/15 1611 04/20/15 0526 04/21/15 0448 04/22/15 0453  NA 142  --  141 136 140  K 4.6  --  4.2 4.1 3.6  CL 107  --  108 106  109  CO2 25  --  24 25 24   GLUCOSE 149*  --  134* 127* 121*  BUN 33*  --  34* 23* 21*  CREATININE 1.83*  --  1.50* 1.35* 1.30*  CALCIUM 8.7*  --  7.9* 7.4* 7.8*  MG  --  2.0  --   --   --   AST  --   --  22 26 32  ALT  --   --  15 15 8*  ALKPHOS  --   --  55 50 47  BILITOT  --   --  0.6 0.5 0.8   ------------------------------------------------------------------------------------------------------------------ estimated creatinine clearance is 31.4 mL/min (by C-G formula based on Cr of 1.3). ------------------------------------------------------------------------------------------------------------------ No results for input(s): HGBA1C in the last 72 hours. ------------------------------------------------------------------------------------------------------------------ No results for input(s): CHOL, HDL, LDLCALC, TRIG, CHOLHDL, LDLDIRECT in the last 72 hours. ------------------------------------------------------------------------------------------------------------------  Recent Labs  04/19/15 1611  TSH 2.320   ------------------------------------------------------------------------------------------------------------------ No results for input(s): VITAMINB12, FOLATE, FERRITIN, TIBC, IRON, RETICCTPCT in the last 72 hours.  Coagulation profile  Recent Labs Lab 04/19/15 1415  INR 1.07    No results for input(s): DDIMER in the last 72 hours.  Cardiac Enzymes No results for input(s): CKMB, TROPONINI, MYOGLOBIN in the last 168 hours.  Invalid input(s): CK ------------------------------------------------------------------------------------------------------------------ Invalid input(s): POCBNP   CBG: No results for input(s): GLUCAP in the last 168 hours.     Studies: Pelvis Portable  04/20/2015  CLINICAL DATA:  Status post left IM nail placement EXAM: PORTABLE PELVIS 1-2 VIEWS COMPARISON:  None. FINDINGS: There is a left intertrochanteric fracture transfixed with a  left intramedullary nail and interlocking femoral neck screw. There is no other fracture or dislocation. IMPRESSION: Status post ORIF left intertrochanteric fracture. Electronically Signed   By: Kathreen Devoid   On: 04/20/2015 19:19   Dg C-arm 1-60 Min-no Report  04/20/2015  CLINICAL DATA: surgery C-ARM 1-60 MINUTES Fluoroscopy was utilized by the requesting physician.  No radiographic interpretation.   Dg Hip Operative Unilat With Pelvis Left  04/20/2015  CLINICAL DATA:  IM nail left hip EXAM: OPERATIVE LEFT HIP (WITH PELVIS IF PERFORMED) 4 VIEWS TECHNIQUE: Fluoroscopic spot image(s) were submitted for interpretation post-operatively. FLUOROSCOPY TIME:  1 minutes 50 seconds COMPARISON:  Left hip radiographs dated 04/19/2015 FINDINGS: Intraoperative fluoroscopic images during IM nail with dynamic hip screw fixation of an intertrochanteric left hip fracture. Distal interlocking screw. Fracture fragments are in near anatomic alignment and position. IMPRESSION: Intraoperative fluoroscopic images during ORIF of a left hip fracture, as above. Electronically Signed   By: Julian Hy M.D.   On: 04/20/2015 18:22   Dg Femur Min 2 Views Left  04/20/2015  CLINICAL DATA:  Proximal left femur fracture. EXAM: LEFT FEMUR 2 VIEWS COMPARISON:  April 19, 2015. FINDINGS: Status post intra medullary rod fixation of comminuted fracture of the intertrochanteric proximal left femur. Improved alignment of fracture components is noted. IMPRESSION: Status post intra medullary rod fixation of proximal left femoral fracture. Electronically Signed   By: Marijo Conception, M.D.   On: 04/20/2015 19:18      Lab Results  Component Value Date   HGBA1C 5.8 11/11/2014   HGBA1C 6.0* 06/29/2014   Lab Results  Component Value Date   LDLCALC 112* 03/02/2014   CREATININE 1.30* 04/22/2015       Scheduled Meds: . docusate sodium  100 mg Oral BID  . donepezil  10 mg Oral QHS  . enoxaparin (LOVENOX) injection  40 mg Subcutaneous  Q24H  . hydrochlorothiazide  12.5 mg Oral Daily  . senna  1 tablet Oral BID  . simvastatin  40 mg Oral QHS  . sodium chloride flush  3 mL Intravenous Q12H   Continuous Infusions: . sodium chloride 100 mL/hr at 04/22/15 [id number]    Principal Problem:   Intertrochanteric fracture of left femur ([facility]) Active Problems:   Essential hypertension   Dementia   History of positive PPD   Hearing aid worn   Obesity   Chronic asthma without complication   Hypothyroidism (acquired)   Fracture, intertrochanteric, left femur ([facility])   Pertrochanteric fracture  of left femur ([place])    Time spent: 45 minutes   [facility] Pager [phone]. If 7PM-7AM, please contact night-coverage at [email or web address], password [facility] 04/22/2015, 11:59 AM  LOS: 3 days

## 2015-04-22 NOTE — Progress Notes (Signed)
Resumed care of patient. Agree with previous assessment. Will continue to monitor. 

## 2015-04-22 NOTE — Care Management Important Message (Signed)
Important Message  Patient Details  Name: Kelly Conrad MRN: [id number] Date of Birth: Feb 28, 1932   Medicare Important Message Given:  Yes    Camillo Flaming 04/22/2015, 11:31 AMImportant Message  Patient Details  Name: Kelly Conrad MRN: [id number] Date of Birth: 1931-05-23   Medicare Important Message Given:  Yes    Camillo Flaming 04/22/2015, 11:31 AM

## 2015-04-22 NOTE — Progress Notes (Signed)
OT Cancellation Note  Patient Details Name: Kelly Conrad MRN: [id number] DOB: 28-Nov-1931   Cancelled Treatment:    Reason Eval/Treat Not Completed: Medical issues which prohibited therapy -- Patient getting unit of PRBCs at this time due to low H/H. Note d/c plan is for SNF rehab. Will continue OT per plan of care.  [name] A 04/22/2015, 11:14 AM

## 2015-04-22 NOTE — Progress Notes (Signed)
   Subjective:  Patient reports pain as mild to moderate.  No c/o. Hospitalist has ordered PRBCs.  Objective:   VITALS:   Filed Vitals:   04/21/15 1500 04/21/15 2150 04/22/15 0218 04/22/15 0513  BP: 115/59 116/37 108/43 107/44  Pulse: 89 97 98 96  Temp: 98.4 F (36.9 C) 98.5 F (36.9 C) 99.5 F (37.5 C) 99.6 F (37.6 C)  TempSrc: Oral Oral Oral Oral  Resp: 18 18 18 18   Height:      Weight:      SpO2: 100% 97% 100% 95%    ABD soft Sensation intact distally Intact pulses distally Dorsiflexion/Plantar flexion intact Incision: dressing C/D/I Compartment soft   Lab Results  Component Value Date   WBC 5.7 04/22/2015   HGB 7.0* 04/22/2015   HCT 21.4* 04/22/2015   MCV 90.3 04/22/2015   PLT 114* 04/22/2015   BMET    Component Value Date/Time   NA 140 04/22/2015 0453   K 3.6 04/22/2015 0453   CL 109 04/22/2015 0453   CO2 24 04/22/2015 0453   GLUCOSE 121* 04/22/2015 0453   BUN 21* 04/22/2015 0453   CREATININE 1.30* 04/22/2015 0453   CALCIUM 7.8* 04/22/2015 0453   GFRNONAA 37* 04/22/2015 0453   GFRAA 43* 04/22/2015 0453     Assessment/Plan: 2 Days Post-Op   Principal Problem:   Intertrochanteric fracture of left femur ([facility]) Active Problems:   Essential hypertension   Dementia   History of positive PPD   Hearing aid worn   Obesity   Chronic asthma without complication   Hypothyroidism (acquired)   Fracture, intertrochanteric, left femur ([facility])   Pertrochanteric fracture of left femur ([facility])   WBAT with walker DVT ppx: lovenox x30 days, SCDs PO pain control PT/OT Dispo: PT rec SNF    Louise Victory, Horald Pollen 04/22/2015, 10:20 AM   Rod Can, MD Cell [phone]

## 2015-04-22 NOTE — Clinical Social Work Placement (Signed)
CSW confirmed with Narda Rutherford at [facility] that they would be able to take patient over the weekend if ready.   Please call weekend CSW (ph#: [phone]) to facilitate discharge.      Raynaldo Opitz, [facility] Clinical Social Worker cell #: [phone]   CLINICAL SOCIAL WORK PLACEMENT  NOTE  Date:  04/22/2015  Patient Details  Name: Kelly Conrad MRN: [id number] Date of Birth: [birth year]  Clinical Social Work is seeking post-discharge placement for this patient at the [place] level of care (*CSW will initial, date and re-position this form in  chart as items are completed):  Yes   Patient/family provided with [place] Work Department's list of facilities offering this level of care within the geographic area requested by the patient (or if unable, by the patient's family).  Yes   Patient/family informed of their freedom to choose among providers that offer the needed level of care, that participate in Medicare, Medicaid or managed care program needed by the patient, have an available bed and are willing to accept the patient.  Yes   Patient/family informed of [facility]'s ownership interest in [facility] and [facility], as well as of the fact that they are under no obligation to receive care at these facilities.  PASRR submitted to EDS on 04/21/15     PASRR number received on 04/21/15     Existing PASRR number confirmed on       FL2 transmitted to all facilities in geographic area requested by pt/family on 04/21/15     FL2 transmitted to all facilities within larger geographic area on       Patient informed that his/her managed care company has contracts with or will negotiate with certain facilities, including the following:        Yes   Patient/family informed of bed offers received.  Patient chooses bed at [facility]     Physician recommends and patient chooses bed at      Patient to  be transferred to [facility] on  .  Patient to be transferred to facility by       Patient family notified on   of transfer.  Name of family member notified:        PHYSICIAN       Additional Comment:    _______________________________________________ Standley Brooking, LCSW 04/22/2015, 2:00 PM

## 2015-04-22 NOTE — Progress Notes (Signed)
Resumed care of patient at this time. Report received from C. Sullivan, RN. Will continue to monitor pt closely and carry out plan of care. Asaro, Yomaira Solar I   

## 2015-04-23 ENCOUNTER — Inpatient Hospital Stay (HOSPITAL_COMMUNITY): Payer: Medicare Other

## 2015-04-23 DIAGNOSIS — E039 Hypothyroidism, unspecified: Secondary | ICD-10-CM | POA: Diagnosis not present

## 2015-04-23 DIAGNOSIS — M6281 Muscle weakness (generalized): Secondary | ICD-10-CM | POA: Diagnosis not present

## 2015-04-23 DIAGNOSIS — R259 Unspecified abnormal involuntary movements: Secondary | ICD-10-CM | POA: Diagnosis not present

## 2015-04-23 DIAGNOSIS — Z5181 Encounter for therapeutic drug level monitoring: Secondary | ICD-10-CM | POA: Diagnosis not present

## 2015-04-23 DIAGNOSIS — R05 Cough: Secondary | ICD-10-CM | POA: Diagnosis not present

## 2015-04-23 DIAGNOSIS — R2681 Unsteadiness on feet: Secondary | ICD-10-CM | POA: Diagnosis not present

## 2015-04-23 DIAGNOSIS — R509 Fever, unspecified: Secondary | ICD-10-CM | POA: Diagnosis not present

## 2015-04-23 DIAGNOSIS — S72142A Displaced intertrochanteric fracture of left femur, initial encounter for closed fracture: Secondary | ICD-10-CM | POA: Diagnosis not present

## 2015-04-23 DIAGNOSIS — I10 Essential (primary) hypertension: Secondary | ICD-10-CM | POA: Diagnosis not present

## 2015-04-23 DIAGNOSIS — Z4789 Encounter for other orthopedic aftercare: Secondary | ICD-10-CM | POA: Diagnosis not present

## 2015-04-23 DIAGNOSIS — S7292XA Unspecified fracture of left femur, initial encounter for closed fracture: Secondary | ICD-10-CM | POA: Diagnosis not present

## 2015-04-23 DIAGNOSIS — J45901 Unspecified asthma with (acute) exacerbation: Secondary | ICD-10-CM | POA: Diagnosis not present

## 2015-04-23 DIAGNOSIS — N179 Acute kidney failure, unspecified: Secondary | ICD-10-CM | POA: Diagnosis not present

## 2015-04-23 DIAGNOSIS — S72002A Fracture of unspecified part of neck of left femur, initial encounter for closed fracture: Secondary | ICD-10-CM | POA: Diagnosis not present

## 2015-04-23 DIAGNOSIS — F0391 Unspecified dementia with behavioral disturbance: Secondary | ICD-10-CM | POA: Diagnosis not present

## 2015-04-23 DIAGNOSIS — S72145S Nondisplaced intertrochanteric fracture of left femur, sequela: Secondary | ICD-10-CM | POA: Diagnosis not present

## 2015-04-23 DIAGNOSIS — D62 Acute posthemorrhagic anemia: Secondary | ICD-10-CM | POA: Diagnosis not present

## 2015-04-23 DIAGNOSIS — R278 Other lack of coordination: Secondary | ICD-10-CM | POA: Diagnosis not present

## 2015-04-23 DIAGNOSIS — J45909 Unspecified asthma, uncomplicated: Secondary | ICD-10-CM | POA: Diagnosis not present

## 2015-04-23 LAB — URINALYSIS, ROUTINE W REFLEX MICROSCOPIC
BILIRUBIN URINE: NEGATIVE
Glucose, UA: NEGATIVE mg/dL
Ketones, ur: NEGATIVE mg/dL
Leukocytes, UA: NEGATIVE
NITRITE: NEGATIVE
PH: 5.5 (ref 5.0–8.0)
Protein, ur: NEGATIVE mg/dL
SPECIFIC GRAVITY, URINE: 1.014 (ref 1.005–1.030)

## 2015-04-23 LAB — CBC
HCT: 24.5 % — ABNORMAL LOW (ref 36.0–46.0)
Hemoglobin: 8 g/dL — ABNORMAL LOW (ref 12.0–15.0)
MCH: 29.5 pg (ref 26.0–34.0)
MCHC: 32.7 g/dL (ref 30.0–36.0)
MCV: 90.4 fL (ref 78.0–100.0)
PLATELETS: 147 10*3/uL — AB (ref 150–400)
RBC: 2.71 MIL/uL — ABNORMAL LOW (ref 3.87–5.11)
RDW: 14.2 % (ref 11.5–15.5)
WBC: 6.5 10*3/uL (ref 4.0–10.5)

## 2015-04-23 LAB — COMPREHENSIVE METABOLIC PANEL
ALBUMIN: 2.2 g/dL — AB (ref 3.5–5.0)
ALT: 9 U/L — ABNORMAL LOW (ref 14–54)
AST: 28 U/L (ref 15–41)
Alkaline Phosphatase: 45 U/L (ref 38–126)
Anion gap: 7 (ref 5–15)
BUN: 19 mg/dL (ref 6–20)
CHLORIDE: 107 mmol/L (ref 101–111)
CO2: 25 mmol/L (ref 22–32)
Calcium: 7.7 mg/dL — ABNORMAL LOW (ref 8.9–10.3)
Creatinine, Ser: 1.23 mg/dL — ABNORMAL HIGH (ref 0.44–1.00)
GFR calc Af Amer: 46 mL/min — ABNORMAL LOW (ref >60)
GFR calc non Af Amer: 40 mL/min — ABNORMAL LOW (ref >60)
GLUCOSE: 117 mg/dL — AB (ref 65–99)
POTASSIUM: 3.5 mmol/L (ref 3.5–5.1)
Sodium: 139 mmol/L (ref 135–145)
Total Bilirubin: 0.6 mg/dL (ref 0.3–1.2)
Total Protein: 5 g/dL — ABNORMAL LOW (ref 6.5–8.1)

## 2015-04-23 LAB — URINE MICROSCOPIC-ADD ON
RBC / HPF: NONE SEEN RBC/hpf (ref 0–5)
WBC UA: NONE SEEN WBC/hpf (ref 0–5)

## 2015-04-23 MED ORDER — DOCUSATE SODIUM 100 MG PO CAPS
100.0000 mg | ORAL_CAPSULE | Freq: Two times a day (BID) | ORAL | Status: DC
Start: 2015-04-23 — End: 2015-08-17

## 2015-04-23 MED ORDER — SENNA 8.6 MG PO TABS: 1.0000 | ORAL_TABLET | Freq: Two times a day (BID) | ORAL | Status: DC

## 2015-04-23 MED ORDER — HYDROCODONE-ACETAMINOPHEN 5-325 MG PO TABS: 1.0000 | ORAL_TABLET | Freq: Four times a day (QID) | ORAL | Status: DC | PRN

## 2015-04-23 MED ORDER — ENOXAPARIN SODIUM 40 MG/0.4ML ~~LOC~~ SOLN: 40.0000 mg | SUBCUTANEOUS | Status: DC

## 2015-04-23 MED ORDER — LEVALBUTEROL HCL 0.63 MG/3ML IN NEBU: 0.6300 mg | INHALATION_SOLUTION | Freq: Four times a day (QID) | RESPIRATORY_TRACT | Status: DC | PRN

## 2015-04-23 NOTE — Care Management Note (Signed)
Case Management Note  Patient Details  Name: Kelly Conrad MRN: [id number] Date of Birth: 02-13-1932  Subjective/Objective:    Left Intramedullary Nail Femoral                 Action/Plan: Scheduled dc to SNF, CSW arranged SNF placement.   Expected Discharge Date: 04/23/2015             Expected Discharge Plan:  Skilled Nursing Facility  In-House Referral:  Clinical Social Work  Discharge planning Services  CM Consult  Post Acute Care Choice:  NA Choice offered to:  NA  DME Arranged:  N/A DME Agency:  NA  HH Arranged:  NA HH Agency:  NA  Status of Service:  Completed, signed off  Medicare Important Message Given:  Yes Date Medicare IM Given:    Medicare IM give by:    Date Additional Medicare IM Given:    Additional Medicare Important Message give by:     If discussed at [place] of Stay Meetings, dates discussed:    Additional Comments:  Erenest Rasher, RN 04/23/2015, 1:07 PM

## 2015-04-23 NOTE — Progress Notes (Signed)
IVs and telemetry removed for pending discharge to SNF, transport per EMS. Caregiver aware and going to facility to finalize paperwork.

## 2015-04-23 NOTE — Progress Notes (Signed)
Patient leaving unit per EMS transport

## 2015-04-23 NOTE — Progress Notes (Signed)
Subjective: 3 Days Post-Op Procedure(s) (LRB): INTRAMEDULLARY (IM) NAIL FEMORAL (Left) Patient reports pain as mild wand well controlled.  T[place]ertaing PO's. Denies SOB, CP or calf pain. Progressing with PT. Son at bedside. Plan to D/c to SNF when ready.  Objective: Vital signs in last 24 hours: Temp:  [98.3 F (36.8 C)-100 F (37.8 C)] 99 F (37.2 C) (02/11 0418) Pulse Rate:  [67-92] 89 (02/11 0418) Resp:  [18] 18 (02/11 0418) BP: (104-125)/(44-80) 110/80 mmHg (02/11 0418) SpO2:  [95 %-100 %] 100 % (02/11 0418)  Intake/Output from previous day: 02/10 0701 - 02/11 0700 In: 2882 [P.O.:240; I.V.:2350; Blood:292] Out: 500 [Urine:500] Intake/Output this shift:     Recent Labs  04/21/15 0448 04/22/15 0453 04/23/15 0511  HGB 7.9* 7.0* 8.0*    Recent Labs  04/22/15 0453 04/23/15 0511  WBC 5.7 6.5  RBC 2.37* 2.71*  HCT 21.4* 24.5*  PLT 114* 147*    Recent Labs  04/22/15 0453 04/23/15 0511  NA 140 139  K 3.6 3.5  CL 109 107  CO2 24 25  BUN 21* 19  CREATININE 1.30* 1.23*  GLUCOSE 121* 117*  CALCIUM 7.8* 7.7*   No results for input(s): LABPT, INR in the last 72 hours.  Alert and oriented x3. RRR, Lungs clear, BS x4. Left Calf soft and non tender. L hip dressing C/D/I. No DVT signs. No signs of infection or compartment syndrome. LLE grossly neurovascularly intact.   Assessment/Plan: 3 Days Post-Op Procedure(s) (LRB): INTRAMEDULLARY (IM) NAIL FEMORAL (Left) WBAT with walker Up with PT Plan to D/c to SNF when ready. Ready from ortho stand point.  Medicine following On Lovenox  Caprice Mccaffrey L 04/23/2015, 8:03 AM

## 2015-04-23 NOTE — Clinical Social Work Note (Signed)
CSW called ptar for pt transport to [facility].  Pt's POA is signing paperwork and he will meet pt at facility  .Dede Query, LCSW [facility] Clinical Social Worker - Weekend Coverage cell #: [phone]

## 2015-04-23 NOTE — Discharge Summary (Signed)
Physician Discharge Summary  Kelly Conrad MRN: [id number] DOB/AGE: 1931/12/06 82 y.o.  PCP: Lottie Dawson, MD   Admit date: 04/19/2015 Discharge date: 04/23/2015  Discharge Diagnoses:     Principal Problem:   Intertrochanteric fracture of left femur [facility]) Active Problems:   Essential hypertension   Dementia   History of positive PPD   Hearing aid worn   Obesity   Chronic asthma without complication   Hypothyroidism (acquired)   Fracture, intertrochanteric, left femur ([place])   Pertrochanteric fracture of left femur ([place])    Follow-up recommendations Follow-up with PCP in 3-5 days , including all  additional recommended appointments as below Follow-up CBC, CMP in 3-5 days Patient is being discharged to SNF     Medication List    STOP taking these medications        aspirin 81 MG EC tablet     hydrochlorothiazide 12.5 MG capsule  Commonly known as:  MICROZIDE      TAKE these medications        Calcium Carb-Cholecalciferol 600-800 MG-UNIT Tabs  Take 1 tablet by mouth daily.     CENTRUM SILVER PO  Take 1 tablet by mouth daily.     docusate sodium 100 MG capsule  Commonly known as:  COLACE  Take 1 capsule (100 mg total) by mouth 2 (two) times daily.     donepezil 10 MG tablet  Commonly known as:  ARICEPT  TAKE 1 TABLET BY MOUTH AT BEDTIME AS NEEDED     enoxaparin 40 MG/0.4ML injection  Commonly known as:  LOVENOX  Inject 0.4 mLs (40 mg total) into the skin daily.     guaiFENesin 600 MG 12 hr tablet  Commonly known as:  MUCINEX  Take 600 mg by mouth 2 (two) times daily as needed for cough or to loosen phlegm.     HYDROcodone-acetaminophen 5-325 MG tablet  Commonly known as:  NORCO/VICODIN  Take 1 tablet by mouth every 6 (six) hours as needed for moderate pain.     levalbuterol 0.63 MG/3ML nebulizer solution  Commonly known as:  XOPENEX  Take 3 mLs (0.63 mg total) by nebulization every 6 (six) hours as needed for wheezing or shortness of breath.      senna 8.6 MG Tabs tablet  Commonly known as:  SENOKOT  Take 1 tablet (8.6 mg total) by mouth 2 (two) times daily.     simvastatin 40 MG tablet  Commonly known as:  ZOCOR  TAKE 1 TABLET BY MOUTH AT BEDTIME         Discharge Condition: Discharge Instructions       Discharge Instructions    Diet - low sodium heart healthy    Complete by:  As directed      Increase activity slowly    Complete by:  As directed            No Known Allergies    Disposition: 06-Home-Health Care Svc   Consults: * Orthopedics     Significant Diagnostic Studies:  Dg Chest 1 View  04/19/2015  CLINICAL DATA:  [place] this morning. Left hip pain. History of hypertension. EXAM: CHEST 1 VIEW COMPARISON:  09/24/2014 FINDINGS: Cardiac silhouette is mildly enlarged. There are no mediastinal or hilar masses or evidence of adenopathy. There is some central vascular prominence accentuated by low lung volumes. Linear scarring is noted in the right mid lung. No convincing pneumonia or pulmonary edema. No pleural effusion or pneumothorax. Bony thorax is demineralized but grossly intact. IMPRESSION: No  acute cardiopulmonary disease. Electronically Signed   By: Lajean Manes M.D.   On: 04/19/2015 15:12   Pelvis Portable  04/20/2015  CLINICAL DATA:  Status post left IM nail placement EXAM: PORTABLE PELVIS 1-2 VIEWS COMPARISON:  None. FINDINGS: There is a left intertrochanteric fracture transfixed with a left intramedullary nail and interlocking femoral neck screw. There is no other fracture or dislocation. IMPRESSION: Status post ORIF left intertrochanteric fracture. Electronically Signed   By: Kathreen Devoid   On: 04/20/2015 19:19   Dg Chest Port 1 View  04/23/2015  CLINICAL DATA:  Cough and fever EXAM: PORTABLE CHEST 1 VIEW COMPARISON:  April 19, 2015 FINDINGS: There is scarring in the right lower lung zone region. There is mild elevation of the right hemidiaphragm. There is no edema or consolidation. Heart is  borderline enlarged with pulmonary vascularity within normal limits. No adenopathy. IMPRESSION: Scarring in the right lower lung zone. No edema or consolidation. Stable cardiac prominence, stable. No appreciable change compared to recent prior study. Electronically Signed   By: Lowella Grip III M.D.   On: 04/23/2015 09:46   Dg C-arm 1-60 Min-no Report  04/20/2015  CLINICAL DATA: surgery C-ARM 1-60 MINUTES Fluoroscopy was utilized by the requesting physician.  No radiographic interpretation.   Dg Hip Operative Unilat With Pelvis Left  04/20/2015  CLINICAL DATA:  IM nail left hip EXAM: OPERATIVE LEFT HIP (WITH PELVIS IF PERFORMED) 4 VIEWS TECHNIQUE: Fluoroscopic spot image(s) were submitted for interpretation post-operatively. FLUOROSCOPY TIME:  1 minutes 50 seconds COMPARISON:  Left hip radiographs dated 04/19/2015 FINDINGS: Intraoperative fluoroscopic images during IM nail with dynamic hip screw fixation of an intertrochanteric left hip fracture. Distal interlocking screw. Fracture fragments are in near anatomic alignment and position. IMPRESSION: Intraoperative fluoroscopic images during ORIF of a left hip fracture, as above. Electronically Signed   By: Julian Hy M.D.   On: 04/20/2015 18:22   Dg Hip Unilat With Pelvis 2-3 Views Left  04/19/2015  CLINICAL DATA:  Status post fall this morning with a left hip injury. Continued pain. Initial encounter. EXAM: DG HIP (WITH OR WITHOUT PELVIS) 2-3V LEFT COMPARISON:  None. FINDINGS: The patient has an acute left intertrochanteric fracture. The femoral head is located. No other acute bony or joint abnormality is seen. Lower lumbar spondylosis noted. IMPRESSION: Acute left intertrochanteric fracture. Electronically Signed   By: Inge Rise M.D.   On: 04/19/2015 15:11   Dg Femur 1v Left  04/19/2015  CLINICAL DATA:  [place] earlier this morning, LEFT hip pain, history hypertension, asthma, GERD, dementia EXAM: LEFT FEMUR 1 VIEW COMPARISON:  None FINDINGS:  Osseous demineralization. Hip and knee joint alignments grossly normal for positioning. Displaced and comminuted intertrochanteric fracture proximal LEFT femur. No dislocation. Visualized LEFT pelvis intact. Mild atherosclerotic calcifications at the mid LEFT superficial femoral artery. IMPRESSION: Osseous demineralization with a comminuted displaced intertrochanteric fracture LEFT femur. Electronically Signed   By: Lavonia Dana M.D.   On: 04/19/2015 15:11   Dg Femur Min 2 Views Left  04/20/2015  CLINICAL DATA:  Proximal left femur fracture. EXAM: LEFT FEMUR 2 VIEWS COMPARISON:  April 19, 2015. FINDINGS: Status post intra medullary rod fixation of comminuted fracture of the intertrochanteric proximal left femur. Improved alignment of fracture components is noted. IMPRESSION: Status post intra medullary rod fixation of proximal left femoral fracture. Electronically Signed   By: Marijo Conception, M.D.   On: 04/20/2015 19:18        Filed Weights   04/19/15 1347 04/19/15 1702  Weight: 76.658 kg (169 lb) 77.61 kg (171 lb 1.6 oz)     Microbiology: Recent Results (from the past 240 hour(s))  Surgical pcr screen     Status: None   Collection Time: 04/20/15  6:23 AM  Result Value Ref Range Status   MRSA, PCR NEGATIVE NEGATIVE Final   Staphylococcus aureus NEGATIVE NEGATIVE Final    Comment:        The Xpert SA Assay (FDA approved for NASAL specimens in patients over 85 years of age), is one component of a comprehensive surveillance program.  Test performance has been validated by [facility] for patients greater than or equal to 48 year old. It is not intended to diagnose infection nor to guide or monitor treatment.        Blood Culture    Component Value Date/Time   SDES BLOOD LEFT ARM 07/01/2014 1448   SPECREQUEST BOTTLES DRAWN AEROBIC AND ANAEROBIC 10CC 07/01/2014 1448   CULT  07/01/2014 1448    NO GROWTH 5 DAYS Performed at [place] 07/08/2014  FINAL 07/01/2014 1448      Labs: Results for orders placed or performed during the hospital encounter of 04/19/15 (from the past 48 hour(s))  CBC     Status: Abnormal   Collection Time: 04/22/15  4:53 AM  Result Value Ref Range   WBC 5.7 4.0 - 10.5 K/uL   RBC 2.37 (L) 3.87 - 5.11 MIL/uL   Hemoglobin 7.0 (L) 12.0 - 15.0 g/dL   HCT 21.4 (L) 36.0 - 46.0 %   MCV 90.3 78.0 - 100.0 fL   MCH 29.5 26.0 - 34.0 pg   MCHC 32.7 30.0 - 36.0 g/dL   RDW 14.2 11.5 - 15.5 %   Platelets 114 (L) 150 - 400 K/uL    Comment: REPEATED TO VERIFY CONSISTENT WITH PREVIOUS RESULT   Comprehensive metabolic panel     Status: Abnormal   Collection Time: 04/22/15  4:53 AM  Result Value Ref Range   Sodium 140 135 - 145 mmol/L   Potassium 3.6 3.5 - 5.1 mmol/L   Chloride 109 101 - 111 mmol/L   CO2 24 22 - 32 mmol/L   Glucose, Bld 121 (H) 65 - 99 mg/dL   BUN 21 (H) 6 - 20 mg/dL   Creatinine, Ser 1.30 (H) 0.44 - 1.00 mg/dL   Calcium 7.8 (L) 8.9 - 10.3 mg/dL   Total Protein 4.8 (L) 6.5 - 8.1 g/dL   Albumin 2.3 (L) 3.5 - 5.0 g/dL   AST 32 15 - 41 U/L   ALT 8 (L) 14 - 54 U/L   Alkaline Phosphatase 47 38 - 126 U/L   Total Bilirubin 0.8 0.3 - 1.2 mg/dL   GFR calc non Af Amer 37 (L) >60 mL/min   GFR calc Af Amer 43 (L) >60 mL/min    Comment: (NOTE) The eGFR has been calculated using the CKD EPI equation. This calculation has not been validated in all clinical situations. eGFR's persistently <60 mL/min signify possible Chronic Kidney Disease.    Anion gap 7 5 - 15  Prepare RBC     Status: None   Collection Time: 04/22/15  8:30 AM  Result Value Ref Range   Order Confirmation ORDER PROCESSED BY BLOOD BANK   CBC     Status: Abnormal   Collection Time: 04/23/15  5:11 AM  Result Value Ref Range   WBC 6.5 4.0 - 10.5 K/uL   RBC 2.71 (L) 3.87 -  5.11 MIL/uL   Hemoglobin 8.0 (L) 12.0 - 15.0 g/dL   HCT 24.5 (L) 36.0 - 46.0 %   MCV 90.4 78.0 - 100.0 fL   MCH 29.5 26.0 - 34.0 pg   MCHC 32.7 30.0 - 36.0 g/dL    RDW 14.2 11.5 - 15.5 %   Platelets 147 (L) 150 - 400 K/uL  Comprehensive metabolic panel     Status: Abnormal   Collection Time: 04/23/15  5:11 AM  Result Value Ref Range   Sodium 139 135 - 145 mmol/L   Potassium 3.5 3.5 - 5.1 mmol/L   Chloride 107 101 - 111 mmol/L   CO2 25 22 - 32 mmol/L   Glucose, Bld 117 (H) 65 - 99 mg/dL   BUN 19 6 - 20 mg/dL   Creatinine, Ser 1.23 (H) 0.44 - 1.00 mg/dL   Calcium 7.7 (L) 8.9 - 10.3 mg/dL   Total Protein 5.0 (L) 6.5 - 8.1 g/dL   Albumin 2.2 (L) 3.5 - 5.0 g/dL   AST 28 15 - 41 U/L   ALT 9 (L) 14 - 54 U/L   Alkaline Phosphatase 45 38 - 126 U/L   Total Bilirubin 0.6 0.3 - 1.2 mg/dL   GFR calc non Af Amer 40 (L) >60 mL/min   GFR calc Af Amer 46 (L) >60 mL/min    Comment: (NOTE) The eGFR has been calculated using the CKD EPI equation. This calculation has not been validated in all clinical situations. eGFR's persistently <60 mL/min signify possible Chronic Kidney Disease.    Anion gap 7 5 - 15     Lipid Panel     Component Value Date/Time   CHOL 226* 11/11/2014 1010   TRIG 231.0* 11/11/2014 1010   HDL 50.50 11/11/2014 1010   CHOLHDL 4 11/11/2014 1010   VLDL 46.2* 11/11/2014 1010   LDLCALC 112* 03/02/2014 0857   LDLDIRECT 107.0 11/11/2014 1010     Lab Results  Component Value Date   HGBA1C 5.8 11/11/2014   HGBA1C 6.0* 06/29/2014     Lab Results  Component Value Date   LDLCALC 112* 03/02/2014   CREATININE 1.23* 04/23/2015     Brief summary 82 year old female with a past medical history of hypertension, dyslipidemia, gastroesophageal reflux disease, asthma, dementia without behavioral disturbance, who presents to the ER after a fall. Patient lives with her friend called [name] who has taken care of her for 22 years. He provides most of the history. He states that the patient is fairly independent with her ambulation and does not use a walker or a cane. For the last 2 weeks she has had some shortness of breath with  exertion, but no chest pain. He attributes it to sinus infection and chest congestion and has been treating her with Mucinex. Her chest x-ray is negative. Apparently the patient slipped and fell while she was trying to mop the floor and the floor happened to be wet. She was unable to get up to walk due to pain. She was brought in by EMS and was noted to have shortening and rotation of the left leg.  Assessment and plan  Intertrochanteric fracture of left femur [facility]) Patient followed by Rod Can, MD,  According to the revised [facility] cardiac risk index, patient is at low to moderate risk from a cardiac standpoint POA agreed to proceed with surgery Patient status post surgery 2/8 Hemoglobin dropped to 7.0, transfused 1 unit of packed red blood cells today, hemoglobin 8.0. With appropriate correction continue  Lovenox for DVT prophylaxis, no active bleeding   Acute blood loss anemia Hemoglobin drop from 10.1>-7.0, platelet count has dropped from 170-116>114 Continue Lovenox for DVT prophylaxis for now Transfuse 1 units of packed red blood cells Would consider discontinuing Lovenox for platelets less than 100,000  Continue Lovenox for DVT prophylaxis with 30 days post surgery   Acute kidney injury in the setting of chronic kidney disease stage III-baseline creatinine 1.4, presented with a creatinine of 1.83, currently 1.35 , discontinued IV fluids, likely secondary to dehydration,    Essential hypertension-controlled, will continue prn hydralazine, blood pressure soft, continue to monitor. HCTZ discontinued because of renal insufficiency  dementia-continue Aricept   History of positive PPD-This may be repeated if SNF placement Is needed  Hard of hearing-patient has hearing aids   Obesity- Body mass index is 31.95 kg/(m^2).   Chronic asthma without complication-chest x-ray negative, will start patient on when necessary Xopenex treatments   Hypothyroidism (acquired)- TSH 2.32  and continue Synthroid   DVT prophylaxsis Lovenox   Discharge Exam Blood pressure 132/54, pulse 85, temperature 98.3 F (36.8 C), temperature source Oral, resp. rate 16, height _0  (1.549 m), weight 77.61 kg (171 lb 1.6 oz), SpO2 97 %.  General: No acute respiratory distress Lungs: Clear to auscultation bilaterally without wheezes or crackles Cardiovascular: Regular rate and rhythm without murmur gallop or rub normal S1 and S2 Abdomen: Nontender, nondistended, soft, bowel sounds positive, no rebound, no ascites, no appreciable mass Extremities: External rotation of the left hip,    Follow-up Information    Follow up with Swinte[place], Horald Pollen, MD. Schedule an appointment as soon as possible for a visit in 2 weeks.   Specialty:  Orthopedic Surgery   Why:  For wound re-check   Contact information:   [address] [place] [address] [phone]       Follow up with [facility] SNF.   Specialty:  [place] information:   [address] [phone]      Signed: Reyne Dumas 04/23/2015, 10:54 AM        Time spent >45 mins

## 2015-04-24 LAB — TYPE AND SCREEN
ABO/RH(D): A POS
ANTIBODY SCREEN: NEGATIVE
Unit division: 0

## 2015-04-25 DIAGNOSIS — N179 Acute kidney failure, unspecified: Secondary | ICD-10-CM | POA: Diagnosis not present

## 2015-04-25 DIAGNOSIS — I10 Essential (primary) hypertension: Secondary | ICD-10-CM | POA: Diagnosis not present

## 2015-04-25 DIAGNOSIS — S72145S Nondisplaced intertrochanteric fracture of left femur, sequela: Secondary | ICD-10-CM | POA: Diagnosis not present

## 2015-04-25 DIAGNOSIS — D62 Acute posthemorrhagic anemia: Secondary | ICD-10-CM | POA: Diagnosis not present

## 2015-04-26 DIAGNOSIS — I10 Essential (primary) hypertension: Secondary | ICD-10-CM | POA: Diagnosis not present

## 2015-04-26 DIAGNOSIS — N179 Acute kidney failure, unspecified: Secondary | ICD-10-CM | POA: Diagnosis not present

## 2015-04-26 DIAGNOSIS — S72145S Nondisplaced intertrochanteric fracture of left femur, sequela: Secondary | ICD-10-CM | POA: Diagnosis not present

## 2015-04-29 DIAGNOSIS — E039 Hypothyroidism, unspecified: Secondary | ICD-10-CM | POA: Diagnosis not present

## 2015-04-29 DIAGNOSIS — J45909 Unspecified asthma, uncomplicated: Secondary | ICD-10-CM | POA: Diagnosis not present

## 2015-04-29 DIAGNOSIS — S7292XA Unspecified fracture of left femur, initial encounter for closed fracture: Secondary | ICD-10-CM | POA: Diagnosis not present

## 2015-04-29 DIAGNOSIS — F0391 Unspecified dementia with behavioral disturbance: Secondary | ICD-10-CM | POA: Diagnosis not present

## 2015-04-29 DIAGNOSIS — I10 Essential (primary) hypertension: Secondary | ICD-10-CM | POA: Diagnosis not present

## 2015-05-02 DIAGNOSIS — I10 Essential (primary) hypertension: Secondary | ICD-10-CM | POA: Diagnosis not present

## 2015-05-02 DIAGNOSIS — S72145S Nondisplaced intertrochanteric fracture of left femur, sequela: Secondary | ICD-10-CM | POA: Diagnosis not present

## 2015-05-02 DIAGNOSIS — N179 Acute kidney failure, unspecified: Secondary | ICD-10-CM | POA: Diagnosis not present

## 2015-05-04 DIAGNOSIS — Z4789 Encounter for other orthopedic aftercare: Secondary | ICD-10-CM | POA: Diagnosis not present

## 2015-05-09 ENCOUNTER — Telehealth: Payer: Self-pay | Admitting: Family Medicine

## 2015-05-09 NOTE — Telephone Encounter (Signed)
Caretaker came by the office.  Pt is scheduled to come home on Thursday.  Front desk is working on moving appointment.

## 2015-05-09 NOTE — Telephone Encounter (Signed)
-----   Message from Burnis Medin, MD sent at 05/08/2015 12:37 PM EST ----- Regarding: pateint was in hosp and dc   to snf on feb 11  prob need to change ? appt this week Hosp notes say  Was to go to snf  Dc FEB 2 11   For her hip fracture  Fu  We  May need to  move her appt   Unless needs pcp visit . At this time... Can  You call her guardian and ascertain if she is home or  In rehab   If so need dc summary from  snf  Also need 30 minutes  Next time  I do see her.

## 2015-05-11 ENCOUNTER — Ambulatory Visit: Payer: Medicare Other | Admitting: Internal Medicine

## 2015-05-12 DIAGNOSIS — S72145S Nondisplaced intertrochanteric fracture of left femur, sequela: Secondary | ICD-10-CM | POA: Diagnosis not present

## 2015-05-12 DIAGNOSIS — E039 Hypothyroidism, unspecified: Secondary | ICD-10-CM | POA: Diagnosis not present

## 2015-05-12 DIAGNOSIS — I10 Essential (primary) hypertension: Secondary | ICD-10-CM | POA: Diagnosis not present

## 2015-05-14 DIAGNOSIS — S72145D Nondisplaced intertrochanteric fracture of left femur, subsequent encounter for closed fracture with routine healing: Secondary | ICD-10-CM | POA: Diagnosis not present

## 2015-05-14 DIAGNOSIS — I129 Hypertensive chronic kidney disease with stage 1 through stage 4 chronic kidney disease, or unspecified chronic kidney disease: Secondary | ICD-10-CM | POA: Diagnosis not present

## 2015-05-14 DIAGNOSIS — N183 Chronic kidney disease, stage 3 (moderate): Secondary | ICD-10-CM | POA: Diagnosis not present

## 2015-05-14 DIAGNOSIS — J441 Chronic obstructive pulmonary disease with (acute) exacerbation: Secondary | ICD-10-CM | POA: Diagnosis not present

## 2015-05-17 DIAGNOSIS — I129 Hypertensive chronic kidney disease with stage 1 through stage 4 chronic kidney disease, or unspecified chronic kidney disease: Secondary | ICD-10-CM | POA: Diagnosis not present

## 2015-05-17 DIAGNOSIS — J441 Chronic obstructive pulmonary disease with (acute) exacerbation: Secondary | ICD-10-CM | POA: Diagnosis not present

## 2015-05-17 DIAGNOSIS — N183 Chronic kidney disease, stage 3 (moderate): Secondary | ICD-10-CM | POA: Diagnosis not present

## 2015-05-17 DIAGNOSIS — S72145D Nondisplaced intertrochanteric fracture of left femur, subsequent encounter for closed fracture with routine healing: Secondary | ICD-10-CM | POA: Diagnosis not present

## 2015-05-19 DIAGNOSIS — J441 Chronic obstructive pulmonary disease with (acute) exacerbation: Secondary | ICD-10-CM | POA: Diagnosis not present

## 2015-05-19 DIAGNOSIS — I129 Hypertensive chronic kidney disease with stage 1 through stage 4 chronic kidney disease, or unspecified chronic kidney disease: Secondary | ICD-10-CM | POA: Diagnosis not present

## 2015-05-19 DIAGNOSIS — N183 Chronic kidney disease, stage 3 (moderate): Secondary | ICD-10-CM | POA: Diagnosis not present

## 2015-05-19 DIAGNOSIS — S72145D Nondisplaced intertrochanteric fracture of left femur, subsequent encounter for closed fracture with routine healing: Secondary | ICD-10-CM | POA: Diagnosis not present

## 2015-05-20 DIAGNOSIS — J441 Chronic obstructive pulmonary disease with (acute) exacerbation: Secondary | ICD-10-CM | POA: Diagnosis not present

## 2015-05-20 DIAGNOSIS — I129 Hypertensive chronic kidney disease with stage 1 through stage 4 chronic kidney disease, or unspecified chronic kidney disease: Secondary | ICD-10-CM | POA: Diagnosis not present

## 2015-05-20 DIAGNOSIS — N183 Chronic kidney disease, stage 3 (moderate): Secondary | ICD-10-CM | POA: Diagnosis not present

## 2015-05-20 DIAGNOSIS — S72145D Nondisplaced intertrochanteric fracture of left femur, subsequent encounter for closed fracture with routine healing: Secondary | ICD-10-CM | POA: Diagnosis not present

## 2015-05-26 DIAGNOSIS — N183 Chronic kidney disease, stage 3 (moderate): Secondary | ICD-10-CM | POA: Diagnosis not present

## 2015-05-26 DIAGNOSIS — L603 Nail dystrophy: Secondary | ICD-10-CM | POA: Diagnosis not present

## 2015-05-26 DIAGNOSIS — I129 Hypertensive chronic kidney disease with stage 1 through stage 4 chronic kidney disease, or unspecified chronic kidney disease: Secondary | ICD-10-CM | POA: Diagnosis not present

## 2015-05-26 DIAGNOSIS — S72145D Nondisplaced intertrochanteric fracture of left femur, subsequent encounter for closed fracture with routine healing: Secondary | ICD-10-CM | POA: Diagnosis not present

## 2015-05-26 DIAGNOSIS — L97429 Non-pressure chronic ulcer of left heel and midfoot with unspecified severity: Secondary | ICD-10-CM | POA: Diagnosis not present

## 2015-05-26 DIAGNOSIS — I739 Peripheral vascular disease, unspecified: Secondary | ICD-10-CM | POA: Diagnosis not present

## 2015-05-26 DIAGNOSIS — J441 Chronic obstructive pulmonary disease with (acute) exacerbation: Secondary | ICD-10-CM | POA: Diagnosis not present

## 2015-05-26 DIAGNOSIS — M79672 Pain in left foot: Secondary | ICD-10-CM | POA: Diagnosis not present

## 2015-05-27 DIAGNOSIS — I129 Hypertensive chronic kidney disease with stage 1 through stage 4 chronic kidney disease, or unspecified chronic kidney disease: Secondary | ICD-10-CM | POA: Diagnosis not present

## 2015-05-27 DIAGNOSIS — J441 Chronic obstructive pulmonary disease with (acute) exacerbation: Secondary | ICD-10-CM | POA: Diagnosis not present

## 2015-05-27 DIAGNOSIS — S72145D Nondisplaced intertrochanteric fracture of left femur, subsequent encounter for closed fracture with routine healing: Secondary | ICD-10-CM | POA: Diagnosis not present

## 2015-05-27 DIAGNOSIS — L97429 Non-pressure chronic ulcer of left heel and midfoot with unspecified severity: Secondary | ICD-10-CM | POA: Diagnosis not present

## 2015-05-27 DIAGNOSIS — N183 Chronic kidney disease, stage 3 (moderate): Secondary | ICD-10-CM | POA: Diagnosis not present

## 2015-05-28 DIAGNOSIS — N183 Chronic kidney disease, stage 3 (moderate): Secondary | ICD-10-CM | POA: Diagnosis not present

## 2015-05-28 DIAGNOSIS — J441 Chronic obstructive pulmonary disease with (acute) exacerbation: Secondary | ICD-10-CM | POA: Diagnosis not present

## 2015-05-28 DIAGNOSIS — I129 Hypertensive chronic kidney disease with stage 1 through stage 4 chronic kidney disease, or unspecified chronic kidney disease: Secondary | ICD-10-CM | POA: Diagnosis not present

## 2015-05-28 DIAGNOSIS — S72145D Nondisplaced intertrochanteric fracture of left femur, subsequent encounter for closed fracture with routine healing: Secondary | ICD-10-CM | POA: Diagnosis not present

## 2015-05-30 DIAGNOSIS — N183 Chronic kidney disease, stage 3 (moderate): Secondary | ICD-10-CM | POA: Diagnosis not present

## 2015-05-30 DIAGNOSIS — I129 Hypertensive chronic kidney disease with stage 1 through stage 4 chronic kidney disease, or unspecified chronic kidney disease: Secondary | ICD-10-CM | POA: Diagnosis not present

## 2015-05-30 DIAGNOSIS — S72145D Nondisplaced intertrochanteric fracture of left femur, subsequent encounter for closed fracture with routine healing: Secondary | ICD-10-CM | POA: Diagnosis not present

## 2015-05-30 DIAGNOSIS — J441 Chronic obstructive pulmonary disease with (acute) exacerbation: Secondary | ICD-10-CM | POA: Diagnosis not present

## 2015-05-31 DIAGNOSIS — I129 Hypertensive chronic kidney disease with stage 1 through stage 4 chronic kidney disease, or unspecified chronic kidney disease: Secondary | ICD-10-CM | POA: Diagnosis not present

## 2015-05-31 DIAGNOSIS — N183 Chronic kidney disease, stage 3 (moderate): Secondary | ICD-10-CM | POA: Diagnosis not present

## 2015-05-31 DIAGNOSIS — S72145D Nondisplaced intertrochanteric fracture of left femur, subsequent encounter for closed fracture with routine healing: Secondary | ICD-10-CM | POA: Diagnosis not present

## 2015-05-31 DIAGNOSIS — J441 Chronic obstructive pulmonary disease with (acute) exacerbation: Secondary | ICD-10-CM | POA: Diagnosis not present

## 2015-06-01 DIAGNOSIS — J441 Chronic obstructive pulmonary disease with (acute) exacerbation: Secondary | ICD-10-CM | POA: Diagnosis not present

## 2015-06-01 DIAGNOSIS — S72145D Nondisplaced intertrochanteric fracture of left femur, subsequent encounter for closed fracture with routine healing: Secondary | ICD-10-CM | POA: Diagnosis not present

## 2015-06-01 DIAGNOSIS — I129 Hypertensive chronic kidney disease with stage 1 through stage 4 chronic kidney disease, or unspecified chronic kidney disease: Secondary | ICD-10-CM | POA: Diagnosis not present

## 2015-06-01 DIAGNOSIS — Z4789 Encounter for other orthopedic aftercare: Secondary | ICD-10-CM | POA: Diagnosis not present

## 2015-06-01 DIAGNOSIS — N183 Chronic kidney disease, stage 3 (moderate): Secondary | ICD-10-CM | POA: Diagnosis not present

## 2015-06-01 DIAGNOSIS — S72102D Unspecified trochanteric fracture of left femur, subsequent encounter for closed fracture with routine healing: Secondary | ICD-10-CM | POA: Diagnosis not present

## 2015-06-04 DIAGNOSIS — I129 Hypertensive chronic kidney disease with stage 1 through stage 4 chronic kidney disease, or unspecified chronic kidney disease: Secondary | ICD-10-CM | POA: Diagnosis not present

## 2015-06-04 DIAGNOSIS — J441 Chronic obstructive pulmonary disease with (acute) exacerbation: Secondary | ICD-10-CM | POA: Diagnosis not present

## 2015-06-04 DIAGNOSIS — N183 Chronic kidney disease, stage 3 (moderate): Secondary | ICD-10-CM | POA: Diagnosis not present

## 2015-06-04 DIAGNOSIS — S72145D Nondisplaced intertrochanteric fracture of left femur, subsequent encounter for closed fracture with routine healing: Secondary | ICD-10-CM | POA: Diagnosis not present

## 2015-06-06 DIAGNOSIS — N183 Chronic kidney disease, stage 3 (moderate): Secondary | ICD-10-CM | POA: Diagnosis not present

## 2015-06-06 DIAGNOSIS — I129 Hypertensive chronic kidney disease with stage 1 through stage 4 chronic kidney disease, or unspecified chronic kidney disease: Secondary | ICD-10-CM | POA: Diagnosis not present

## 2015-06-06 DIAGNOSIS — J441 Chronic obstructive pulmonary disease with (acute) exacerbation: Secondary | ICD-10-CM | POA: Diagnosis not present

## 2015-06-06 DIAGNOSIS — S72145D Nondisplaced intertrochanteric fracture of left femur, subsequent encounter for closed fracture with routine healing: Secondary | ICD-10-CM | POA: Diagnosis not present

## 2015-06-07 ENCOUNTER — Ambulatory Visit (INDEPENDENT_AMBULATORY_CARE_PROVIDER_SITE_OTHER): Payer: Medicare Other | Admitting: Internal Medicine

## 2015-06-07 ENCOUNTER — Encounter: Payer: Self-pay | Admitting: Internal Medicine

## 2015-06-07 VITALS — BP 120/60 | HR 79 | Temp 98.4°F | Wt 169.3 lb

## 2015-06-07 DIAGNOSIS — Z8781 Personal history of (healed) traumatic fracture: Secondary | ICD-10-CM | POA: Diagnosis not present

## 2015-06-07 DIAGNOSIS — I129 Hypertensive chronic kidney disease with stage 1 through stage 4 chronic kidney disease, or unspecified chronic kidney disease: Secondary | ICD-10-CM | POA: Diagnosis not present

## 2015-06-07 DIAGNOSIS — D649 Anemia, unspecified: Secondary | ICD-10-CM | POA: Diagnosis not present

## 2015-06-07 DIAGNOSIS — N289 Disorder of kidney and ureter, unspecified: Secondary | ICD-10-CM | POA: Diagnosis not present

## 2015-06-07 DIAGNOSIS — F0390 Unspecified dementia without behavioral disturbance: Secondary | ICD-10-CM | POA: Diagnosis not present

## 2015-06-07 DIAGNOSIS — IMO0001 Reserved for inherently not codable concepts without codable children: Secondary | ICD-10-CM

## 2015-06-07 DIAGNOSIS — N183 Chronic kidney disease, stage 3 (moderate): Secondary | ICD-10-CM | POA: Diagnosis not present

## 2015-06-07 DIAGNOSIS — S72145D Nondisplaced intertrochanteric fracture of left femur, subsequent encounter for closed fracture with routine healing: Secondary | ICD-10-CM | POA: Diagnosis not present

## 2015-06-07 DIAGNOSIS — J441 Chronic obstructive pulmonary disease with (acute) exacerbation: Secondary | ICD-10-CM | POA: Diagnosis not present

## 2015-06-07 DIAGNOSIS — R54 Age-related physical debility: Secondary | ICD-10-CM

## 2015-06-07 LAB — CBC WITH DIFFERENTIAL/PLATELET
BASOS ABS: 0 10*3/uL (ref 0.0–0.1)
Basophils Relative: 0.4 % (ref 0.0–3.0)
EOS ABS: 0.2 10*3/uL (ref 0.0–0.7)
Eosinophils Relative: 2.5 % (ref 0.0–5.0)
HEMATOCRIT: 39.1 % (ref 36.0–46.0)
HEMOGLOBIN: 12.6 g/dL (ref 12.0–15.0)
LYMPHS PCT: 23.1 % (ref 12.0–46.0)
Lymphs Abs: 2.1 10*3/uL (ref 0.7–4.0)
MCHC: 32.3 g/dL (ref 30.0–36.0)
MCV: 83.3 fl (ref 78.0–100.0)
MONOS PCT: 7.4 % (ref 3.0–12.0)
Monocytes Absolute: 0.7 10*3/uL (ref 0.1–1.0)
Neutro Abs: 6.2 10*3/uL (ref 1.4–7.7)
Neutrophils Relative %: 66.6 % (ref 43.0–77.0)
Platelets: 275 10*3/uL (ref 150.0–400.0)
RBC: 4.69 Mil/uL (ref 3.87–5.11)
RDW: 15.8 % — ABNORMAL HIGH (ref 11.5–15.5)
WBC: 9.3 10*3/uL (ref 4.0–10.5)

## 2015-06-07 LAB — BASIC METABOLIC PANEL
BUN: 28 mg/dL — ABNORMAL HIGH (ref 6–23)
CALCIUM: 9.8 mg/dL (ref 8.4–10.5)
CHLORIDE: 102 meq/L (ref 96–112)
CO2: 28 meq/L (ref 19–32)
CREATININE: 1.33 mg/dL — AB (ref 0.40–1.20)
GFR: 40.57 mL/min — ABNORMAL LOW (ref >60.00)
Glucose, Bld: 102 mg/dL — ABNORMAL HIGH (ref 70–99)
Potassium: 4.1 mEq/L (ref 3.5–5.1)
SODIUM: 141 meq/L (ref 135–145)

## 2015-06-07 NOTE — Progress Notes (Signed)
Chief Complaint  Patient presents with  . Follow-up    hosp and snf hip fracture    HPI: Kelly Conrad 82 y.o.    Was hoptiatlized after fall  After mopping the floor   closed hip fractureleft and surgery   [facility]  2 7 17    Was dc  To snf   Followed by ?  ortho Taken off  hctz  And then back  No dc summary from  snf    Received transfusion before dc  .  No bleeding falling  At end of PT  Prefers to use walker  But can be cued to do cane and walk     Also he ntoed small ulcer side of left foot and sacral that he is dressing and covering   Sees foot doc at end of week.  No other change in comment  usuing only ocass  To rare hydrocodone  Has  Some left  ROS: See pertinent positives and negatives per HPI. No edema cp sob  Other change in ms   Past Medical History  Diagnosis Date  . Allergy   . Hypertension   . Hyperlipidemia   . GERD (gastroesophageal reflux disease)   . Asthma   . Hx of varicella   . History of positive PPD     gets c xray screen  . Dementia     Family History  Problem Relation Age of Onset  . Hypertension      fhx  . Diabetes Mother   . Stroke Mother   . Diabetes Father   . Edema Father     legs    Social History   Social History  . Marital Status: Single    Spouse Name: N/A  . Number of Children: N/A  . Years of Education: N/A   Social History Main Topics  . Smoking status: Never Smoker   . Smokeless tobacco: Never Used  . Alcohol Use: No  . Drug Use: No  . Sexual Activity: No   Other Topics Concern  . 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 [name]    He is the main caretaker. Marvina took care of his mom  Who has since passed away.   He has some vision problems and partial amputation but  No mobiilty problems       Neg ets FA safely stored smoke alarm   No tob or etoh.    Outpatient Prescriptions Prior to Visit  Medication Sig Dispense  Refill  . Calcium Carb-Cholecalciferol 600-800 MG-UNIT TABS Take 1 tablet by mouth daily.    Marland Kitchen docusate sodium (COLACE) 100 MG capsule Take 1 capsule (100 mg total) by mouth 2 (two) times daily. 10 capsule 0  . donepezil (ARICEPT) 10 MG tablet TAKE 1 TABLET BY MOUTH AT BEDTIME AS NEEDED (Patient taking differently: TAKE 1 TABLET BY MOUTH AT BEDTIME) 30 tablet 0  . guaiFENesin (MUCINEX) 600 MG 12 hr tablet Take 600 mg by mouth 2 (two) times daily as needed for cough or to loosen phlegm.     Marland Kitchen HYDROcodone-acetaminophen (NORCO/VICODIN) 5-325 MG tablet Take 1 tablet by mouth every 6 (six) hours as needed for moderate pain. 30 tablet 0  . Multiple Vitamins-Minerals (CENTRUM SILVER PO) Take 1 tablet by mouth daily.     . simvastatin (ZOCOR) 40 MG tablet TAKE 1 TABLET BY MOUTH  AT BEDTIME 90 tablet 2  . enoxaparin (LOVENOX) 40 MG/0.4ML injection Inject 0.4 mLs (40 mg total) into the skin daily. 30 Syringe 0  . levalbuterol (XOPENEX) 0.63 MG/3ML nebulizer solution Take 3 mLs (0.63 mg total) by nebulization every 6 (six) hours as needed for wheezing or shortness of breath. 3 mL 12  . senna (SENOKOT) 8.6 MG TABS tablet Take 1 tablet (8.6 mg total) by mouth 2 (two) times daily. 120 each 0   No facility-administered medications prior to visit.     EXAM:  BP 120/60 mmHg  Pulse 79  Temp(Src) 98.4 F (36.9 C) (Oral)  Wt 169 lb 4.8 oz (76.794 kg)  Body mass index is 32.01 kg/(m^2).  GENERAL: vitals reviewed and listed above, alert, o appears well hydrated and in no acute distress  Walks gingerly with cane some weakness on arising but when up full weigh bear  HEENT: atraumatic, conjunctiva  clear, no obvious abnormalities on inspection of external nose and ears NECK: no obvious masses on inspection palpation  LUNGS: clear to auscultation bilaterally, no wheezes, rales or rhonchi, good air movement CV: HRRR, no clubbing cyanosis or nl cap refill   move all extremities ambulatory with cane and  spotting   follows direction  Not a lot of talking  Did not unwrap foot area at  Caretaker request  ( he has dont this vefore and to see foot doc on Friday) Lab Results  Component Value Date   WBC 9.3 06/07/2015   HGB 12.6 06/07/2015   HCT 39.1 06/07/2015   PLT 275.0 06/07/2015   GLUCOSE 102* 06/07/2015   CHOL 226* 11/11/2014   TRIG 231.0* 11/11/2014   HDL 50.50 11/11/2014   LDLDIRECT 107.0 11/11/2014   LDLCALC 112* 03/02/2014   ALT 9* 04/23/2015   AST 28 04/23/2015   NA 141 06/07/2015   K 4.1 06/07/2015   CL 102 06/07/2015   CREATININE 1.33* 06/07/2015   BUN 28* 06/07/2015   CO2 28 06/07/2015   TSH 2.320 04/19/2015   INR 1.07 04/19/2015   HGBA1C 5.8 11/11/2014    ASSESSMENT AND PLAN:  Discussed the following assessment and plan:  Renal insufficiency - Plan: Basic metabolic panel, CBC with Differential/Platelet  Hx of fracture of left hip  Dementia, without behavioral disturbance  Anemia, unspecified anemia type - Plan: CBC with Differential/Platelet  Age factor  Benign hypertensive renal disease Recheck labs   consideration of med for osteoporosis   Be definition  Has this with hip fx and falll  No known swallowing difficult  Disc mobility   May need to get pt to contact   orhto office for poss extension of pt .  Still seems dependent on   help walking  -Patient advised to return or notify health care team  if symptoms worsen ,persist or new concerns arise. Total visit 56mins > 50% spent counseling and coordinating care as indicated in above note and in instructions to patient .   Patient Instructions   Will notify you  of labs when available.  You are doing a good job of helping her get back on her feet.   Consider adding med for osteoporosis  To prevention  other fracture.  Plan ROV  depending on labs      BP Readings from Last 3 Encounters:  06/07/15 120/60  04/23/15 132/54  11/11/14 146/70   Wt Readings from Last 3 Encounters:  06/07/15 169 lb 4.8 oz (76.794  kg)  04/19/15 171 lb 1.6 oz (77.61 kg)  11/11/14 162 lb 11.2 oz (73.8 kg)       Wanda K. Panosh M.D.

## 2015-06-07 NOTE — Patient Instructions (Signed)
Will notify you  of labs when available.  You are doing a good job of helping her get back on her feet.   Consider adding med for osteoporosis  To prevention  other fracture.  Plan ROV  depending on labs      BP Readings from Last 3 Encounters:  06/07/15 120/60  04/23/15 132/54  11/11/14 146/70   Wt Readings from Last 3 Encounters:  06/07/15 169 lb 4.8 oz (76.794 kg)  04/19/15 171 lb 1.6 oz (77.61 kg)  11/11/14 162 lb 11.2 oz (73.8 kg)

## 2015-06-07 NOTE — Progress Notes (Signed)
Pre visit review using our clinic review tool, if applicable. No additional management support is needed unless otherwise documented below in the visit note. 

## 2015-06-09 DIAGNOSIS — I129 Hypertensive chronic kidney disease with stage 1 through stage 4 chronic kidney disease, or unspecified chronic kidney disease: Secondary | ICD-10-CM | POA: Diagnosis not present

## 2015-06-09 DIAGNOSIS — J441 Chronic obstructive pulmonary disease with (acute) exacerbation: Secondary | ICD-10-CM | POA: Diagnosis not present

## 2015-06-09 DIAGNOSIS — S72145D Nondisplaced intertrochanteric fracture of left femur, subsequent encounter for closed fracture with routine healing: Secondary | ICD-10-CM | POA: Diagnosis not present

## 2015-06-09 DIAGNOSIS — N183 Chronic kidney disease, stage 3 (moderate): Secondary | ICD-10-CM | POA: Diagnosis not present

## 2015-06-10 DIAGNOSIS — L89622 Pressure ulcer of left heel, stage 2: Secondary | ICD-10-CM | POA: Diagnosis not present

## 2015-06-13 DIAGNOSIS — S72145D Nondisplaced intertrochanteric fracture of left femur, subsequent encounter for closed fracture with routine healing: Secondary | ICD-10-CM | POA: Diagnosis not present

## 2015-06-13 DIAGNOSIS — N183 Chronic kidney disease, stage 3 (moderate): Secondary | ICD-10-CM | POA: Diagnosis not present

## 2015-06-13 DIAGNOSIS — J441 Chronic obstructive pulmonary disease with (acute) exacerbation: Secondary | ICD-10-CM | POA: Diagnosis not present

## 2015-06-13 DIAGNOSIS — I129 Hypertensive chronic kidney disease with stage 1 through stage 4 chronic kidney disease, or unspecified chronic kidney disease: Secondary | ICD-10-CM | POA: Diagnosis not present

## 2015-06-14 ENCOUNTER — Telehealth: Payer: Self-pay | Admitting: Internal Medicine

## 2015-06-14 NOTE — Telephone Encounter (Signed)
Kelly Conrad would like a call back with pt lab results

## 2015-06-14 NOTE — Telephone Encounter (Signed)
Left message for Kelly Conrad to call back.  

## 2015-06-15 NOTE — Telephone Encounter (Signed)
Will now close this note.  See result note.

## 2015-06-22 DIAGNOSIS — N183 Chronic kidney disease, stage 3 (moderate): Secondary | ICD-10-CM | POA: Diagnosis not present

## 2015-06-22 DIAGNOSIS — J441 Chronic obstructive pulmonary disease with (acute) exacerbation: Secondary | ICD-10-CM | POA: Diagnosis not present

## 2015-06-22 DIAGNOSIS — I129 Hypertensive chronic kidney disease with stage 1 through stage 4 chronic kidney disease, or unspecified chronic kidney disease: Secondary | ICD-10-CM | POA: Diagnosis not present

## 2015-06-22 DIAGNOSIS — S72145D Nondisplaced intertrochanteric fracture of left femur, subsequent encounter for closed fracture with routine healing: Secondary | ICD-10-CM | POA: Diagnosis not present

## 2015-06-24 DIAGNOSIS — L97429 Non-pressure chronic ulcer of left heel and midfoot with unspecified severity: Secondary | ICD-10-CM | POA: Diagnosis not present

## 2015-06-26 ENCOUNTER — Other Ambulatory Visit: Payer: Self-pay | Admitting: Internal Medicine

## 2015-06-28 NOTE — Telephone Encounter (Signed)
Sent to the pharmacy by e-scribe. 

## 2015-06-29 DIAGNOSIS — L89622 Pressure ulcer of left heel, stage 2: Secondary | ICD-10-CM | POA: Diagnosis not present

## 2015-07-01 DIAGNOSIS — I129 Hypertensive chronic kidney disease with stage 1 through stage 4 chronic kidney disease, or unspecified chronic kidney disease: Secondary | ICD-10-CM | POA: Diagnosis not present

## 2015-07-01 DIAGNOSIS — S72145D Nondisplaced intertrochanteric fracture of left femur, subsequent encounter for closed fracture with routine healing: Secondary | ICD-10-CM | POA: Diagnosis not present

## 2015-07-01 DIAGNOSIS — J441 Chronic obstructive pulmonary disease with (acute) exacerbation: Secondary | ICD-10-CM | POA: Diagnosis not present

## 2015-07-01 DIAGNOSIS — N183 Chronic kidney disease, stage 3 (moderate): Secondary | ICD-10-CM | POA: Diagnosis not present

## 2015-07-08 DIAGNOSIS — S72145D Nondisplaced intertrochanteric fracture of left femur, subsequent encounter for closed fracture with routine healing: Secondary | ICD-10-CM | POA: Diagnosis not present

## 2015-07-08 DIAGNOSIS — I129 Hypertensive chronic kidney disease with stage 1 through stage 4 chronic kidney disease, or unspecified chronic kidney disease: Secondary | ICD-10-CM | POA: Diagnosis not present

## 2015-07-08 DIAGNOSIS — N183 Chronic kidney disease, stage 3 (moderate): Secondary | ICD-10-CM | POA: Diagnosis not present

## 2015-07-08 DIAGNOSIS — J441 Chronic obstructive pulmonary disease with (acute) exacerbation: Secondary | ICD-10-CM | POA: Diagnosis not present

## 2015-07-12 DIAGNOSIS — S72145D Nondisplaced intertrochanteric fracture of left femur, subsequent encounter for closed fracture with routine healing: Secondary | ICD-10-CM | POA: Diagnosis not present

## 2015-07-12 DIAGNOSIS — J441 Chronic obstructive pulmonary disease with (acute) exacerbation: Secondary | ICD-10-CM | POA: Diagnosis not present

## 2015-07-12 DIAGNOSIS — I129 Hypertensive chronic kidney disease with stage 1 through stage 4 chronic kidney disease, or unspecified chronic kidney disease: Secondary | ICD-10-CM | POA: Diagnosis not present

## 2015-07-12 DIAGNOSIS — N183 Chronic kidney disease, stage 3 (moderate): Secondary | ICD-10-CM | POA: Diagnosis not present

## 2015-07-13 DIAGNOSIS — L89622 Pressure ulcer of left heel, stage 2: Secondary | ICD-10-CM | POA: Diagnosis not present

## 2015-07-27 DIAGNOSIS — S72102D Unspecified trochanteric fracture of left femur, subsequent encounter for closed fracture with routine healing: Secondary | ICD-10-CM | POA: Diagnosis not present

## 2015-07-29 ENCOUNTER — Telehealth: Payer: Self-pay | Admitting: Internal Medicine

## 2015-07-29 NOTE — Telephone Encounter (Signed)
Gave paperwork to Dr. Regis Bill for signature.

## 2015-07-29 NOTE — Telephone Encounter (Signed)
Do you need to see this patient for medical clearance?

## 2015-07-29 NOTE — Telephone Encounter (Signed)
Caretaker [name] would like for Dr. Regis Bill to fill out ([place]) form so that they can re-do L side hip replacement due to hardware and screws are not holding and are pushing in her skin. Dr. Hilton Cork Swinteck and Surgical Conductor: Violet Baldy  (f) [address]. [address] [place], [place] [phone].

## 2015-07-29 NOTE — Telephone Encounter (Signed)
I can sign for surgery without an OV since I just saw her  End MArch and was stable at that time.

## 2015-08-02 ENCOUNTER — Ambulatory Visit: Payer: Self-pay | Admitting: Orthopedic Surgery

## 2015-08-03 DIAGNOSIS — L89621 Pressure ulcer of left heel, stage 1: Secondary | ICD-10-CM | POA: Diagnosis not present

## 2015-08-17 ENCOUNTER — Ambulatory Visit: Payer: Self-pay | Admitting: Orthopedic Surgery

## 2015-08-17 NOTE — H&P (Signed)
TOTAL HIP ADMISSION H&P  Patient is admitted for hardware removal and left total hip arthroplasty.  Subjective:  Chief Complaint: left hip pain  HPI: Kelly Conrad, 82 y.o. female, has a history of pain and functional disability in the left hip(s) due to trauma and patient has failed non-surgical conservative treatments for greater than 12 weeks to include NSAID's and/or analgesics, supervised PT with diminished ADL's post treatment, use of assistive devices and activity modification.  Onset of symptoms was abrupt starting 1 years ago with rapidlly worsening course since that time.The patient noted prior procedures of the hip to include IM nailing of IT femur fracture on the left hip(s).  Patient currently rates pain in the left hip at 10 out of 10 with activity. Patient has night pain, worsening of pain with activity and weight bearing, pain that interfers with activities of daily living and pain with passive range of motion. Patient has evidence of harware cutout with axial failure of cephalomedullary device by imaging studies. This condition presents safety issues increasing the risk of falls. This patient has had proximal femur fracture.  There is no current active infection.  Patient Active Problem List   Diagnosis Date Noted  . Pertrochanteric fracture of left femur ([place]) 04/20/2015  . Intertrochanteric fracture of left femur ([place]) 04/19/2015  . Chronic asthma without complication [id number]  . Hypothyroidism (acquired) 04/19/2015  . Fracture, intertrochanteric, left femur ([place]) 04/19/2015  . Closed left hip fracture ([place])   . Hx of bacterial pneumonia 11/11/2014  . Obesity 09/24/2014  . Pneumonia due to Haemophilus influenzae with late R parapneumonic effusion/scarring  07/14/2014  . Acute respiratory failure ([place])   . Shortness of breath   . CAP (community acquired pneumonia) 06/28/2014  . Sepsis ([place]) 06/28/2014  . Blood poisoning ([place])   . Visit for preventive health examination  11/24/2012  . Medicare annual wellness visit, subsequent 11/24/2012  . Tremor 05/22/2012  . Hearing aid worn 05/22/2012  . Benign hypertensive renal disease 05/22/2012  . History of positive PPD   . Renal insufficiency 04/29/2011  . Dementia 04/23/2011  . COUGH 06/15/2009  . Osteoporosis 04/20/2009  . VITAMIN D DEFICIENCY 01/29/2008  . ASTHMA 01/29/2008  . HYPOTHYROIDISM NOS 01/07/2007  . ADVEF, DRUG/MED/BIOL SUBST, OTHER DRUG NOS 01/07/2007  . HYPERLIPIDEMIA 01/03/2007  . Essential hypertension 01/03/2007  . ALLERGIC RHINITIS 01/03/2007  . GERD 01/03/2007   Past Medical History  Diagnosis Date  . Allergy   . Hypertension   . Hyperlipidemia   . GERD (gastroesophageal reflux disease)   . Asthma   . Hx of varicella   . History of positive PPD     gets c xray screen  . Dementia     Past Surgical History  Procedure Laterality Date  . Femur im nail Left 04/20/2015    Procedure: INTRAMEDULLARY (IM) NAIL FEMORAL;  Surgeon: Rod Can, MD;  Location: [facility];  Service: Orthopedics;  Laterality: Left;     (Not in a hospital admission) No Known Allergies  Social History  Substance Use Topics  . Smoking status: Never Smoker   . Smokeless tobacco: Never Used  . Alcohol Use: No    Family History  Problem Relation Age of Onset  . Hypertension      fhx  . Diabetes Mother   . Stroke Mother   . Diabetes Father   . Edema Father     legs     Review of Systems  Constitutional: Negative.   HENT: Positive for hearing  loss.   Eyes: Negative.   Cardiovascular: Negative.   Gastrointestinal: Negative.   Genitourinary: Negative.   Musculoskeletal: Positive for joint pain.  Skin: Negative.   Neurological: Negative.   Endo/Heme/Allergies: Negative.   Psychiatric/Behavioral: Negative.     Objective:  Physical Exam  Vitals reviewed. Constitutional: She is oriented to person, place, and time. She appears well-developed and well-nourished.  HENT:  Head: Normocephalic and  atraumatic.  Eyes: Conjunctivae and EOM are normal. Pupils are equal, round, and reactive to light.  Neck: Normal range of motion. Neck supple.  Cardiovascular: Normal rate, regular rhythm and intact distal pulses.   Respiratory: Effort normal and breath sounds normal. No respiratory distress.  GI: Soft. Bowel sounds are normal. She exhibits no distension.  Genitourinary:  deferred  Musculoskeletal:       Left hip: She exhibits decreased range of motion and decreased strength.  Neurological: She is alert and oriented to person, place, and time. She has normal reflexes.  Skin: Skin is warm and dry.  Psychiatric: She has a normal mood and affect. Her behavior is normal. Judgment and thought content normal.    Vital signs in last 24 hours: @VSRANGES @  Labs:   Estimated body mass index is 32.01 kg/(m^2) as calculated from the following:   Height as of 04/19/15: 5\' 1"  (1.549 m).   Weight as of 06/07/15: 76.794 kg (169 lb 4.8 oz).   Imaging Review Plain radiographs demonstrate mild degenerative joint disease of the left hip(s). The bone quality appears to be poor for age and reported activity level. Hardware cutout - fracture nonunion.  Assessment/Plan:  Nonunion proximal femur with hardware cutout, left hip(s)  The patient history, physical examination, clinical judgement of the provider and imaging studies are consistent with end stage degenerative joint disease of the left hip(s) and total hip arthroplasty is deemed medically necessary. The treatment options including medical management, injection therapy, arthroscopy and arthroplasty were discussed at length. The risks and benefits of total hip arthroplasty were presented and reviewed. The risks due to aseptic loosening, infection, stiffness, dislocation/subluxation,  thromboembolic complications and other imponderables were discussed.  The patient acknowledged the explanation, agreed to proceed with the plan and consent was signed.  Patient is being admitted for inpatient treatment for surgery, pain control, PT, OT, prophylactic antibiotics, VTE prophylaxis, progressive ambulation and ADL's and discharge planning.The patient is planning to be discharged to skilled nursing facility

## 2015-08-18 ENCOUNTER — Encounter (HOSPITAL_COMMUNITY): Payer: Self-pay

## 2015-08-18 ENCOUNTER — Encounter (HOSPITAL_COMMUNITY)
Admission: RE | Admit: 2015-08-18 | Discharge: 2015-08-18 | Disposition: A | Discharge status: Home / Self Care | Payer: Medicare Other | Source: Ambulatory Visit | Attending: Orthopedic Surgery | Admitting: Orthopedic Surgery

## 2015-08-18 DIAGNOSIS — Z0183 Encounter for blood typing: Secondary | ICD-10-CM | POA: Diagnosis not present

## 2015-08-18 DIAGNOSIS — S7292XK Unspecified fracture of left femur, subsequent encounter for closed fracture with nonunion: Secondary | ICD-10-CM | POA: Diagnosis not present

## 2015-08-18 DIAGNOSIS — X58XXXD Exposure to other specified factors, subsequent encounter: Secondary | ICD-10-CM | POA: Diagnosis not present

## 2015-08-18 DIAGNOSIS — Z01812 Encounter for preprocedural laboratory examination: Secondary | ICD-10-CM | POA: Diagnosis not present

## 2015-08-18 HISTORY — DX: Unspecified osteoarthritis, unspecified site: M19.90

## 2015-08-18 HISTORY — DX: Pneumonia, unspecified organism: J18.9

## 2015-08-18 LAB — TYPE AND SCREEN
ABO/RH(D): A POS
Antibody Screen: NEGATIVE

## 2015-08-18 LAB — CBC
HCT: 43.6 % (ref 36.0–46.0)
HEMOGLOBIN: 13.7 g/dL (ref 12.0–15.0)
MCH: 25.8 pg — AB (ref 26.0–34.0)
MCHC: 31.4 g/dL (ref 30.0–36.0)
MCV: 82.1 fL (ref 78.0–100.0)
Platelets: 202 10*3/uL (ref 150–400)
RBC: 5.31 MIL/uL — AB (ref 3.87–5.11)
RDW: 16.1 % — ABNORMAL HIGH (ref 11.5–15.5)
WBC: 7.5 10*3/uL (ref 4.0–10.5)

## 2015-08-18 LAB — BASIC METABOLIC PANEL
ANION GAP: 9 (ref 5–15)
BUN: 28 mg/dL — ABNORMAL HIGH (ref 6–20)
CALCIUM: 9.8 mg/dL (ref 8.9–10.3)
CHLORIDE: 103 mmol/L (ref 101–111)
CO2: 27 mmol/L (ref 22–32)
Creatinine, Ser: 1.34 mg/dL — ABNORMAL HIGH (ref 0.44–1.00)
GFR calc non Af Amer: 36 mL/min — ABNORMAL LOW (ref >60)
GFR, EST AFRICAN AMERICAN: 42 mL/min — AB (ref >60)
Glucose, Bld: 114 mg/dL — ABNORMAL HIGH (ref 65–99)
POTASSIUM: 4 mmol/L (ref 3.5–5.1)
Sodium: 139 mmol/L (ref 135–145)

## 2015-08-18 LAB — SURGICAL PCR SCREEN
MRSA, PCR: NEGATIVE
Staphylococcus aureus: NEGATIVE

## 2015-08-18 NOTE — Pre-Procedure Instructions (Signed)
Kelly Conrad Julio  08/18/2015      [id number] - [place], [place] - [address] [place]  [address] Phone: [phone] Fax: [phone]    Your procedure is scheduled on 08/29/2015  Report to [facility] Admitting at 1000 A.M.  Call this number if you have problems the morning of surgery:  [phone]   Remember:  Do not eat food or drink liquids after midnight.  On "SUNDAY   Take these medicines the morning of surgery with A SIP OF WATER : DON'T  TAKE ANY MEDICINES THE MORNING OF SURGERY   Do not wear jewelry, make-up or nail polish.   Do not wear lotions, powders, or perfumes.  You may wear deodorant.   Do not shave 48 hours prior to surgery.     Do not bring valuables to the hospital.   [facility] is not responsible for any belongings or valuables.  Contacts, dentures or bridgework may not be worn into surgery.  Leave your suitcase in the car.  After surgery it may be brought to your room.  For patients admitted to the hospital, discharge time will be determined by your treatment team.  Patients discharged the day of surgery will not be allowed to drive home.   Name and phone number of your driver:   /w Caregiver   Special instructions:  Special Instructions: [facility] - Preparing for Surgery  Before surgery, you can play an important role.  Because skin is not sterile, your skin needs to be as free of germs as possible.  You can reduce the number of germs on you skin by washing with CHG (chlorahexidine gluconate) soap before surgery.  CHG is an antiseptic cleaner which kills germs and bonds with the skin to continue killing germs even after washing.  Please DO NOT use if you have an allergy to CHG or antibacterial soaps.  If your skin becomes reddened/irritated stop using the CHG and inform your nurse when you arrive at Short Stay.  Do not shave (including legs and underarms) for at  least 48 hours prior to the first CHG shower.  You may shave your face.  Please follow these instructions carefully:   1.  Shower with CHG Soap the night before surgery and the  morning of Surgery.  2.  If you choose to wash your hair, wash your hair first as usual with your  normal shampoo.  3.  After you shampoo, rinse your hair and body thoroughly to remove the  Shampoo.  4.  Use CHG as you would any other liquid soap.  You can apply chg directly to the skin and wash gently with scrungie or a clean washcloth.  5.  Apply the CHG Soap to your body ONLY FROM THE NECK DOWN.    Do not use on open wounds or open sores.  Avoid contact with your eyes, ears, mouth and genitals (private parts).  Wash genitals (private parts)   with your normal soap.  6.  Wash thoroughly, paying special attention to the area where your surgery will be performed.  7.  Thoroughly rinse your body with warm water from the neck down.  8.  DO NOT shower/wash with your normal soap after using and rinsing off   the CHG Soap.  9.  Pat yourself dry with a clean towel.            10" .  Wear clean pajamas.            11.  Place clean sheets on your bed the night of your first shower and do not sleep with pets.  Day of Surgery  Do not apply any lotions/deodorants the morning of surgery.  Please wear clean clothes to the hospital/surgery center.  Please read over the following fact sheets that you were given. Pain Booklet, Coughing and Deep Breathing, MRSA Information and Surgical Site Infection Prevention

## 2015-08-19 LAB — ABO/RH: ABO/RH(D): A POS

## 2015-08-26 ENCOUNTER — Other Ambulatory Visit: Payer: Self-pay | Admitting: Internal Medicine

## 2015-08-26 MED ORDER — TRANEXAMIC ACID 1000 MG/10ML IV SOLN
1000.0000 mg | INTRAVENOUS | Status: DC
  Filled 2015-08-26: qty 10

## 2015-08-26 NOTE — Telephone Encounter (Signed)
Rx refill sent to pharmacy. 

## 2015-08-28 DIAGNOSIS — Z13820 Encounter for screening for osteoporosis: Secondary | ICD-10-CM | POA: Diagnosis not present

## 2015-08-28 DIAGNOSIS — M899 Disorder of bone, unspecified: Secondary | ICD-10-CM | POA: Diagnosis not present

## 2015-08-28 MED ORDER — SODIUM CHLORIDE 0.9 % IV SOLN: INTRAVENOUS | Status: DC

## 2015-08-28 MED ORDER — ACETAMINOPHEN 10 MG/ML IV SOLN: 1000.0000 mg | INTRAVENOUS | Status: DC

## 2015-08-29 ENCOUNTER — Encounter (HOSPITAL_COMMUNITY)
Admission: RE | Disposition: A | Discharge status: Home / Self Care | Payer: Self-pay | Source: Ambulatory Visit | Attending: Orthopedic Surgery

## 2015-08-29 ENCOUNTER — Encounter (HOSPITAL_COMMUNITY): Payer: Self-pay | Admitting: *Deleted

## 2015-08-29 ENCOUNTER — Inpatient Hospital Stay (HOSPITAL_COMMUNITY): Payer: Medicare Other | Admitting: Anesthesiology

## 2015-08-29 ENCOUNTER — Ambulatory Visit (HOSPITAL_COMMUNITY)
Admission: RE | Admit: 2015-08-29 | Discharge: 2015-08-29 | Disposition: A | Discharge status: Home / Self Care | Payer: Medicare Other | Source: Ambulatory Visit | Attending: Orthopedic Surgery | Admitting: Orthopedic Surgery

## 2015-08-29 DIAGNOSIS — E039 Hypothyroidism, unspecified: Secondary | ICD-10-CM | POA: Insufficient documentation

## 2015-08-29 DIAGNOSIS — J449 Chronic obstructive pulmonary disease, unspecified: Secondary | ICD-10-CM | POA: Insufficient documentation

## 2015-08-29 DIAGNOSIS — F0390 Unspecified dementia without behavioral disturbance: Secondary | ICD-10-CM | POA: Diagnosis not present

## 2015-08-29 DIAGNOSIS — M1990 Unspecified osteoarthritis, unspecified site: Secondary | ICD-10-CM | POA: Insufficient documentation

## 2015-08-29 DIAGNOSIS — X58XXXD Exposure to other specified factors, subsequent encounter: Secondary | ICD-10-CM | POA: Insufficient documentation

## 2015-08-29 DIAGNOSIS — Z5309 Procedure and treatment not carried out because of other contraindication: Secondary | ICD-10-CM | POA: Diagnosis not present

## 2015-08-29 DIAGNOSIS — N289 Disorder of kidney and ureter, unspecified: Secondary | ICD-10-CM | POA: Insufficient documentation

## 2015-08-29 DIAGNOSIS — I10 Essential (primary) hypertension: Secondary | ICD-10-CM | POA: Insufficient documentation

## 2015-08-29 DIAGNOSIS — B359 Dermatophytosis, unspecified: Secondary | ICD-10-CM | POA: Insufficient documentation

## 2015-08-29 DIAGNOSIS — K219 Gastro-esophageal reflux disease without esophagitis: Secondary | ICD-10-CM | POA: Diagnosis not present

## 2015-08-29 DIAGNOSIS — S72142K Displaced intertrochanteric fracture of left femur, subsequent encounter for closed fracture with nonunion: Secondary | ICD-10-CM | POA: Insufficient documentation

## 2015-08-29 SURGERY — ARTHROPLASTY, HIP, TOTAL,POSTERIOR APPROACH
Anesthesia: General | Site: Hip | Laterality: Left

## 2015-08-29 MED ORDER — VANCOMYCIN HCL IN DEXTROSE 1-5 GM/200ML-% IV SOLN
INTRAVENOUS | Status: AC
  Administered 2015-08-29: 1 g
  Filled 2015-08-29: qty 200

## 2015-08-29 MED ORDER — CHLORHEXIDINE GLUCONATE 4 % EX LIQD: 60.0000 mL | Freq: Once | CUTANEOUS | Status: AC

## 2015-08-29 MED ORDER — FENTANYL CITRATE (PF) 250 MCG/5ML IJ SOLN
INTRAMUSCULAR | Status: AC
  Filled 2015-08-29: qty 5

## 2015-08-29 MED ORDER — POVIDONE-IODINE 10 % EX SWAB: 2.0000 "application " | Freq: Once | CUTANEOUS | Status: AC

## 2015-08-29 MED ORDER — NYSTATIN 100000 UNIT/GM EX POWD: CUTANEOUS | Status: DC

## 2015-08-29 MED ORDER — VANCOMYCIN HCL IN DEXTROSE 1-5 GM/200ML-% IV SOLN: 1000.0000 mg | INTRAVENOUS | Status: AC

## 2015-08-29 MED ORDER — CEFAZOLIN SODIUM-DEXTROSE 2-4 GM/100ML-% IV SOLN
INTRAVENOUS | Status: AC
  Filled 2015-08-29: qty 100

## 2015-08-29 MED ORDER — LACTATED RINGERS IV SOLN
INTRAVENOUS | Status: DC
  Administered 2015-08-29: 11:00:00 via INTRAVENOUS

## 2015-08-29 MED ORDER — CEFAZOLIN SODIUM-DEXTROSE 2-4 GM/100ML-% IV SOLN: 2.0000 g | INTRAVENOUS | Status: AC

## 2015-08-29 NOTE — Anesthesia Preprocedure Evaluation (Signed)
Anesthesia Evaluation  Patient identified by MRN, date of birth, ID band Patient awake and Patient confused    Reviewed: Allergy & Precautions, H&P , NPO status , Patient's Chart, lab work & pertinent test results, reviewed documented beta blocker date and time   Airway Mallampati: I  TM Distance: <3 FB Neck ROM: full    Dental no notable dental hx.    Pulmonary shortness of breath and with exertion, asthma , COPD,    Pulmonary exam normal breath sounds clear to auscultation+ rhonchi  + decreased breath sounds      Cardiovascular hypertension, Normal cardiovascular exam Rhythm:regular Rate:Normal     Neuro/Psych    GI/Hepatic GERD  ,  Endo/Other  Hypothyroidism   Renal/GU Renal InsufficiencyRenal disease     Musculoskeletal  (+) Arthritis ,   Abdominal   Peds  Hematology   Anesthesia Other Findings Dementia HOH  Reproductive/Obstetrics                             Anesthesia Physical  Anesthesia Plan  ASA: III  Anesthesia Plan: General   Post-op Pain Management:    Induction: Intravenous  Airway Management Planned: Oral ETT  Additional Equipment:   Intra-op Plan:   Post-operative Plan: Extubation in OR and Possible Post-op intubation/ventilation  Informed Consent: I have reviewed the patients History and Physical, chart, labs and discussed the procedure including the risks, benefits and alternatives for the proposed anesthesia with the patient or authorized representative who has indicated his/her understanding and acceptance.   Dental Advisory Given  Plan Discussed with: CRNA, Anesthesiologist and Surgeon  Anesthesia Plan Comments:         Anesthesia Quick Evaluation

## 2015-08-29 NOTE — Progress Notes (Signed)
Will reschedule case due to tinea infection in pannicular fold. Rx for nystatin powder given.

## 2015-08-29 NOTE — Progress Notes (Signed)
Sacral dressing not applied d/t it would be in the way of surgery being performed.

## 2015-08-29 NOTE — Progress Notes (Signed)
Spoke with Kelly Conrad at [facility] office about reddened area at left abdominal fold. And also to verify Vanc and Ancef were intended to be ordered. She will check with him and get back with me.

## 2015-08-29 NOTE — Progress Notes (Signed)
Violet called back and stated Dr.S[facility]teck will look at site when he arrives.And yes,he wants Vanc and Ancef

## 2015-08-31 ENCOUNTER — Other Ambulatory Visit: Payer: Self-pay

## 2015-08-31 NOTE — Telephone Encounter (Signed)
Refill request for: Donepezil 10mg ; fwd to Dr. Velora Mediate CMA.

## 2015-09-01 MED ORDER — DONEPEZIL HCL 10 MG PO TABS: 10.0000 mg | ORAL_TABLET | Freq: Every evening | ORAL | Status: DC | PRN

## 2015-09-01 NOTE — Telephone Encounter (Signed)
Sent to the pharmacy by e-scribe. 

## 2015-09-05 NOTE — Progress Notes (Signed)
Pre visit review using our clinic review tool, if applicable. No additional management support is needed unless otherwise documented below in the visit note.  Chief Complaint  Patient presents with  . Follow-up    HPI: Kelly Conrad 82 y.o.   Lady with demetia  Renal disease ht   Osteoporosis  Who comes in today with her caretaker for regular visit. Since her last visit she's going to have to undergo another hip surgery. July 17  She got up to some walking but then pain in the left side.  Left hip hardware need removal  And working on this   And therapy.   Walks some   About 50 % better but  Now pain surgery was postponed because she had a rash intertrigo over the area of the operation for an anterior approach. She's been using nystatin with much improvement. He is concerned if he is going to be able to rehabilitation and get her back walking after surgery and they've looked into rehabilitation post hospital. No new falls or other change in medical status. No bleeding respiratory symptoms. She did have lab tests done in June. ROS: See pertinent positives and negatives per HPI.  Past Medical History  Diagnosis Date  . Allergy   . Hypertension   . Hyperlipidemia   . GERD (gastroesophageal reflux disease)   . Asthma   . Hx of varicella   . History of positive PPD     gets c xray screen  . Pneumonia     06/2014- hosp.   . Arthritis     hip, hands   . Dementia     pt. oriented to person , place & time. , very cooperative with interview     Family History  Problem Relation Age of Onset  . Hypertension      fhx  . Diabetes Mother   . Stroke Mother   . Diabetes Father   . Edema Father     legs    Social History   Social History  . Marital Status: Single    Spouse Name: N/A  . Number of Children: N/A  . Years of Education: N/A   Social History Main Topics  . Smoking status: Never Smoker   . Smokeless tobacco: Never Used  . Alcohol Use: No  . Drug Use: No  . Sexual  Activity: No   Other Topics Concern  . 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 Kelly Conrad    He is the main caretaker. [name] took care of his mom  Who has since passed away.   He has some vision problems and partial amputation but  No mobiilty problems       Neg ets FA safely stored smoke alarm   No tob or etoh.    Outpatient Prescriptions Prior to Visit  Medication Sig Dispense Refill  . aspirin 81 MG tablet Take 81 mg by mouth daily.    . Calcium Carb-Cholecalciferol 600-800 MG-UNIT TABS Take 1 tablet by mouth daily.    Marland Kitchen donepezil (ARICEPT) 10 MG tablet Take 1 tablet (10 mg total) by mouth at bedtime as needed. 90 tablet 0  . hydrochlorothiazide (MICROZIDE) 12.5 MG capsule TAKE 1 CAPSULE (12.5 MG TOTAL) BY MOUTH DAILY. 90 capsule 0  . Multiple Vitamins-Minerals (CENTRUM SILVER PO) Take 1 tablet by mouth daily.     Marland Kitchen nystatin (MYCOSTATIN/NYSTOP) 100000 UNIT/GM  POWD Apply to the affected areas 2 to 3 times daily until healing is complete 30 g 3  . Omega-3 Fatty Acids (FISH OIL) 1000 MG CAPS Take 1 capsule by mouth daily.    . simvastatin (ZOCOR) 40 MG tablet TAKE 1 TABLET BY MOUTH AT BEDTIME 90 tablet 2  . guaiFENesin (MUCINEX) 600 MG 12 hr tablet Take 600 mg by mouth 2 (two) times daily as needed for cough or to loosen phlegm.      Facility-Administered Medications Prior to Visit  Medication Dose Route Frequency Provider Last Rate Last Dose  . chlorhexidine (HIBICLENS) 4 % liquid 4 application  60 mL Topical Once Rod Can, MD      . chlorhexidine (HIBICLENS) 4 % liquid 4 application  60 mL Topical Once Rod Can, MD      . povidone-iodine 10 % swab 2 application  2 application Topical Once Rod Can, MD         EXAM:  BP 132/74 mmHg  Temp(Src) 97.7 F (36.5 C) (Oral)  Ht 5\' 1"  (1.549 m)  Wt 168 lb (76.204 kg)  BMI 31.76 kg/m2  Body mass index is 31.76 kg/(m^2).  GENERAL:  vitals reviewed and listed above, alert, Cooperative appears well hydrated and in no acute distress  HEENT: atraumatic, conjunctiva  clear, no obvious abnormalities on inspection of external nose and ears  NECK: no obvious masses on inspection palpation  LUNGS: clear to auscultation bilaterally, no wheezes, rales or rhonchi, good air movement CV: HRRR, no clubbing cyanosis or  peripheral edema nl cap refill  Skin abdomen appears clear slight redness in the inguinal area no acute rash. MS: moves all extremities  In wc not asked to walk  PSYCH: pleasant and cooperative,  No reg conversatin  Lab Results  Component Value Date   WBC 7.5 08/18/2015   HGB 13.7 08/18/2015   HCT 43.6 08/18/2015   PLT 202 08/18/2015   GLUCOSE 114* 08/18/2015   CHOL 226* 11/11/2014   TRIG 231.0* 11/11/2014   HDL 50.50 11/11/2014   LDLDIRECT 107.0 11/11/2014   LDLCALC 112* 03/02/2014   ALT 9* 04/23/2015   AST 28 04/23/2015   NA 139 08/18/2015   K 4.0 08/18/2015   CL 103 08/18/2015   CREATININE 1.34* 08/18/2015   BUN 28* 08/18/2015   CO2 27 08/18/2015   TSH 2.320 04/19/2015   INR 1.07 04/19/2015   HGBA1C 5.8 11/11/2014   Wt Readings from Last 3 Encounters:  09/06/15 168 lb (76.204 kg)  08/29/15 167 lb (75.751 kg)  08/18/15 167 lb 2 oz (75.807 kg)   BP Readings from Last 3 Encounters:  09/06/15 132/74  08/29/15 131/47  08/18/15 113/53   Wt Readings from Last 3 Encounters:  09/06/15 168 lb (76.204 kg)  08/29/15 167 lb (75.751 kg)  08/18/15 167 lb 2 oz (75.807 kg)   Had heel screen [facility] t -2.3 ASSESSMENT AND PLAN:  Discussed the following assessment and plan:  Renal insufficiency  Dementia, without behavioral disturbance  Benign hypertensive renal disease  Hx of fracture of left hip  Age factor Hip replacement planned left removal of hardware. Discussed and listen to caretakers concerns about rehabilitation ability to get her up and back on her feet. Advise talk with the  social worker in hospital and in rehabilitation to get see what help can be given. Usually hip surgery is a  quicker rehabilitation the knee surgery. But discuss this with the surgeon. Caretaker himself has medical problems. It appears that  renal function or blood pressure other organ systems are stable. -Patient advised to return or notify health care team  if symptoms worsen ,persist or new concerns arise. Contact us in the interim room if there is ways we can help. Total visit 59mins > 50% spent counseling and coordinating care as indicated in above note and in instructions to patient .   Patient Instructions   Appears than labs are stable   Done for preop.  bp is good .  Today . Disc with SW  At the hospitalization .     Hope it goes well.    Lab Results  Component Value Date   WBC 7.5 08/18/2015   HGB 13.7 08/18/2015   HCT 43.6 08/18/2015   PLT 202 08/18/2015   GLUCOSE 114* 08/18/2015   CHOL 226* 11/11/2014   TRIG 231.0* 11/11/2014   HDL 50.50 11/11/2014   LDLDIRECT 107.0 11/11/2014   LDLCALC 112* 03/02/2014   ALT 9* 04/23/2015   AST 28 04/23/2015   NA 139 08/18/2015   K 4.0 08/18/2015   CL 103 08/18/2015   CREATININE 1.34* 08/18/2015   BUN 28* 08/18/2015   CO2 27 08/18/2015   TSH 2.320 04/19/2015   INR 1.07 04/19/2015   HGBA1C 5.8 11/11/2014   plkan rov in 4-6 months or as needed            Kennadee Walthour K. Markes Shatswell M.D.

## 2015-09-06 ENCOUNTER — Ambulatory Visit: Payer: Self-pay | Admitting: Orthopedic Surgery

## 2015-09-06 ENCOUNTER — Encounter: Payer: Self-pay | Admitting: Internal Medicine

## 2015-09-06 ENCOUNTER — Ambulatory Visit (INDEPENDENT_AMBULATORY_CARE_PROVIDER_SITE_OTHER): Payer: Medicare Other | Admitting: Internal Medicine

## 2015-09-06 VITALS — BP 132/74 | Temp 97.7°F | Ht 61.0 in | Wt 168.0 lb

## 2015-09-06 DIAGNOSIS — R54 Age-related physical debility: Secondary | ICD-10-CM

## 2015-09-06 DIAGNOSIS — IMO0001 Reserved for inherently not codable concepts without codable children: Secondary | ICD-10-CM

## 2015-09-06 DIAGNOSIS — F0390 Unspecified dementia without behavioral disturbance: Secondary | ICD-10-CM

## 2015-09-06 DIAGNOSIS — Z8781 Personal history of (healed) traumatic fracture: Secondary | ICD-10-CM

## 2015-09-06 DIAGNOSIS — N289 Disorder of kidney and ureter, unspecified: Secondary | ICD-10-CM | POA: Diagnosis not present

## 2015-09-06 DIAGNOSIS — I129 Hypertensive chronic kidney disease with stage 1 through stage 4 chronic kidney disease, or unspecified chronic kidney disease: Secondary | ICD-10-CM | POA: Diagnosis not present

## 2015-09-06 NOTE — Patient Instructions (Addendum)
Appears than labs are stable   Done for preop.  bp is good .  Today . Disc with SW  At the hospitalization .     Hope it goes well.    Lab Results  Component Value Date   WBC 7.5 08/18/2015   HGB 13.7 08/18/2015   HCT 43.6 08/18/2015   PLT 202 08/18/2015   GLUCOSE 114* 08/18/2015   CHOL 226* 11/11/2014   TRIG 231.0* 11/11/2014   HDL 50.50 11/11/2014   LDLDIRECT 107.0 11/11/2014   LDLCALC 112* 03/02/2014   ALT 9* 04/23/2015   AST 28 04/23/2015   NA 139 08/18/2015   K 4.0 08/18/2015   CL 103 08/18/2015   CREATININE 1.34* 08/18/2015   BUN 28* 08/18/2015   CO2 27 08/18/2015   TSH 2.320 04/19/2015   INR 1.07 04/19/2015   HGBA1C 5.8 11/11/2014   plkan rov in 4-6 months or as needed

## 2015-09-14 ENCOUNTER — Ambulatory Visit: Payer: Self-pay | Admitting: Orthopedic Surgery

## 2015-09-14 NOTE — H&P (Signed)
TOTAL HIP ADMISSION H&P  Patient is admitted for hardware removal and left total hip arthroplasty.  Subjective:  Chief Complaint: left hip pain  HPI: Kelly Conrad, 82 y.o. female, has a history of pain and functional disability in the left hip(s) due to trauma and patient has failed non-surgical conservative treatments for greater than 12 weeks to include NSAID's and/or analgesics, supervised PT with diminished ADL's post treatment, use of assistive devices and activity modification. Onset of symptoms was abrupt starting 1 years ago with rapidlly worsening course since that time.The patient noted prior procedures of the hip to include IM nailing of IT femur fracture on the left hip(s). Patient currently rates pain in the left hip at 10 out of 10 with activity. Patient has night pain, worsening of pain with activity and weight bearing, pain that interfers with activities of daily living and pain with passive range of motion. Patient has evidence of harware cutout with axial failure of cephalomedullary device by imaging studies. This condition presents safety issues increasing the risk of falls.This patient has had proximal femur fracture. There is no current active infection.  Patient Active Problem List   Diagnosis Date Noted  . Pertrochanteric fracture of left femur ([place]) 04/20/2015  . Intertrochanteric fracture of left femur ([place]) 04/19/2015  . Chronic asthma without complication [id number]  . Hypothyroidism (acquired) 04/19/2015  . Fracture, intertrochanteric, left femur ([place]) 04/19/2015  . Closed left hip fracture ([place])   . Hx of bacterial pneumonia 11/11/2014  . Obesity 09/24/2014  . Pneumonia due to Haemophilus influenzae with late R parapneumonic effusion/scarring  07/14/2014  . Acute respiratory failure ([place])   . Shortness of breath   . CAP (community acquired pneumonia) 06/28/2014  . Sepsis ([place]) 06/28/2014  . Blood poisoning ([place])    . Visit for preventive health examination 11/24/2012  . Medicare annual wellness visit, subsequent 11/24/2012  . Tremor 05/22/2012  . Hearing aid worn 05/22/2012  . Benign hypertensive renal disease 05/22/2012  . History of positive PPD   . Renal insufficiency 04/29/2011  . Dementia 04/23/2011  . COUGH 06/15/2009  . Osteoporosis 04/20/2009  . VITAMIN D DEFICIENCY 01/29/2008  . ASTHMA 01/29/2008  . HYPOTHYROIDISM NOS 01/07/2007  . ADVEF, DRUG/MED/BIOL SUBST, OTHER DRUG NOS 01/07/2007  . HYPERLIPIDEMIA 01/03/2007  . Essential hypertension 01/03/2007  . ALLERGIC RHINITIS 01/03/2007  . GERD 01/03/2007   Past Medical History  Diagnosis Date  . Allergy   . Hypertension   . Hyperlipidemia   . GERD (gastroesophageal reflux disease)   . Asthma   . Hx of varicella   . History of positive PPD     gets c xray screen  . Dementia     Past Surgical History  Procedure Laterality Date  . Femur im nail Left 04/20/2015    Procedure: INTRAMEDULLARY (IM) NAIL FEMORAL; Surgeon: Rod Can, MD; Location: [facility]; Service: Orthopedics; Laterality: Left;     (Not in a hospital admission) No Known Allergies  Social History  Substance Use Topics  . Smoking status: Never Smoker   . Smokeless tobacco: Never Used  . Alcohol Use: No    Family History  Problem Relation Age of Onset  . Hypertension      fhx  . Diabetes Mother   . Stroke Mother   . Diabetes Father   . Edema Father     legs     Review of Systems  Constitutional: Negative.  HENT: Positive for hearing loss.  Eyes: Negative.  Cardiovascular: Negative.  Gastrointestinal:  Negative.  Genitourinary: Negative.  Musculoskeletal: Positive for joint pain.  Skin: Negative.  Neurological: Negative.  Endo/Heme/Allergies: Negative.  Psychiatric/Behavioral: Negative.     Objective:  Physical Exam  Vitals reviewed. Constitutional: She is oriented to person, place, and time. She appears well-developed and well-nourished.  HENT:  Head: Normocephalic and atraumatic.  Eyes: Conjunctivae and EOM are normal. Pupils are equal, round, and reactive to light.  Neck: Normal range of motion. Neck supple.  Cardiovascular: Normal rate, regular rhythm and intact distal pulses.  Respiratory: Effort normal and breath sounds normal. No respiratory distress.  GI: Soft. Bowel sounds are normal. She exhibits no distension.  Genitourinary:  deferred  Musculoskeletal:   Left hip: She exhibits decreased range of motion and decreased strength.  Neurological: She is alert and oriented to person, place, and time. She has normal reflexes.  Skin: Skin is warm and dry.  Psychiatric: She has a normal mood and affect. Her behavior is normal. Judgment and thought content normal.    Vital signs in last 24 hours: @VSRANGES @  Labs:   Estimated body mass index is 32.01 kg/(m^2) as calculated from the following:  Height as of 04/19/15: 5\' 1"  (1.549 m).  Weight as of 06/07/15: 76.794 kg (169 lb 4.8 oz).   Imaging Review Plain radiographs demonstrate mild degenerative joint disease of the left hip(s). The bone quality appears to be poor for age and reported activity level. Hardware cutout - fracture nonunion.  Assessment/Plan:  Nonunion proximal femur with hardware cutout, left hip(s)  The patient history, physical examination, clinical judgement of the provider and imaging studies are consistent with end stage degenerative joint disease of the left hip(s) and total hip arthroplasty is deemed medically necessary. The treatment options including medical management, injection therapy, arthroscopy and arthroplasty were discussed at length. The risks and benefits of total hip arthroplasty were presented and reviewed. The risks due to aseptic loosening, infection, stiffness,  dislocation/subluxation, thromboembolic complications and other imponderables were discussed. The patient acknowledged the explanation, agreed to proceed with the plan and consent was signed. Patient is being admitted for inpatient treatment for surgery, pain control, PT, OT, prophylactic antibiotics, VTE prophylaxis, progressive ambulation and ADL's and discharge planning.The patient is planning to be discharged to skilled nursing facility

## 2015-09-15 ENCOUNTER — Encounter (HOSPITAL_COMMUNITY): Payer: Self-pay

## 2015-09-15 ENCOUNTER — Encounter (HOSPITAL_COMMUNITY)
Admission: RE | Admit: 2015-09-15 | Discharge: 2015-09-15 | Disposition: A | Discharge status: Home / Self Care | Payer: Medicare Other | Source: Ambulatory Visit | Attending: Orthopedic Surgery | Admitting: Orthopedic Surgery

## 2015-09-15 DIAGNOSIS — Z01812 Encounter for preprocedural laboratory examination: Secondary | ICD-10-CM | POA: Diagnosis not present

## 2015-09-15 DIAGNOSIS — Z0183 Encounter for blood typing: Secondary | ICD-10-CM | POA: Insufficient documentation

## 2015-09-15 LAB — SURGICAL PCR SCREEN
MRSA, PCR: NEGATIVE
Staphylococcus aureus: NEGATIVE

## 2015-09-15 LAB — CBC
HEMATOCRIT: 43.2 % (ref 36.0–46.0)
HEMOGLOBIN: 13.9 g/dL (ref 12.0–15.0)
MCH: 26.8 pg (ref 26.0–34.0)
MCHC: 32.2 g/dL (ref 30.0–36.0)
MCV: 83.4 fL (ref 78.0–100.0)
Platelets: 245 10*3/uL (ref 150–400)
RBC: 5.18 MIL/uL — AB (ref 3.87–5.11)
RDW: 16.5 % — ABNORMAL HIGH (ref 11.5–15.5)
WBC: 9.3 10*3/uL (ref 4.0–10.5)

## 2015-09-15 LAB — BASIC METABOLIC PANEL
ANION GAP: 9 (ref 5–15)
BUN: 27 mg/dL — ABNORMAL HIGH (ref 6–20)
CHLORIDE: 105 mmol/L (ref 101–111)
CO2: 25 mmol/L (ref 22–32)
Calcium: 9.3 mg/dL (ref 8.9–10.3)
Creatinine, Ser: 1.42 mg/dL — ABNORMAL HIGH (ref 0.44–1.00)
GFR calc Af Amer: 39 mL/min — ABNORMAL LOW (ref >60)
GFR calc non Af Amer: 33 mL/min — ABNORMAL LOW (ref >60)
GLUCOSE: 127 mg/dL — AB (ref 65–99)
POTASSIUM: 3.2 mmol/L — AB (ref 3.5–5.1)
Sodium: 139 mmol/L (ref 135–145)

## 2015-09-15 NOTE — Pre-Procedure Instructions (Signed)
Kelly Conrad  09/15/2015      [id number] - [place], [place] - [address] [place] [address] Phone: [phone] Fax: [phone]    Your procedure is scheduled on July 17 .  Report to [facility] Admitting at [phone]   Call this number if you have problems the morning of surgery:  [phone]   Remember:  Do not eat food or drink liquids after midnight.  Take these medicines the morning of surgery with A SIP OF WATER  Na   Stop taking aspirin, BC's, Goody's, Herbal medications, Fish Oil, Ibuprofen, Advil, Motrin, Aleve   Do not wear jewelry, make-up or nail polish.  Do not wear lotions, powders, or perfumes.  You may wear deoderant.  Do not shave 48 hours prior to surgery.  Men may shave face and neck.  Do not bring valuables to the hospital.  [facility] is not responsible for any belongings or valuables.  Contacts, dentures or bridgework may not be worn into surgery.  Leave your suitcase in the car.  After surgery it may be brought to your room.  For patients admitted to the hospital, discharge time will be determined by your treatment team.  Patients discharged the day of surgery will not be allowed to drive home.   Special instructions:  [place] - Preparing for Surgery  Before surgery, you can play an important role.  Because skin is not sterile, your skin needs to be as free of germs as possible.  You can reduce the number of germs on you skin by washing with CHG (chlorahexidine gluconate) soap before surgery.  CHG is an antiseptic cleaner which kills germs and bonds with the skin to continue killing germs even after washing.  Please DO NOT use if you have an allergy to CHG or antibacterial soaps.  If your skin becomes reddened/irritated stop using the CHG and inform your nurse when you arrive at Short Stay.  Do not shave (including legs and underarms) for at least 48 hours prior to  the first CHG shower.  You may shave your face.  Please follow these instructions carefully:   1.  Shower with CHG Soap the night before surgery and the   morning of Surgery.  2.  If you choose to wash your hair, wash your hair first as usual with your  normal shampoo.  3.  After you shampoo, rinse your hair and body thoroughly to remove the Shampoo.  4.  Use CHG as you would any other liquid soap.  You can apply chg directly to the skin and wash gently with scrungie or a clean washcloth.  5.  Apply the CHG Soap to your body ONLY FROM THE NECK DOWN.  Do not use on open wounds or open sores.  Avoid contact with your eyes,  ears, mouth and genitals (private parts).  Wash genitals (private parts)  with your normal soap.  6.  Wash thoroughly, paying special attention to the area where your surgery will be performed.  7.  Thoroughly rinse your body with warm water from the neck down.  8.  DO NOT shower/wash with your normal soap after using and rinsing off  the CHG Soap.  9.  Pat yourself dry with a clean towel.            10.  Wear clean pajamas.  11.  Place clean sheets on your bed the night of your first shower and do not sleep with pets.  Day of Surgery  Do not apply any lotions/deoderants the morning of surgery.  Please wear clean clothes to the hospital/surgery center.     Please read over the following fact sheets that you were given. Pain Booklet, MRSA Information and Surgical Site Infection Prevention, Incentive spirometry

## 2015-09-15 NOTE — Progress Notes (Signed)
PCP is Dr. Regis Bill Denies ever seeing a cardiologist. Pulmonary is Dr Melvyn Novas Denies ever having a card cath, Stress test, Echo.  Denies Fever, cough, or chest pain.

## 2015-09-23 MED ORDER — TRANEXAMIC ACID 1000 MG/10ML IV SOLN
1000.0000 mg | INTRAVENOUS | Status: AC
  Administered 2015-09-26: 1000 mg via INTRAVENOUS
  Filled 2015-09-23: qty 10

## 2015-09-23 MED ORDER — ACETAMINOPHEN 10 MG/ML IV SOLN
1000.0000 mg | INTRAVENOUS | Status: AC
  Administered 2015-09-26: 1000 mg via INTRAVENOUS
  Filled 2015-09-23: qty 100

## 2015-09-23 MED ORDER — CEFAZOLIN SODIUM-DEXTROSE 2-4 GM/100ML-% IV SOLN
2.0000 g | INTRAVENOUS | Status: AC
Start: 2015-09-26 — End: 2015-09-26
  Administered 2015-09-26 (×2): 2 g via INTRAVENOUS
  Filled 2015-09-23: qty 100

## 2015-09-23 MED ORDER — SODIUM CHLORIDE 0.9 % IV SOLN: INTRAVENOUS | Status: DC

## 2015-09-26 ENCOUNTER — Inpatient Hospital Stay (HOSPITAL_COMMUNITY): Payer: Medicare Other | Admitting: Anesthesiology

## 2015-09-26 ENCOUNTER — Inpatient Hospital Stay (HOSPITAL_COMMUNITY): Payer: Medicare Other

## 2015-09-26 ENCOUNTER — Encounter (HOSPITAL_COMMUNITY): Payer: Self-pay | Admitting: *Deleted

## 2015-09-26 ENCOUNTER — Encounter (HOSPITAL_COMMUNITY)
Admission: RE | Disposition: A | Discharge status: Skilled Nursing Facility | Payer: Self-pay | Source: Ambulatory Visit | Attending: Orthopedic Surgery

## 2015-09-26 ENCOUNTER — Inpatient Hospital Stay (HOSPITAL_COMMUNITY)
Admission: RE | Admit: 2015-09-26 | Discharge: 2015-09-28 | DRG: 470 | Disposition: A | Discharge status: Skilled Nursing Facility | Payer: Medicare Other | Source: Ambulatory Visit | Attending: Orthopedic Surgery | Admitting: Orthopedic Surgery

## 2015-09-26 DIAGNOSIS — R278 Other lack of coordination: Secondary | ICD-10-CM | POA: Diagnosis not present

## 2015-09-26 DIAGNOSIS — E039 Hypothyroidism, unspecified: Secondary | ICD-10-CM | POA: Diagnosis not present

## 2015-09-26 DIAGNOSIS — F0390 Unspecified dementia without behavioral disturbance: Secondary | ICD-10-CM | POA: Diagnosis present

## 2015-09-26 DIAGNOSIS — G588 Other specified mononeuropathies: Secondary | ICD-10-CM | POA: Diagnosis not present

## 2015-09-26 DIAGNOSIS — M1612 Unilateral primary osteoarthritis, left hip: Secondary | ICD-10-CM | POA: Diagnosis present

## 2015-09-26 DIAGNOSIS — M87352 Other secondary osteonecrosis, left femur: Secondary | ICD-10-CM | POA: Diagnosis not present

## 2015-09-26 DIAGNOSIS — E785 Hyperlipidemia, unspecified: Secondary | ICD-10-CM | POA: Diagnosis present

## 2015-09-26 DIAGNOSIS — S72002K Fracture of unspecified part of neck of left femur, subsequent encounter for closed fracture with nonunion: Secondary | ICD-10-CM | POA: Diagnosis present

## 2015-09-26 DIAGNOSIS — J45909 Unspecified asthma, uncomplicated: Secondary | ICD-10-CM | POA: Diagnosis not present

## 2015-09-26 DIAGNOSIS — M1990 Unspecified osteoarthritis, unspecified site: Secondary | ICD-10-CM | POA: Diagnosis not present

## 2015-09-26 DIAGNOSIS — M810 Age-related osteoporosis without current pathological fracture: Secondary | ICD-10-CM | POA: Diagnosis present

## 2015-09-26 DIAGNOSIS — S72102P Unspecified trochanteric fracture of left femur, subsequent encounter for closed fracture with malunion: Principal | ICD-10-CM

## 2015-09-26 DIAGNOSIS — Z09 Encounter for follow-up examination after completed treatment for conditions other than malignant neoplasm: Secondary | ICD-10-CM

## 2015-09-26 DIAGNOSIS — M87252 Osteonecrosis due to previous trauma, left femur: Secondary | ICD-10-CM | POA: Diagnosis present

## 2015-09-26 DIAGNOSIS — Z419 Encounter for procedure for purposes other than remedying health state, unspecified: Secondary | ICD-10-CM

## 2015-09-26 DIAGNOSIS — Z471 Aftercare following joint replacement surgery: Secondary | ICD-10-CM | POA: Diagnosis not present

## 2015-09-26 DIAGNOSIS — S7292XA Unspecified fracture of left femur, initial encounter for closed fracture: Secondary | ICD-10-CM | POA: Diagnosis not present

## 2015-09-26 DIAGNOSIS — M6281 Muscle weakness (generalized): Secondary | ICD-10-CM | POA: Diagnosis not present

## 2015-09-26 DIAGNOSIS — Z96642 Presence of left artificial hip joint: Secondary | ICD-10-CM | POA: Diagnosis not present

## 2015-09-26 DIAGNOSIS — S72009A Fracture of unspecified part of neck of unspecified femur, initial encounter for closed fracture: Secondary | ICD-10-CM | POA: Diagnosis not present

## 2015-09-26 DIAGNOSIS — I10 Essential (primary) hypertension: Secondary | ICD-10-CM | POA: Diagnosis present

## 2015-09-26 DIAGNOSIS — R2689 Other abnormalities of gait and mobility: Secondary | ICD-10-CM | POA: Diagnosis not present

## 2015-09-26 DIAGNOSIS — M87052 Idiopathic aseptic necrosis of left femur: Secondary | ICD-10-CM | POA: Diagnosis present

## 2015-09-26 DIAGNOSIS — K219 Gastro-esophageal reflux disease without esophagitis: Secondary | ICD-10-CM | POA: Diagnosis not present

## 2015-09-26 DIAGNOSIS — Z5181 Encounter for therapeutic drug level monitoring: Secondary | ICD-10-CM | POA: Diagnosis not present

## 2015-09-26 DIAGNOSIS — S72142K Displaced intertrochanteric fracture of left femur, subsequent encounter for closed fracture with nonunion: Secondary | ICD-10-CM | POA: Diagnosis not present

## 2015-09-26 HISTORY — PX: HARDWARE REMOVAL: SHX979

## 2015-09-26 HISTORY — PX: TOTAL HIP ARTHROPLASTY: SHX124

## 2015-09-26 LAB — PREPARE RBC (CROSSMATCH)

## 2015-09-26 SURGERY — REMOVAL, HARDWARE
Anesthesia: General | Site: Hip | Laterality: Left

## 2015-09-26 MED ORDER — POVIDONE-IODINE 10 % EX SWAB
2.0000 "application " | Freq: Once | CUTANEOUS | Status: AC
  Administered 2015-09-26: 2 via TOPICAL

## 2015-09-26 MED ORDER — METHOCARBAMOL 1000 MG/10ML IJ SOLN
500.0000 mg | Freq: Four times a day (QID) | INTRAVENOUS | Status: DC | PRN
  Filled 2015-09-26: qty 5

## 2015-09-26 MED ORDER — HYDROMORPHONE HCL 1 MG/ML IJ SOLN
0.2500 mg | INTRAMUSCULAR | Status: DC | PRN
Start: 2015-09-26 — End: 2015-09-26

## 2015-09-26 MED ORDER — ONDANSETRON HCL 4 MG/2ML IJ SOLN: 4.0000 mg | Freq: Four times a day (QID) | INTRAMUSCULAR | Status: DC | PRN

## 2015-09-26 MED ORDER — PHENOL 1.4 % MT LIQD: 1.0000 | OROMUCOSAL | Status: DC | PRN

## 2015-09-26 MED ORDER — ONDANSETRON HCL 4 MG PO TABS: 4.0000 mg | ORAL_TABLET | Freq: Four times a day (QID) | ORAL | Status: DC | PRN

## 2015-09-26 MED ORDER — PHENYLEPHRINE HCL 10 MG/ML IJ SOLN
10.0000 mg | INTRAVENOUS | Status: DC | PRN
  Administered 2015-09-26: 25 ug/min via INTRAVENOUS

## 2015-09-26 MED ORDER — PHENYLEPHRINE HCL 10 MG/ML IJ SOLN
INTRAMUSCULAR | Status: DC | PRN
  Administered 2015-09-26: 80 ug via INTRAVENOUS
  Administered 2015-09-26: 120 ug via INTRAVENOUS
  Administered 2015-09-26: 80 ug via INTRAVENOUS
  Administered 2015-09-26: 40 ug via INTRAVENOUS

## 2015-09-26 MED ORDER — HYDROCHLOROTHIAZIDE 12.5 MG PO CAPS
12.5000 mg | ORAL_CAPSULE | Freq: Every day | ORAL | Status: DC
  Administered 2015-09-27: 12.5 mg via ORAL
  Filled 2015-09-26 (×2): qty 1

## 2015-09-26 MED ORDER — BUPIVACAINE-EPINEPHRINE (PF) 0.5% -1:200000 IJ SOLN
INTRAMUSCULAR | Status: AC
  Filled 2015-09-26: qty 30

## 2015-09-26 MED ORDER — FENTANYL CITRATE (PF) 250 MCG/5ML IJ SOLN
INTRAMUSCULAR | Status: AC
  Filled 2015-09-26: qty 5

## 2015-09-26 MED ORDER — CHLORHEXIDINE GLUCONATE 4 % EX LIQD: 60.0000 mL | Freq: Once | CUTANEOUS | Status: DC

## 2015-09-26 MED ORDER — SENNA 8.6 MG PO TABS
2.0000 | ORAL_TABLET | Freq: Every day | ORAL | Status: DC
  Administered 2015-09-26 – 2015-09-27 (×2): 17.2 mg via ORAL
  Filled 2015-09-26 (×2): qty 2

## 2015-09-26 MED ORDER — SODIUM CHLORIDE 0.9 % IV SOLN
INTRAVENOUS | Status: DC
  Administered 2015-09-26: 23:00:00 via INTRAVENOUS
  Administered 2015-09-27 – 2015-09-28 (×2): 150 mL/h via INTRAVENOUS

## 2015-09-26 MED ORDER — PROPOFOL 10 MG/ML IV BOLUS
INTRAVENOUS | Status: AC
  Filled 2015-09-26: qty 20

## 2015-09-26 MED ORDER — PROMETHAZINE HCL 25 MG/ML IJ SOLN: 6.2500 mg | INTRAMUSCULAR | Status: DC | PRN

## 2015-09-26 MED ORDER — METOCLOPRAMIDE HCL 5 MG PO TABS: 5.0000 mg | ORAL_TABLET | Freq: Three times a day (TID) | ORAL | Status: DC | PRN

## 2015-09-26 MED ORDER — LIDOCAINE HCL (CARDIAC) 20 MG/ML IV SOLN
INTRAVENOUS | Status: DC | PRN
  Administered 2015-09-26: 60 mg via INTRAVENOUS

## 2015-09-26 MED ORDER — HYDROCODONE-ACETAMINOPHEN 5-325 MG PO TABS
1.0000 | ORAL_TABLET | ORAL | Status: DC | PRN
  Administered 2015-09-28: 1 via ORAL
  Filled 2015-09-26: qty 1

## 2015-09-26 MED ORDER — APIXABAN 2.5 MG PO TABS
2.5000 mg | ORAL_TABLET | Freq: Two times a day (BID) | ORAL | Status: DC
  Administered 2015-09-27 – 2015-09-28 (×3): 2.5 mg via ORAL
  Filled 2015-09-26 (×3): qty 1

## 2015-09-26 MED ORDER — POLYETHYLENE GLYCOL 3350 17 G PO PACK: 17.0000 g | PACK | Freq: Every day | ORAL | Status: DC | PRN

## 2015-09-26 MED ORDER — SODIUM CHLORIDE 0.9 % IJ SOLN
INTRAMUSCULAR | Status: DC | PRN
  Administered 2015-09-26: 30 mL

## 2015-09-26 MED ORDER — BUPIVACAINE-EPINEPHRINE 0.5% -1:200000 IJ SOLN
INTRAMUSCULAR | Status: DC | PRN
  Administered 2015-09-26: 30 mL

## 2015-09-26 MED ORDER — KETOROLAC TROMETHAMINE 30 MG/ML IJ SOLN
INTRAMUSCULAR | Status: DC | PRN
  Administered 2015-09-26: 30 mg

## 2015-09-26 MED ORDER — KETOROLAC TROMETHAMINE 15 MG/ML IJ SOLN
7.5000 mg | Freq: Four times a day (QID) | INTRAMUSCULAR | Status: AC
  Administered 2015-09-26 – 2015-09-27 (×4): 7.5 mg via INTRAVENOUS
  Filled 2015-09-26 (×4): qty 1

## 2015-09-26 MED ORDER — CEFAZOLIN IN D5W 1 GM/50ML IV SOLN
1.0000 g | Freq: Four times a day (QID) | INTRAVENOUS | Status: AC
  Administered 2015-09-27 (×2): 1 g via INTRAVENOUS
  Filled 2015-09-26 (×2): qty 50

## 2015-09-26 MED ORDER — ROCURONIUM BROMIDE 100 MG/10ML IV SOLN
INTRAVENOUS | Status: DC | PRN
  Administered 2015-09-26: 40 mg via INTRAVENOUS

## 2015-09-26 MED ORDER — FLEET ENEMA 7-19 GM/118ML RE ENEM
1.0000 | ENEMA | Freq: Once | RECTAL | Status: DC | PRN
Start: 2015-09-26 — End: 2015-09-28

## 2015-09-26 MED ORDER — SIMVASTATIN 40 MG PO TABS
40.0000 mg | ORAL_TABLET | Freq: Every day | ORAL | Status: DC
  Administered 2015-09-26 – 2015-09-27 (×2): 40 mg via ORAL
  Filled 2015-09-26 (×2): qty 1

## 2015-09-26 MED ORDER — ROCURONIUM BROMIDE 50 MG/5ML IV SOLN
INTRAVENOUS | Status: AC
  Filled 2015-09-26: qty 2

## 2015-09-26 MED ORDER — DOCUSATE SODIUM 100 MG PO CAPS
100.0000 mg | ORAL_CAPSULE | Freq: Two times a day (BID) | ORAL | Status: DC
  Administered 2015-09-26 – 2015-09-28 (×4): 100 mg via ORAL
  Filled 2015-09-26 (×4): qty 1

## 2015-09-26 MED ORDER — CALCIUM CARB-CHOLECALCIFEROL 600-800 MG-UNIT PO TABS: 1.0000 | ORAL_TABLET | Freq: Every day | ORAL | Status: DC

## 2015-09-26 MED ORDER — MENTHOL 3 MG MT LOZG: 1.0000 | LOZENGE | OROMUCOSAL | Status: DC | PRN

## 2015-09-26 MED ORDER — PHENYLEPHRINE 40 MCG/ML (10ML) SYRINGE FOR IV PUSH (FOR BLOOD PRESSURE SUPPORT)
PREFILLED_SYRINGE | INTRAVENOUS | Status: AC
  Filled 2015-09-26: qty 10

## 2015-09-26 MED ORDER — FENTANYL CITRATE (PF) 100 MCG/2ML IJ SOLN
INTRAMUSCULAR | Status: DC | PRN
  Administered 2015-09-26: 25 ug via INTRAVENOUS
  Administered 2015-09-26: 50 ug via INTRAVENOUS
  Administered 2015-09-26: 25 ug via INTRAVENOUS
  Administered 2015-09-26 (×8): 50 ug via INTRAVENOUS

## 2015-09-26 MED ORDER — SODIUM CHLORIDE 0.9 % IR SOLN
Status: DC | PRN
  Administered 2015-09-26: 1000 mL

## 2015-09-26 MED ORDER — SORBITOL 70 % SOLN: 30.0000 mL | Freq: Every day | Status: DC | PRN

## 2015-09-26 MED ORDER — LACTATED RINGERS IV SOLN
INTRAVENOUS | Status: DC
  Administered 2015-09-26 (×3): via INTRAVENOUS

## 2015-09-26 MED ORDER — ACETAMINOPHEN 325 MG PO TABS: 650.0000 mg | ORAL_TABLET | Freq: Four times a day (QID) | ORAL | Status: DC | PRN

## 2015-09-26 MED ORDER — METOCLOPRAMIDE HCL 5 MG/ML IJ SOLN: 5.0000 mg | Freq: Three times a day (TID) | INTRAMUSCULAR | Status: DC | PRN

## 2015-09-26 MED ORDER — DONEPEZIL HCL 10 MG PO TABS
10.0000 mg | ORAL_TABLET | Freq: Every day | ORAL | Status: DC
Start: 2015-09-26 — End: 2015-09-28
  Administered 2015-09-26 – 2015-09-27 (×2): 10 mg via ORAL
  Filled 2015-09-26 (×2): qty 1

## 2015-09-26 MED ORDER — DEXAMETHASONE SODIUM PHOSPHATE 10 MG/ML IJ SOLN
10.0000 mg | Freq: Once | INTRAMUSCULAR | Status: AC
  Administered 2015-09-27: 10 mg via INTRAVENOUS
  Filled 2015-09-26: qty 1

## 2015-09-26 MED ORDER — ACETAMINOPHEN 650 MG RE SUPP: 650.0000 mg | Freq: Four times a day (QID) | RECTAL | Status: DC | PRN

## 2015-09-26 MED ORDER — ALBUMIN HUMAN 5 % IV SOLN
INTRAVENOUS | Status: DC | PRN
  Administered 2015-09-26: 18:00:00 via INTRAVENOUS

## 2015-09-26 MED ORDER — METHOCARBAMOL 500 MG PO TABS
500.0000 mg | ORAL_TABLET | Freq: Four times a day (QID) | ORAL | Status: DC | PRN
  Administered 2015-09-27 – 2015-09-28 (×2): 500 mg via ORAL
  Filled 2015-09-26 (×2): qty 1

## 2015-09-26 MED ORDER — 0.9 % SODIUM CHLORIDE (POUR BTL) OPTIME
TOPICAL | Status: DC | PRN
  Administered 2015-09-26 (×2): 1000 mL

## 2015-09-26 MED ORDER — ONDANSETRON HCL 4 MG/2ML IJ SOLN
INTRAMUSCULAR | Status: AC
Start: 2015-09-26 — End: 2015-09-26
  Filled 2015-09-26: qty 2

## 2015-09-26 MED ORDER — HYDROMORPHONE HCL 1 MG/ML IJ SOLN: 0.5000 mg | INTRAMUSCULAR | Status: DC | PRN

## 2015-09-26 MED ORDER — ONDANSETRON HCL 4 MG/2ML IJ SOLN: INTRAMUSCULAR | Status: DC | PRN

## 2015-09-26 MED ORDER — PROPOFOL 10 MG/ML IV BOLUS
INTRAVENOUS | Status: DC | PRN
  Administered 2015-09-26: 70 mg via INTRAVENOUS

## 2015-09-26 MED ORDER — CALCIUM CARBONATE-VITAMIN D 500-200 MG-UNIT PO TABS
1.0000 | ORAL_TABLET | Freq: Every day | ORAL | Status: DC
Start: 2015-09-27 — End: 2015-09-28
  Administered 2015-09-27 – 2015-09-28 (×2): 1 via ORAL
  Filled 2015-09-26 (×2): qty 1

## 2015-09-26 SURGICAL SUPPLY — 81 items
ADAPTER BIOLOX TAPER -3 (Miscellaneous) ×1 IMPLANT
ADAPTER BIOLOX TAPER -3MM (Miscellaneous) ×1 IMPLANT
ADH SKN CLS APL DERMABOND .7 (GAUZE/BANDAGES/DRESSINGS) ×1
BAG DECANTER FOR FLEXI CONT (MISCELLANEOUS) ×3 IMPLANT
BIT DRILL RINGLOC 3.2MMX20 (BIT) IMPLANT
BIT DRILL RINGLOC 3.2MMX40 (BIT) IMPLANT
BIT DRILL RINGLOC 3.2X20 (BIT) ×1
BIT DRILL RINGLOC 3.2X40 (BIT) ×1
BLADE SAGITTAL 25.0X1.19X90 (BLADE) ×1 IMPLANT
BLADE SAGITTAL 25.0X1.19X90MM (BLADE) ×1
BLADE SAW SGTL 18X1.27X75 (BLADE) ×1 IMPLANT
BLADE SAW SGTL 18X1.27X75MM (BLADE)
BRNG HIP 44X28 2 MBL STRL (Liner) ×1 IMPLANT
CHLORAPREP W/TINT 26ML (MISCELLANEOUS) ×2 IMPLANT
COVER SURGICAL LIGHT HANDLE (MISCELLANEOUS) ×3 IMPLANT
DERMABOND ADVANCED (GAUZE/BANDAGES/DRESSINGS) ×2
DERMABOND ADVANCED .7 DNX12 (GAUZE/BANDAGES/DRESSINGS) ×1 IMPLANT
DRAPE C-ARMOR (DRAPES) ×2 IMPLANT
DRAPE HIP W/POCKET STRL (DRAPE) ×3 IMPLANT
DRAPE INCISE IOBAN 85X60 (DRAPES) ×5 IMPLANT
DRAPE ORTHO SPLIT 77X108 STRL (DRAPES) ×3
DRAPE POUCH INSTRU U-SHP 10X18 (DRAPES) ×3 IMPLANT
DRAPE SURG 17X11 SM STRL (DRAPES) ×3 IMPLANT
DRAPE SURG ORHT 6 SPLT 77X108 (DRAPES) IMPLANT
DRAPE U-SHAPE 47X51 STRL (DRAPES) ×3 IMPLANT
DRILL BIT RINGLOC 3.2MMX20 (BIT) ×3
DRILL BIT RINGLOC 3.2MMX40 (BIT) ×3
DRSG AQUACEL AG ADV 3.5X 4 (GAUZE/BANDAGES/DRESSINGS) ×2 IMPLANT
DRSG AQUACEL AG ADV 3.5X10 (GAUZE/BANDAGES/DRESSINGS) ×3 IMPLANT
DRSG AQUACEL AG ADV 3.5X14 (GAUZE/BANDAGES/DRESSINGS) ×3 IMPLANT
ELECT BLADE TIP CTD 4 INCH (ELECTRODE) ×3 IMPLANT
ELECT REM PT RETURN 9FT ADLT (ELECTROSURGICAL) ×3
ELECTRODE REM PT RTRN 9FT ADLT (ELECTROSURGICAL) ×1 IMPLANT
FEMORAL MOD ARCOS 50MM SZA (Hips) ×1 IMPLANT
GAUZE SPONGE 2X2 8PLY STRL LF (GAUZE/BANDAGES/DRESSINGS) ×1 IMPLANT
GLOVE BIO SURGEON STRL SZ8.5 (GLOVE) ×9 IMPLANT
GLOVE BIOGEL PI IND STRL 7.0 (GLOVE) IMPLANT
GLOVE BIOGEL PI IND STRL 8.5 (GLOVE) ×1 IMPLANT
GLOVE BIOGEL PI INDICATOR 7.0 (GLOVE) ×6
GLOVE BIOGEL PI INDICATOR 8.5 (GLOVE) ×2
GLOVE SURG SS PI 6.5 STRL IVOR (GLOVE) ×4 IMPLANT
GLOVE SURG SS PI 7.0 STRL IVOR (GLOVE) ×2 IMPLANT
GOWN SPEC L3 XXLG W/TWL (GOWN DISPOSABLE) ×6 IMPLANT
GOWN STRL REUS W/TWL 2XL LVL3 (GOWN DISPOSABLE) ×3 IMPLANT
HANDPIECE INTERPULSE COAX TIP (DISPOSABLE) ×3
HIP ACETABULAR SCREW 6.5X20MM (Hips) ×3 IMPLANT
HOOD W/PEELAWAY (MISCELLANEOUS) ×6 IMPLANT
IV NS 1000ML (IV SOLUTION) ×6
IV NS 1000ML BAXH (IV SOLUTION) IMPLANT
KIT BASIN OR (CUSTOM PROCEDURE TRAY) ×3 IMPLANT
LINER ACETAB BEARING 28X44 SZF (Liner) ×2 IMPLANT
LINER ACETAB G7 44MM SZF (Liner) ×1 IMPLANT
LIQUID BAND (GAUZE/BANDAGES/DRESSINGS) ×3 IMPLANT
MANIFOLD NEPTUNE II (INSTRUMENTS) ×3 IMPLANT
NDL SPNL 18GX3.5 QUINCKE PK (NEEDLE) IMPLANT
NEEDLE SPNL 18GX3.5 QUINCKE PK (NEEDLE) ×3 IMPLANT
NS IRRIG 1000ML POUR BTL (IV SOLUTION) ×3 IMPLANT
PACK TOTAL JOINT (CUSTOM PROCEDURE TRAY) ×3 IMPLANT
PADDING CAST COTTON 6X4 STRL (CAST SUPPLIES) ×3 IMPLANT
SAW OSC TIP CART 19.5X105X1.3 (SAW) ×2 IMPLANT
SCREW ACETABULAR G7 6.5X30 (Screw) ×4 IMPLANT
SCREW ACETABULAR HIP 6.5X20MM (Hips) IMPLANT
SEALER BIPOLAR AQUA 6.0 (INSTRUMENTS) ×2 IMPLANT
SET HNDPC FAN SPRY TIP SCT (DISPOSABLE) ×1 IMPLANT
SHELL ACETABULAR 3H 54MM F (Shell) ×2 IMPLANT
SPONGE GAUZE 2X2 STER 10/PKG (GAUZE/BANDAGES/DRESSINGS) ×2
STEM FEMORAL MOD ARCOS 18X150 (Stem) ×1 IMPLANT
SUCTION FRAZIER HANDLE 10FR (MISCELLANEOUS) ×2
SUCTION TUBE FRAZIER 10FR DISP (MISCELLANEOUS) ×1 IMPLANT
SUT ETHIBOND NAB CT1 #1 30IN (SUTURE) ×1 IMPLANT
SUT MNCRL AB 3-0 PS2 27 (SUTURE) ×5 IMPLANT
SUT MON AB 2-0 CT1 36 (SUTURE) ×7 IMPLANT
SUT VIC AB 1 CT1 27 (SUTURE) ×9
SUT VIC AB 1 CT1 27XBRD ANBCTR (SUTURE) ×3 IMPLANT
SUT VIC AB 2-0 CT1 27 (SUTURE) ×6
SUT VIC AB 2-0 CT1 TAPERPNT 27 (SUTURE) ×2 IMPLANT
SUT VLOC 180 0 24IN GS25 (SUTURE) ×3 IMPLANT
SYR 50ML LL SCALE MARK (SYRINGE) ×3 IMPLANT
TOWEL OR 17X26 10 PK STRL BLUE (TOWEL DISPOSABLE) ×6 IMPLANT
TRAY FOLEY CATH 16FRSI W/METER (SET/KITS/TRAYS/PACK) ×3 IMPLANT
YANKAUER SUCT BULB TIP NO VENT (SUCTIONS) ×2 IMPLANT

## 2015-09-26 NOTE — Brief Op Note (Signed)
09/26/2015  8:13 PM  PATIENT:  Kelly Conrad  82 y.o. female  PRE-OPERATIVE DIAGNOSIS:  LEFT INTERTROCHANTERIC NON UNION   POST-OPERATIVE DIAGNOSIS:  LEFT INTERTROCHANTERIC NON UNION   PROCEDURE:  Procedure(s): HARDWARE REMOVAL LEFT FEMUR (Left) LEFT HIP TOTAL ARTHROPLASTY POSTERIOR  (Left)  SURGEON:  Surgeon(s) and Role:    * Rod Can, MD - Primary  PHYSICIAN ASSISTANT: none  ASSISTANTS: justin queen, rnfa   ANESTHESIA:   general  EBL:  Total I/O In: 600 [I.V.:600] Out: 120 [Urine:70; Blood:50]  BLOOD ADMINISTERED:none  DRAINS: none   LOCAL MEDICATIONS USED:  MARCAINE     SPECIMEN:  No Specimen  DISPOSITION OF SPECIMEN:  N/A  COUNTS:  YES  TOURNIQUET:  * No tourniquets in log *  DICTATION: .Other Dictation: Dictation Number [id number]  PLAN OF CARE: Admit to inpatient   PATIENT DISPOSITION:  PACU - hemodynamically stable.   Delay start of Pharmacological VTE agent (>24hrs) due to surgical blood loss or risk of bleeding: no

## 2015-09-26 NOTE — Anesthesia Preprocedure Evaluation (Signed)
Anesthesia Evaluation  Patient identified by MRN, date of birth, ID band Patient awake    Reviewed: Allergy & Precautions, NPO status , Patient's Chart, lab work & pertinent test results  Airway Mallampati: II  TM Distance: >3 FB Neck ROM: Full    Dental no notable dental hx.    Pulmonary asthma ,    Pulmonary exam normal breath sounds clear to auscultation       Cardiovascular hypertension, Normal cardiovascular exam Rhythm:Regular Rate:Normal     Neuro/Psych dementia negative psych ROS   GI/Hepatic negative GI ROS, Neg liver ROS,   Endo/Other  Hypothyroidism   Renal/GU negative Renal ROS  negative genitourinary   Musculoskeletal negative musculoskeletal ROS (+)   Abdominal   Peds negative pediatric ROS (+)  Hematology negative hematology ROS (+)   Anesthesia Other Findings   Reproductive/Obstetrics negative OB ROS                             Anesthesia Physical Anesthesia Plan  ASA: III  Anesthesia Plan: General   Post-op Pain Management:    Induction: Intravenous  Airway Management Planned: Oral ETT  Additional Equipment:   Intra-op Plan:   Post-operative Plan: Extubation in OR  Informed Consent: I have reviewed the patients History and Physical, chart, labs and discussed the procedure including the risks, benefits and alternatives for the proposed anesthesia with the patient or authorized representative who has indicated his/her understanding and acceptance.   Dental advisory given  Plan Discussed with: CRNA and Surgeon  Anesthesia Plan Comments:         Anesthesia Quick Evaluation

## 2015-09-26 NOTE — H&P (View-Only) (Signed)
TOTAL HIP ADMISSION H&P  Patient is admitted for hardware removal and left total hip arthroplasty.  Subjective:  Chief Complaint: left hip pain  HPI: Kelly Conrad, 82 y.o. female, has a history of pain and functional disability in the left hip(s) due to trauma and patient has failed non-surgical conservative treatments for greater than 12 weeks to include NSAID's and/or analgesics, supervised PT with diminished ADL's post treatment, use of assistive devices and activity modification. Onset of symptoms was abrupt starting 1 years ago with rapidlly worsening course since that time.The patient noted prior procedures of the hip to include IM nailing of IT femur fracture on the left hip(s). Patient currently rates pain in the left hip at 10 out of 10 with activity. Patient has night pain, worsening of pain with activity and weight bearing, pain that interfers with activities of daily living and pain with passive range of motion. Patient has evidence of harware cutout with axial failure of cephalomedullary device by imaging studies. This condition presents safety issues increasing the risk of falls.This patient has had proximal femur fracture. There is no current active infection.  Patient Active Problem List   Diagnosis Date Noted  . Pertrochanteric fracture of left femur ([place]) 04/20/2015  . Intertrochanteric fracture of left femur ([place]) 04/19/2015  . Chronic asthma without complication [id number]  . Hypothyroidism (acquired) 04/19/2015  . Fracture, intertrochanteric, left femur ([place]) 04/19/2015  . Closed left hip fracture ([place])   . Hx of bacterial pneumonia 11/11/2014  . Obesity 09/24/2014  . Pneumonia due to Haemophilus influenzae with late R parapneumonic effusion/scarring  07/14/2014  . Acute respiratory failure ([place])   . Shortness of breath   . CAP (community acquired pneumonia) 06/28/2014  . Sepsis ([place]) 06/28/2014  . Blood poisoning ([place])    . Visit for preventive health examination 11/24/2012  . Medicare annual wellness visit, subsequent 11/24/2012  . Tremor 05/22/2012  . Hearing aid worn 05/22/2012  . Benign hypertensive renal disease 05/22/2012  . History of positive PPD   . Renal insufficiency 04/29/2011  . Dementia 04/23/2011  . COUGH 06/15/2009  . Osteoporosis 04/20/2009  . VITAMIN D DEFICIENCY 01/29/2008  . ASTHMA 01/29/2008  . HYPOTHYROIDISM NOS 01/07/2007  . ADVEF, DRUG/MED/BIOL SUBST, OTHER DRUG NOS 01/07/2007  . HYPERLIPIDEMIA 01/03/2007  . Essential hypertension 01/03/2007  . ALLERGIC RHINITIS 01/03/2007  . GERD 01/03/2007   Past Medical History  Diagnosis Date  . Allergy   . Hypertension   . Hyperlipidemia   . GERD (gastroesophageal reflux disease)   . Asthma   . Hx of varicella   . History of positive PPD     gets c xray screen  . Dementia     Past Surgical History  Procedure Laterality Date  . Femur im nail Left 04/20/2015    Procedure: INTRAMEDULLARY (IM) NAIL FEMORAL; Surgeon: Rod Can, MD; Location: [facility]; Service: Orthopedics; Laterality: Left;     (Not in a hospital admission) No Known Allergies  Social History  Substance Use Topics  . Smoking status: Never Smoker   . Smokeless tobacco: Never Used  . Alcohol Use: No    Family History  Problem Relation Age of Onset  . Hypertension      fhx  . Diabetes Mother   . Stroke Mother   . Diabetes Father   . Edema Father     legs     Review of Systems  Constitutional: Negative.  HENT: Positive for hearing loss.  Eyes: Negative.  Cardiovascular: Negative.  Gastrointestinal:  Negative.  Genitourinary: Negative.  Musculoskeletal: Positive for joint pain.  Skin: Negative.  Neurological: Negative.  Endo/Heme/Allergies: Negative.  Psychiatric/Behavioral: Negative.     Objective:  Physical Exam  Vitals reviewed. Constitutional: She is oriented to person, place, and time. She appears well-developed and well-nourished.  HENT:  Head: Normocephalic and atraumatic.  Eyes: Conjunctivae and EOM are normal. Pupils are equal, round, and reactive to light.  Neck: Normal range of motion. Neck supple.  Cardiovascular: Normal rate, regular rhythm and intact distal pulses.  Respiratory: Effort normal and breath sounds normal. No respiratory distress.  GI: Soft. Bowel sounds are normal. She exhibits no distension.  Genitourinary:  deferred  Musculoskeletal:   Left hip: She exhibits decreased range of motion and decreased strength.  Neurological: She is alert and oriented to person, place, and time. She has normal reflexes.  Skin: Skin is warm and dry.  Psychiatric: She has a normal mood and affect. Her behavior is normal. Judgment and thought content normal.    Vital signs in last 24 hours: @VSRANGES @  Labs:   Estimated body mass index is 32.01 kg/(m^2) as calculated from the following:  Height as of 04/19/15: 5\' 1"  (1.549 m).  Weight as of 06/07/15: 76.794 kg (169 lb 4.8 oz).   Imaging Review Plain radiographs demonstrate mild degenerative joint disease of the left hip(s). The bone quality appears to be poor for age and reported activity level. Hardware cutout - fracture nonunion.  Assessment/Plan:  Nonunion proximal femur with hardware cutout, left hip(s)  The patient history, physical examination, clinical judgement of the provider and imaging studies are consistent with end stage degenerative joint disease of the left hip(s) and total hip arthroplasty is deemed medically necessary. The treatment options including medical management, injection therapy, arthroscopy and arthroplasty were discussed at length. The risks and benefits of total hip arthroplasty were presented and reviewed. The risks due to aseptic loosening, infection, stiffness,  dislocation/subluxation, thromboembolic complications and other imponderables were discussed. The patient acknowledged the explanation, agreed to proceed with the plan and consent was signed. Patient is being admitted for inpatient treatment for surgery, pain control, PT, OT, prophylactic antibiotics, VTE prophylaxis, progressive ambulation and ADL's and discharge planning.The patient is planning to be discharged to skilled nursing facility

## 2015-09-26 NOTE — Anesthesia Procedure Notes (Signed)
Procedure Name: Intubation Date/Time: 09/26/2015 3:53 PM Performed by: Shirlyn Goltz Pre-anesthesia Checklist: Emergency Drugs available, Patient identified, Suction available and Patient being monitored Patient Re-evaluated:Patient Re-evaluated prior to inductionOxygen Delivery Method: Circle system utilized Preoxygenation: Pre-oxygenation with 100% oxygen Intubation Type: IV induction Ventilation: Mask ventilation without difficulty and Oral airway inserted - appropriate to patient size Laryngoscope Size: Mac and 3 Grade View: Grade I Tube type: Oral Tube size: 7.0 mm Number of attempts: 1 Airway Equipment and Method: Stylet Placement Confirmation: ETT inserted through vocal cords under direct vision,  positive ETCO2 and breath sounds checked- equal and bilateral Secured at: 20 cm Tube secured with: Tape Dental Injury: Teeth and Oropharynx as per pre-operative assessment

## 2015-09-26 NOTE — Transfer of Care (Signed)
Immediate Anesthesia Transfer of Care Note  Patient: Kelly Conrad  Procedure(s) Performed: Procedure(s): HARDWARE REMOVAL LEFT FEMUR (Left) LEFT HIP TOTAL ARTHROPLASTY POSTERIOR  (Left)  Patient Location: PACU  Anesthesia Type:General  Level of Consciousness: awake, alert  and oriented  Airway & Oxygen Therapy: Patient Spontanous Breathing and Patient connected to nasal cannula oxygen  Post-op Assessment: Report given to RN and Post -op Vital signs reviewed and stable  Post vital signs: Reviewed and stable  Last Vitals:  Filed Vitals:   09/26/15 1302 09/26/15 2109  Pulse: 80   Temp: 37.1 C 36.4 C  Resp: 18     Last Pain: There were no vitals filed for this visit.       Complications: No apparent anesthesia complications

## 2015-09-26 NOTE — Interval H&P Note (Signed)
History and Physical Interval Note:  09/26/2015 2:56 PM  Kelly Conrad  has presented today for surgery, with the diagnosis of LEFT INTERTROCHANTERIC NON UNION   The various methods of treatment have been discussed with the patient and family. After consideration of risks, benefits and other options for treatment, the patient has consented to  Procedure(s): HARDWARE REMOVAL LEFT FEMUR (Left) LEFT HIP TOTAL ARTHROPLASTY POSTERIOR  (Left) as a surgical intervention .  The patient's history has been reviewed, patient examined, no change in status, stable for surgery.  I have reviewed the patient's chart and labs.  Questions were answered to the patient's satisfaction.     Tejal Monroy, Horald Pollen

## 2015-09-27 LAB — CBC
HCT: 25.9 % — ABNORMAL LOW (ref 36.0–46.0)
HEMOGLOBIN: 8.3 g/dL — AB (ref 12.0–15.0)
MCH: 26.6 pg (ref 26.0–34.0)
MCHC: 32 g/dL (ref 30.0–36.0)
MCV: 83 fL (ref 78.0–100.0)
Platelets: 139 10*3/uL — ABNORMAL LOW (ref 150–400)
RBC: 3.12 MIL/uL — AB (ref 3.87–5.11)
RDW: 16.6 % — ABNORMAL HIGH (ref 11.5–15.5)
WBC: 5.1 10*3/uL (ref 4.0–10.5)

## 2015-09-27 LAB — BASIC METABOLIC PANEL
Anion gap: 6 (ref 5–15)
BUN: 18 mg/dL (ref 6–20)
CO2: 25 mmol/L (ref 22–32)
Calcium: 7.8 mg/dL — ABNORMAL LOW (ref 8.9–10.3)
Chloride: 107 mmol/L (ref 101–111)
Creatinine, Ser: 1.25 mg/dL — ABNORMAL HIGH (ref 0.44–1.00)
GFR calc Af Amer: 45 mL/min — ABNORMAL LOW (ref >60)
GFR calc non Af Amer: 39 mL/min — ABNORMAL LOW (ref >60)
Glucose, Bld: 118 mg/dL — ABNORMAL HIGH (ref 65–99)
Potassium: 4.1 mmol/L (ref 3.5–5.1)
Sodium: 138 mmol/L (ref 135–145)

## 2015-09-27 NOTE — Anesthesia Postprocedure Evaluation (Signed)
Anesthesia Post Note  Patient: Kelly Conrad  Procedure(s) Performed: Procedure(s) (LRB): HARDWARE REMOVAL LEFT FEMUR (Left) LEFT HIP TOTAL ARTHROPLASTY POSTERIOR  (Left)  Patient location during evaluation: PACU Anesthesia Type: General Level of consciousness: sedated and patient cooperative Pain management: pain level controlled Vital Signs Assessment: post-procedure vital signs reviewed and stable Respiratory status: spontaneous breathing Cardiovascular status: stable Anesthetic complications: no    Last Vitals:  Filed Vitals:   09/26/15 2240 09/27/15 0214  BP: 104/41 105/59  Pulse: 73 81  Temp: 36.2 C 36.6 C  Resp: 17 17    Last Pain:  Filed Vitals:   09/27/15 0323  PainSc: 0-No pain                 Nolon Nations

## 2015-09-27 NOTE — Op Note (Signed)
Kelly Conrad, GABEL                 ACCOUNT NO.:  [id number]  MEDICAL RECORD NO.:  [id number]  LOCATION:                               FACILITY:  [facility]  PHYSICIAN:  Rod Can, MD     DATE OF BIRTH:  [birth year]  DATE OF PROCEDURE:  09/26/2015 DATE OF DISCHARGE:  09/15/2015                              OPERATIVE REPORT   SURGEON:  Rod Can, MD.  ASSISTANT:  Ky Barban, [place].  PREOPERATIVE DIAGNOSIS:  Malunion of left pertrochanteric femur fracture with hardware cut out and avascular necrosis of the femoral head.  POSTOPERATIVE DIAGNOSIS:  Malunion of left pertrochanteric femur fracture with hardware cut out and avascular necrosis of the femoral head.  PROCEDURE PERFORMED: 1. Hardware removal, left femur, deep implant. 2. Left total hip arthroplasty.  IMPLANTS: 1. Biomet G7 acetabular shell, size 54 mm 3 hole porous plasma with     dual mobility acetabular liner. 2. Biomet Arcos STS stem size 18 x 150 mm with size A 50 mm standard     cone body and dual mobility liner -3 head ball with a 44 mm outer     liner. 3. A 6.5 mm cancellous screws x2  EXPLANTS: Synthes TFNA nail with distal interlock screw x1 and TFNA blade x1.  ANESTHESIA:  General.  ANTIBIOTICS:  2 g Ancef.  COMPLICATIONS:  None.  TUBES AND DRAINS:  None.  EBL:  600 mL.  DISPOSITION:  Stable to PACU.  INDICATIONS:  The patient is an 82 year old female, who had a previous fall with comminuted left pertrochanteric femur fracture that was treated with a cephalomedullary nail.  She subsequently developed AVN of the femoral head with axial failure of the TFNA blade and hardware cut out.  Risks, benefits, and alternatives to hardware removal and total hip arthroplasty were explained to the patient and her POA and they elected to proceed.  DESCRIPTION OF PROCEDURE IN DETAIL:  The patient was identified in the holding area using 2 identifiers.  I marked the surgical site.  She was taken to the  operating room.  General anesthesia was induced.  A Foley catheter was placed.  She was flipped in the lateral decubitus position. Axillary roll was placed.  All bony prominences were well padded.  She was secured to the bed with a hip positioner.  The left hip was prepped and draped in normal sterile surgical fashion.  A time-out was called verifying site and side of surgery.  I utilized her previous distal incision.  I used a screwdriver to remove her distal interlock screw.  I turned my attention proximally.  I made a standard longitudinal incision centered over the tip of the greater trochanter.  I made full- thickness skin flaps.  I identified the IT band which I split in line with fibers.  I then made a standard posterior approach to the hip.  The  sciatic nerve was identified and protected throughout the case. Her TFNA blade was axially migrated into the bone of the pelvis and I was unable to dislocate her hip safely.  I therefore turned my attention to the tip of the trochanter.  I used fluoroscopy to define the tip  of the nail.  I used a curette and a rongeur to remove a minimal amount of bone.  I then inserted a guidepin into the end of the nail.  I was able to over the guide pin thread the extractor into the nail.  I then used a screwdriver inside of the extractor to completely remove the set screw.  I then turned my attention back to the hip.  I was able to safely dislocate the hip.  I used a saw to make the femoral neck cut. She did have collapse of her femoral head.  She had axial failure of the TFNA blade.  The fracture itself was healed.  I then gently backslapped the extractor and removed her nail in its entirety.  I then placed acetabular retractors and I focused my attention on the acetabulum.  Her bone quality was quite poor.  She had a medial defect that was approximately a 2 cm circular uncontained defect.  I used a 45 reamer to ream to the floor of the cotyloid  fossa.  The labrum was removed.  Then, I sequentially reamed up to a 53 mm.  I engaged the anterior and posterior walls very nicely.  I selected the real 54 mm cup which I impacted into place at approximately 40 degrees of adduction and 20 degrees of anteversion.  The cup achieved excellent bony fixation and I chose to augment this with two 6.5 cancellous screws in the posterior superior quadrant.  I then placed a real dual mobility liner.  I then turned my attention to the femur.  I used a box osteotome.  I used a canal finder and I then sequentially reamed up to an 18 mm reamer by hand.  The real stem was placed.  I then reamed up to a B.  I placed a 60 mm length size B proximal cone trial.  This was going to give her too much length, I therefore placed a 50 mm which only came in size A.  I put a trial head on.  There was excellent stability.  Tissue tension was appropriate.  I dislocated the hip.  I removed all the trial implants. I then put on the real 50 mm proximal body.  I maintained appropriate version.  This was impacted down.  I then used the locking bolt.  I placed the real dual mobility head -3.  The hip was reduced.  She was stable in potty position. With neutral abduction and 90 degrees of Flexion, I was able to internally rotate her about 60 degrees before trochanter began impinging on the pelvis anteriorly.  Leg lengths were palpated.  She was slightly lengthened.  This was deemed acceptable.  I copiously irrigated the wound with a dilute Betadine solution followed by saline with pulse lavage.  The wound was closed in layers with #1 Vicryl for the capsule and short external rotators, #1 Vicryl and 0 V- Loc for the IT band followed by 2-0 Vicryl for the deep fatty tissues, 2- 0 Monocryl for the deep dermal layer, and running 3-0 Monocryl subcuticular stitch.  Glue was applied to the skin.  Once the glue was fully hardened, sterile Aquacel dressings were applied.  The patient  was then transferred to her bed, extubated, taken to PACU in stable condition.  Sponge, needle, and counts were correct at the case x2. There were no known complications.  We will admit the patient to the hospital.  She will be touchdown weightbearing left lower extremity.  We  will have Physical and Occupational to see her for bed-to-chair transfers.  Touchdown weightbearing on a walker.  Place her on apixaban for DVT prophylaxis. I will see her 2 weeks after discharge.  She will need skilled nursing facility placement.    ______________________________ Rod Can, MD   ______________________________ Rod Can, MD    BS/MEDQ  D:  09/26/2015  T:  09/27/2015  Job:  [id number]

## 2015-09-27 NOTE — Op Note (Deleted)
NAMEJENNINGS, Kelly Conrad                 ACCOUNT NO.:  [id number]  MEDICAL RECORD NO.:  [id number]  LOCATION:                               FACILITY:  [facility]  PHYSICIAN:  Rod Can, MD     DATE OF BIRTH:  1931/03/27  DATE OF PROCEDURE:  09/26/2015 DATE OF DISCHARGE:  09/15/2015                              OPERATIVE REPORT   SURGEON:  Rod Can, MD.  ASSISTANT:  Ky Barban, [place].  PREOPERATIVE DIAGNOSIS:  Malunion of left pertrochanteric femur fracture with hardware cut out and avascular necrosis of the femoral head.  POSTOPERATIVE DIAGNOSIS:  Malunion of left pertrochanteric femur fracture with hardware cut out and avascular necrosis of the femoral head.  PROCEDURE PERFORMED: 1. Hardware removal, left femur, deep implant. 2. Left total hip arthroplasty.  IMPLANTS: 1. Biomet G7 acetabular shell, size 54 mm 3 hole porous plasma with     dual mobility acetabular liner. 2. Biomet Arcos STS stem size 18 x 150 mm with size A 50 mm standard     cone body and dual mobility liner -3 head ball with a 44 mm outer     liner. 3. A 6.5 mm cancellous screws x2 _________ Synthes TFNA nail with     distal interlock screw x1 and TFNA blade x1.  ANESTHESIA:  General.  ANTIBIOTICS:  2 g Ancef.  COMPLICATIONS:  None.  TUBES AND DRAINS:  None.  EBL:  600 mL.  DISPOSITION:  Stable to PACU.  INDICATIONS:  The patient is an 82 year old female, who had a previous fall with comminuted left pertrochanteric femur fracture that was treated with a cephalomedullary nail.  She subsequently developed AVN of the femoral head with axial failure of the TFNA blade and hardware cut out.  Risks, benefits, and alternatives to hardware removal and total hip arthroplasty were explained to the patient and her POA and they elected to proceed.  DESCRIPTION OF PROCEDURE IN DETAIL:  The patient was identified in the holding area using 2 identifiers.  I marked the surgical site.  She was taken to the  operating room.  General anesthesia was induced.  A Foley catheter was placed.  She was flipped in the lateral decubitus position. Axillary roll was placed.  All bony prominences were well padded.  She was secured to the bed with a hip positioner.  The left hip was prepped and draped in normal sterile surgical fashion.  A time-out was called verifying site and side of surgery.  I utilized her previous distal incision.  I used a screwdriver to remove her distal interlock screw.  I turned my attention proximally.  I made a standard longitudinal incisions centered over the tip of the greater trochanter.  I made full- thickness skin flaps.  I identified the IT band which I split in line with fibers.  I then made a standard posterior approach to the hip.  Her TFNA blade was axially migrated into the bone of the pelvis and I was unable to dislocate her hip safely.  I therefore turned my attention to the tip of the trochanter.  I used fluoroscopy to define the tip of the nail.  I used a curette  and a rongeur to remove a minimal amount of bone.  I then inserted a guidepin into the end of the nail.  I was able to over the guide pin threaded the extractor into the nail.  I then used a screwdriver inside of the extractor to completely remove the set screw.  I then turned my attention back to the hip.  I was able to safely dislocate the hip.  I used a saw to make the femoral neck cut. She did have collapse of her femoral head.  She had axial failure of the TFNA blade.  The fracture itself was healed.  I then gently backslapped the extractor and removed her nail in its entirety.  I then placed acetabular retractors and I focused my attention on the acetabulum.  Her bone quality was quite poor.  She had a medial defect.  There was approximately a 2 cm circular.  This was an uncontained defect.  I used a 45 reamer to ream to the floor of the cotyloid fossa.  The labrum was removed.  Then, I sequentially  reamed up to a 53 mm.  I engaged the anterior and posterior walls very nicely.  I selected the real 54 mm cup which I impacted into place at approximately 40 degrees of adduction and 20 degrees of anteversion.  The cup achieved excellent bony fixation and I chose to augment this with two 6.5 cancellous screws in the posterior superior quadrant.  I then placed a real dual mobility liner.  I then turned my attention to the femur.  I used a box osteotome.  I used a canal finder and I then sequentially reamed up to an 18 mm reamer on hand.  The real stem was placed.  I then reamed up to a B.  I placed a 60 mm length size B proximal cone.  This was going to give her too much length, I therefore placed a 50 mm which only came in size A.  I put a trial head on.  There was excellent stability.  Tissue tension was appropriate.  I dislocated the hip.  I removed all the trial implants. I then put on the real 50 mm proximal body.  I maintained appropriate version.  This was impacted down.  I then used the locking bolt.  I placed the real dual mobility head -3.  The hip was reduced.  She was stable in potty position with neutral abduction and 90 degrees of flexion.  I was able to internally rotate her about 60 degrees before trochanter began impinging on the pelvis anteriorly.  Leg lengths were palpated.  She was slightly lengthened.  This was deemed acceptable.  I copiously irrigated the wound with a dilute Betadine solution followed by saline with pulse lavage.  The wound was closed in layers with #1 Vicryl for the capsule and short external rotators, #1 Vicryl and 0 V- Loc for the IT band followed by 2-0 Vicryl for the deep fatty tissues, 2- 0 Monocryl for the deep dermal layer, and running 3-0 Monocryl subcuticular stitch.  Glue was applied to the skin.  Once the glue was fully hardened, sterile Aquacel dressings were applied.  The patient was then transferred to her bed, extubated, taken to PACU  in stable condition.  Sponge, needle, and counts were correct at the case x2. There were no known complications.  We will admit the patient to the hospital.  She will be touchdown weightbearing left lower extremity.  We will have Physical  and Occupational to see her for bed-to-chair transfers.  Touchdown weightbearing on a walker.  Place her on apixaban for DVT prophylaxis. I will see her 2 weeks after discharge.  She will need skilled nursing facility placement.    ______________________________ Rod Can, MD   ______________________________ Rod Can, MD    BS/MEDQ  D:  09/26/2015  T:  09/27/2015  Job:  [id number]

## 2015-09-27 NOTE — Progress Notes (Signed)
OT Cancellation Note  Patient Details Name: Kelly Conrad MRN: [id number] DOB: 1931/09/18   Cancelled Treatment:    Reason Eval/Treat Not Completed: Other (comment) -- Per chart review, d/c plan is SNF. Will defer OT needs to SNF. Thank you.  Kleber Crean A 09/27/2015, 8:30 AM

## 2015-09-27 NOTE — Discharge Summary (Signed)
Physician Discharge Summary  Patient ID: Kelly Conrad MRN: [id number] DOB/AGE: 1931/10/15 82 y.o.  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 Allergy   . Hypertension   . Hyperlipidemia   . GERD (gastroesophageal reflux disease)   . Asthma   . Hx of varicella   . History of positive PPD     gets c xray screen  . Pneumonia     06/2014- hosp.   . Arthritis     hip, hands   . Dementia     pt. oriented to person , place & time. , very cooperative with interview     Surgeries: Procedure(s): HARDWARE REMOVAL LEFT FEMUR LEFT HIP TOTAL ARTHROPLASTY POSTERIOR  on 09/26/2015   Consultants (if any):    Discharged Condition: Improved  Hospital Course: Kelly Conrad is an 82 y.o. female who was admitted 09/26/2015 with a diagnosis of Closed fracture of left hip requiring operative repair with nonunion and went to the operating room on 09/26/2015 and underwent the above named procedures.    She was given perioperative antibiotics:      Anti-infectives    Start     Dose/Rate Route Frequency Ordered Stop   09/27/15 0400  ceFAZolin (ANCEF) IVPB 1 g/50 mL premix     1 g 100 mL/hr over 30 Minutes Intravenous Every 6 hours 09/26/15 2229 09/27/15 1031   09/26/15 1430  ceFAZolin (ANCEF) IVPB 2g/100 mL premix     2 g 200 mL/hr over 30 Minutes Intravenous To ShortStay [facility] 09/23/15 1216 09/26/15 1956    .  She was given sequential compression devices and apixaban for DVT prophylaxis. She was touch down weight bearing with a walker. She was found to have a postop sciatic nerve palsy, which we will observe. She is positioned in the bed with her hip in extension and knee in  flexion. She had PT/OT for bed to chair transfers. Her hgb was 8.3 and asymptomatic.   She benefited maximally from the hospital stay.  Recent vital signs:  Filed Vitals:   09/28/15 0448 09/28/15 0832  BP: 110/53 111/44  Pulse: 83 87  Temp: 98.5 F (36.9 C)   Resp: 18 17    Recent laboratory studies:  Lab Results  Component Value Date   HGB 8.3* 09/28/2015   HGB 8.3* 09/27/2015   HGB 13.9 09/15/2015   Lab Results  Component Value Date   WBC 10.0 09/28/2015   PLT 156 09/28/2015   Lab Results  Component Value Date   INR 1.07 04/19/2015   Lab Results  Component Value Date   NA 138 09/27/2015   K 4.1 09/27/2015   CL 107 09/27/2015   CO2 25 09/27/2015   BUN 18 09/27/2015   CREATININE 1.25* 09/27/2015   GLUCOSE 118* 09/27/2015    Discharge Medications:     Medication List    STOP taking these medications        aspirin 81 MG tablet      TAKE these medications        apixaban 2.5 MG Tabs tablet  Commonly known as:  ELIQUIS  Take 1 tablet (2.5 mg total) by mouth every 12 (twelve) hours.  Calcium Carb-Cholecalciferol 600-800 MG-UNIT Tabs  Take 1 tablet by mouth daily.     CENTRUM SILVER PO  Take 1 tablet by mouth daily.     docusate sodium 100 MG capsule  Commonly known as:  COLACE  Take 1 capsule (100 mg total) by mouth 2 (two) times daily.     donepezil 10 MG tablet  Commonly known as:  ARICEPT  Take 1 tablet (10 mg total) by mouth at bedtime as needed.     Fish Oil 1000 MG Caps  Take 1 capsule by mouth daily.     hydrochlorothiazide 12.5 MG capsule  Commonly known as:  MICROZIDE  TAKE 1 CAPSULE (12.5 MG TOTAL) BY MOUTH DAILY.     HYDROcodone-acetaminophen 5-325 MG tablet  Commonly known as:  NORCO  Take 1-2 tablets by mouth every 4 (four) hours as needed for moderate pain.     ondansetron 4 MG tablet  Commonly known as:  ZOFRAN  Take 1 tablet (4 mg total) by mouth every 6 (six) hours as needed for nausea.     senna 8.6 MG Tabs tablet   Commonly known as:  SENOKOT  Take 2 tablets (17.2 mg total) by mouth at bedtime.     simvastatin 40 MG tablet  Commonly known as:  ZOCOR  TAKE 1 TABLET BY MOUTH AT BEDTIME        Diagnostic Studies: Dg Pelvis Portable  09/26/2015  CLINICAL DATA:  Postop. Status post hardware removal left femur, and left hip total arthroplasty. EXAM: PORTABLE PELVIS 1-2 VIEWS COMPARISON:  Preoperative radiographs 04/20/2015 FINDINGS: Previous intra medullary rod is been removed, there is new total left hip arthroplasty in place. Femoral stem is midline. Recent postsurgical change includes air and edema in the adjacent soft tissues. IMPRESSION: New left hip arthroplasty without immediate postoperative complication. Electronically Signed   By: Jeb Levering M.D.   On: 09/26/2015 21:43   Dg C-arm 61-120 Min  09/26/2015  CLINICAL DATA:  Left total hip replacement EXAM: DG C-ARM 61-120 MIN; OPERATIVE LEFT HIP WITH PELVIS Fluoroscopy:  1 minutes 41 seconds. COMPARISON:  04/20/2015 FINDINGS: Spot fluoroscopic intraoperative view demonstrates left total hip arthroplasty. Components are aligned in the frontal plane. Expected postoperative appearance. IMPRESSION: Status post left total hip arthroplasty. Limited intraoperative imaging. Electronically Signed   By: Jerilynn Mages.  Shick M.D.   On: 09/26/2015 20:09   Dg Hip Operative Unilat W Or W/o Pelvis Left  09/26/2015  CLINICAL DATA:  Left total hip replacement EXAM: DG C-ARM 61-120 MIN; OPERATIVE LEFT HIP WITH PELVIS Fluoroscopy:  1 minutes 41 seconds. COMPARISON:  04/20/2015 FINDINGS: Spot fluoroscopic intraoperative view demonstrates left total hip arthroplasty. Components are aligned in the frontal plane. Expected postoperative appearance. IMPRESSION: Status post left total hip arthroplasty. Limited intraoperative imaging. Electronically Signed   By: Jerilynn Mages.  Shick M.D.   On: 09/26/2015 20:09    Disposition: SNF  Discharge Instructions    Call MD / Call 911    Complete by:  As  directed   If you experience chest pain or shortness of breath, CALL 911 and be transported to the hospital emergency room.  If you develope a fever above 101 F, pus (white drainage) or increased drainage or redness at the wound, or calf pain, call your surgeon's office.     Constipation Prevention    Complete by:  As directed   Drink plenty of fluids.  Prune juice may be helpful.  You may use a stool softener, such as Colace (over the  counter) 100 mg twice a day.  Use MiraLax (over the counter) for constipation as needed.     Diet - low sodium heart healthy    Complete by:  As directed      Discharge instructions    Complete by:  As directed   Touch down weight bearing left leg Posterior hip precautions     Driving restrictions    Complete by:  As directed   No driving for 6 weeks     Follow the hip precautions as taught in Physical Therapy    Complete by:  As directed      Increase activity slowly as tolerated    Complete by:  As directed      Lifting restrictions    Complete by:  As directed   No lifting for 6 weeks     TED hose    Complete by:  As directed   Use stockings (TED hose) for 2 weeks on both leg(s).  You may remove them at night for sleeping.           Follow-up Information    Follow up with Kellie Murrill, Horald Pollen, MD. Schedule an appointment as soon as possible for a visit in 2 weeks.   Specialty:  Orthopedic Surgery   Why:  For wound re-check   Contact information:   [address] [place] [address] [phone]        Signed: Elie Goody 09/28/2015, 1:56 PM

## 2015-09-27 NOTE — Evaluation (Signed)
Physical Therapy Evaluation Patient Details Name: Kelly Conrad MRN: [id number] DOB: 1931/06/22 Today's Date: 09/27/2015   History of Present Illness  82 y.o. female admitted to [facility] on 09/26/15 for elective L THA due to ORIF hardware failure, posterior approach.  Pt with singificant PMHx of dementia, HOH, HTN, IM nail L hip 04/2015.  Clinical Impression  Pt did surprisingly well moving EOB and maintaining TDWB while she took hopping pivotal steps to the recliner chair.  She is appropriate for SNF level rehab at discharge.   PT to follow acutely for deficits listed below.       Follow Up Recommendations SNF (pt wants CSW to consult [name], her cargiver about where)    Equipment Recommendations  None recommended by PT (She has been to [facility] in the past)    Recommendations for Other Services   NA    Precautions / Restrictions Precautions Precautions: Posterior Hip Precaution Booklet Issued: Yes (comment) Precaution Comments: handout given and reviewed Required Braces or Orthoses: Other Brace/Splint Other Brace/Splint: abduction pillow for in bed Restrictions Weight Bearing Restrictions: Yes LLE Weight Bearing: Touchdown weight bearing      Mobility  Bed Mobility Overal bed mobility: Needs Assistance Bed Mobility: Supine to Sit     Supine to sit: Min assist;HOB elevated     General bed mobility comments: Min assist to help progress left leg over EOB and min assist to support truk during transition to sitting EOB.  HOB elevated.   Transfers Overall transfer level: Needs assistance Equipment used: Rolling walker (2 wheeled) Transfers: Sit to/from Stand Sit to Stand: Min assist         General transfer comment: Had to continually cue pt not to get up until PT was ready for her, she kept trying to stand, even without RW in place.  Reinforced with eye contact and slower voice TDWB status of her left leg.   Ambulation/Gait Ambulation/Gait assistance: Min  assist Ambulation Distance (Feet): 5 Feet Assistive device: Rolling walker (2 wheeled) Gait Pattern/deviations: Step-to pattern;Antalgic     General Gait Details: Pt was, surprisingly able to maintain TDWB on her left leg and essentially hop to the recliner chair from the bed with min assist for balance and control of the RW.           Balance Overall balance assessment: Needs assistance Sitting-balance support: Feet supported;No upper extremity supported Sitting balance-Leahy Scale: Good     Standing balance support: Bilateral upper extremity supported Standing balance-Leahy Scale: Poor                               Pertinent Vitals/Pain Pain Assessment: Faces Faces Pain Scale: Hurts little more Pain Location: left hip Pain Descriptors / Indicators: Grimacing;Guarding Pain Intervention(s): Limited activity within patient's tolerance;Monitored during session;Repositioned    Home Living Family/patient expects to be discharged to:: Skilled nursing facility (has been at [facility] before with ORIF) Living Arrangements: Non-relatives/Friends (caregiver "[name]")                    Prior Function Level of Independence: Needs assistance   Gait / Transfers Assistance Needed: per pt report she used RW full time PTA, unsure of assistance with walking.           Hand Dominance   Dominant Hand: Right    Extremity/Trunk Assessment   Upper Extremity Assessment: Overall WFL for tasks assessed  Lower Extremity Assessment: LLE deficits/detail   LLE Deficits / Details: Left leg with normal post op pain, but weak vs decrased ability to understand MMT for L ankle (decreased active DF on the left, PROM to left ankle could get to neutral with a firm end feel.  Nothing in PMHx to indicate that she had left foot drop PTA and per pt she was able to move ankle PTA.  knee 3-/5, hip 2/5  Cervical / Trunk Assessment: Kyphotic  Communication    Communication: HOH  Cognition Arousal/Alertness: Awake/alert Behavior During Therapy: WFL for tasks assessed/performed Overall Cognitive Status: History of cognitive impairments - at baseline (no one present to confirm baseline, hearing likely also affe)       Memory: Decreased recall of precautions;Decreased short-term memory                 Exercises Total Joint Exercises Ankle Circles/Pumps: AROM;Right;AAROM;Left;20 reps;Supine (AAROM on Left) Quad Sets: AROM;Left;10 reps;Supine (hold 5 seconds) Short Arc Quad: AROM;Right;AAROM;Left;10 reps;Supine (AAROM left) Heel Slides: AROM;Left;10 reps;Supine      Assessment/Plan    PT Assessment Patient needs continued PT services  PT Diagnosis Difficulty walking;Abnormality of gait;Generalized weakness;Acute pain   PT Problem List Decreased strength;Decreased range of motion;Decreased activity tolerance;Decreased balance;Decreased mobility;Decreased knowledge of use of DME;Decreased cognition;Decreased knowledge of precautions;Pain  PT Treatment Interventions DME instruction;Gait training;Stair training;Functional mobility training;Therapeutic activities;Therapeutic exercise;Balance training;Neuromuscular re-education;Patient/family education;Modalities;Manual techniques   PT Goals (Current goals can be found in the Care Plan section) Acute Rehab PT Goals Patient Stated Goal: none stated PT Goal Formulation: Patient unable to participate in goal setting Time For Goal Achievement: 10/04/15 Potential to Achieve Goals: Good    Frequency 7X/week           End of Session Equipment Utilized During Treatment: Gait belt Activity Tolerance: Patient limited by fatigue Patient left: in chair;with call bell/phone within reach;with chair alarm set Nurse Communication: Mobility status         Time: [id number] PT Time Calculation (min) (ACUTE ONLY): 34 min   Charges:   PT Evaluation $PT Eval Moderate Complexity: 1 Procedure PT  Treatments $Therapeutic Exercise: 8-22 mins        Bayleigh Loflin B. [place], [place], DPT [phone]   09/27/2015, 5:58 PM

## 2015-09-27 NOTE — Discharge Instructions (Signed)
Dr. Rod Can Joint Replacement Specialist [facility] [address] [place], [place] [address] [phone]   TOTAL HIP REPLACEMENT POSTOPERATIVE DIRECTIONS    Hip Rehabilitation, Guidelines Following Surgery   WEIGHT BEARING Partial weight bearing with assist device as directed.  touch down weight bearing  POSTERIOR HIP PRECAUTIONS  The results of a hip operation are greatly improved after range of motion and muscle strengthening exercises. Follow all safety measures which are given to protect your hip. If any of these exercises cause increased pain or swelling in your joint, decrease the amount until you are comfortable again. Then slowly increase the exercises. Call your caregiver if you have problems or questions.   HOME CARE INSTRUCTIONS  Most of the following instructions are designed to prevent the dislocation of your new hip.  Remove items at home which could result in a fall. This includes throw rugs or furniture in walking pathways.  Continue medications as instructed at time of discharge.  You may have some home medications which will be placed on hold until you complete the course of blood thinner medication.  You may start showering once you are discharged home. Do not remove your dressing. Do not put on socks or shoes without following the instructions of your caregivers.   Sit on chairs with arms. Use the chair arms to help push yourself up when arising.  Arrange for the use of a toilet seat elevator so you are not sitting low.   Walk with walker as instructed.  You may resume a sexual relationship in one month or when given the OK by your caregiver.  Use walker as long as suggested by your caregivers.  You may put full weight on your legs and walk as much as is comfortable. Avoid periods of inactivity such as sitting longer than an hour when not asleep. This helps prevent blood clots.  You may return to work once you are cleared by  Engineer, production.  Do not drive a car for 6 weeks or until released by your surgeon.  Do not drive while taking narcotics.  Wear elastic stockings for two weeks following surgery during the day but you may remove then at night.  Make sure you keep all of your appointments after your operation with all of your doctors and caregivers. You should call the office at the above phone number and make an appointment for approximately two weeks after the date of your surgery. Please pick up a stool softener and laxative for home use as long as you are requiring pain medications.  ICE to the affected hip every three hours for 30 minutes at a time and then as needed for pain and swelling. Continue to use ice on the hip for pain and swelling from surgery. You may notice swelling that will progress down to the foot and ankle.  This is normal after surgery.  Elevate the leg when you are not up walking on it.   It is important for you to complete the blood thinner medication as prescribed by your doctor.  Continue to use the breathing machine which will help keep your temperature down.  It is common for your temperature to cycle up and down following surgery, especially at night when you are not up moving around and exerting yourself.  The breathing machine keeps your lungs expanded and your temperature down.  RANGE OF MOTION AND STRENGTHENING EXERCISES  These exercises are designed to help you keep full movement of your hip joint.  Follow your caregiver's or physical therapist's instructions. Perform all exercises about fifteen times, three times per day or as directed. Exercise both hips, even if you have had only one joint replacement. These exercises can be done on a training (exercise) mat, on the floor, on a table or on a bed. Use whatever works the best and is most comfortable for you. Use music or television while you are exercising so that the exercises are a pleasant break in your day. This will make your life  better with the exercises acting as a break in routine you can look forward to.  Lying on your back, slowly slide your foot toward your buttocks, raising your knee up off the floor. Then slowly slide your foot back down until your leg is straight again.  Lying on your back spread your legs as far apart as you can without causing discomfort.  Lying on your side, raise your upper leg and foot straight up from the floor as far as is comfortable. Slowly lower the leg and repeat.  Lying on your back, tighten up the muscle in the front of your thigh (quadriceps muscles). You can do this by keeping your leg straight and trying to raise your heel off the floor. This helps strengthen the largest muscle supporting your knee.  Lying on your back, tighten up the muscles of your buttocks both with the legs straight and with the knee bent at a comfortable angle while keeping your heel on the floor.   SKILLED REHAB INSTRUCTIONS: If the patient is transferred to a skilled rehab facility following release from the hospital, a list of the current medications will be sent to the facility for the patient to continue.  When discharged from the skilled rehab facility, please have the facility set up the patient's [place] prior to being released. Also, the skilled facility will be responsible for providing the patient with their medications at time of release from the facility to include their pain medication and their blood thinner medication. If the patient is still at the rehab facility at time of the two week follow up appointment, the skilled rehab facility will also need to assist the patient in arranging follow up appointment in our office and any transportation needs.  MAKE SURE YOU:  Understand these instructions.  Will watch your condition.  Will get help right away if you are not doing well or get worse.  Pick up stool softner and laxative for home use following surgery while on pain  medications. Do not remove your dressing. The dressing is waterproof--it is OK to take showers. Continue to use ice for pain and swelling after surgery. Do not use any lotions or creams on the incision until instructed by your surgeon. Total Hip Protocol.  Information on my medicine - ELIQUIS (apixaban)  This medication education was reviewed with me or my healthcare representative as part of my discharge preparation.  The pharmacist that spoke with me during my hospital stay was:  Arminger, Broadus John, [facility]  Why was Eliquis prescribed for you? Eliquis was prescribed for you to reduce the risk of blood clots forming after orthopedic surgery.    What do You need to know about Eliquis? Take your Eliquis TWICE DAILY - one tablet in the morning and one tablet in the evening with or without food.  It would be best to take the dose about the same time each day.  If you have difficulty swallowing the tablet whole please discuss with  your pharmacist how to take the medication safely.  Take Eliquis exactly as prescribed by your doctor and DO NOT stop taking Eliquis without talking to the doctor who prescribed the medication.  Stopping without other medication to take the place of Eliquis may increase your risk of developing a clot.  After discharge, you should have regular check-up appointments with your healthcare provider that is prescribing your Eliquis.  What do you do if you miss a dose? If a dose of ELIQUIS is not taken at the scheduled time, take it as soon as possible on the same day and twice-daily administration should be resumed.  The dose should not be doubled to make up for a missed dose.  Do not take more than one tablet of ELIQUIS at the same time.  Important Safety Information A possible side effect of Eliquis is bleeding. You should call your healthcare provider right away if you experience any of the following: ? Bleeding from an injury or your nose that does not  stop. ? Unusual colored urine (red or dark brown) or unusual colored stools (red or black). ? Unusual bruising for unknown reasons. ? A serious fall or if you hit your head (even if there is no bleeding).  Some medicines may interact with Eliquis and might increase your risk of bleeding or clotting while on Eliquis. To help avoid this, consult your healthcare provider or pharmacist prior to using any new prescription or non-prescription medications, including herbals, vitamins, non-steroidal anti-inflammatory drugs (NSAIDs) and supplements.  This website has more information on Eliquis (apixaban): http://www.eliquis.com/eliquis/home

## 2015-09-27 NOTE — Progress Notes (Signed)
   Subjective:  Patient reports pain as mild to moderate.  No c/o. Denies N/V/CP/SOB.  Objective:   VITALS:   Filed Vitals:   09/26/15 2139 09/26/15 2154 09/26/15 2240 09/27/15 0214  BP: 105/92 104/67 104/41 105/59  Pulse: 74 75 73 81  Temp:   97.2 F (36.2 C) 97.8 F (36.6 C)  TempSrc:   Axillary Oral  Resp: 20 19 17 17   Height:      Weight:      SpO2: 97% 99% 100% 100%    ABD soft Sensation intact distally Intact pulses distally Incision: dressing C/D/I Dorsiflexion, EHL 4-/5. Plantarflexion 4+/5.  Lab Results  Component Value Date   WBC 9.3 09/15/2015   HGB 13.9 09/15/2015   HCT 43.2 09/15/2015   MCV 83.4 09/15/2015   PLT 245 09/15/2015   BMET    Component Value Date/Time   NA 139 09/15/2015 1150   K 3.2* 09/15/2015 1150   CL 105 09/15/2015 1150   CO2 25 09/15/2015 1150   GLUCOSE 127* 09/15/2015 1150   BUN 27* 09/15/2015 1150   CREATININE 1.42* 09/15/2015 1150   CALCIUM 9.3 09/15/2015 1150   GFRNONAA 33* 09/15/2015 1150   GFRAA 39* 09/15/2015 1150     Assessment/Plan: 1 Day Post-Op   Principal Problem:   Closed fracture of left hip requiring operative repair with nonunion Active Problems:   Avascular necrosis of bone of left hip (HCC)   TDWB LLE with walker Posterior hip precautions PT/OT for bed to chair xfers DVT ppx: apixaban, SCDs, TEDs Dispo: SNF placement, d/c tomorrow vs Thurs    Zanden Colver, Horald Pollen 09/27/2015, 7:29 AM   Rod Can, MD Cell [phone]

## 2015-09-27 NOTE — Care Management Important Message (Signed)
Important Message  Patient Details  Name: Kelly Conrad MRN: [id number] Date of Birth: 04-27-1931   Medicare Important Message Given:  Yes    Loann Quill 09/27/2015, 8:05 AM

## 2015-09-28 DIAGNOSIS — R2689 Other abnormalities of gait and mobility: Secondary | ICD-10-CM | POA: Diagnosis not present

## 2015-09-28 DIAGNOSIS — Z5181 Encounter for therapeutic drug level monitoring: Secondary | ICD-10-CM | POA: Diagnosis not present

## 2015-09-28 DIAGNOSIS — R278 Other lack of coordination: Secondary | ICD-10-CM | POA: Diagnosis not present

## 2015-09-28 DIAGNOSIS — M87352 Other secondary osteonecrosis, left femur: Secondary | ICD-10-CM | POA: Diagnosis not present

## 2015-09-28 DIAGNOSIS — S7292XA Unspecified fracture of left femur, initial encounter for closed fracture: Secondary | ICD-10-CM | POA: Diagnosis not present

## 2015-09-28 DIAGNOSIS — M1990 Unspecified osteoarthritis, unspecified site: Secondary | ICD-10-CM | POA: Diagnosis not present

## 2015-09-28 DIAGNOSIS — J45909 Unspecified asthma, uncomplicated: Secondary | ICD-10-CM | POA: Diagnosis not present

## 2015-09-28 DIAGNOSIS — S72009A Fracture of unspecified part of neck of unspecified femur, initial encounter for closed fracture: Secondary | ICD-10-CM | POA: Diagnosis not present

## 2015-09-28 DIAGNOSIS — Z4789 Encounter for other orthopedic aftercare: Secondary | ICD-10-CM | POA: Diagnosis not present

## 2015-09-28 DIAGNOSIS — M6281 Muscle weakness (generalized): Secondary | ICD-10-CM | POA: Diagnosis not present

## 2015-09-28 DIAGNOSIS — E785 Hyperlipidemia, unspecified: Secondary | ICD-10-CM | POA: Diagnosis not present

## 2015-09-28 DIAGNOSIS — I10 Essential (primary) hypertension: Secondary | ICD-10-CM | POA: Diagnosis not present

## 2015-09-28 DIAGNOSIS — K219 Gastro-esophageal reflux disease without esophagitis: Secondary | ICD-10-CM | POA: Diagnosis not present

## 2015-09-28 DIAGNOSIS — S72102D Unspecified trochanteric fracture of left femur, subsequent encounter for closed fracture with routine healing: Secondary | ICD-10-CM | POA: Diagnosis not present

## 2015-09-28 LAB — CBC
HCT: 25.2 % — ABNORMAL LOW (ref 36.0–46.0)
HEMOGLOBIN: 8.3 g/dL — AB (ref 12.0–15.0)
MCH: 27.2 pg (ref 26.0–34.0)
MCHC: 32.9 g/dL (ref 30.0–36.0)
MCV: 82.6 fL (ref 78.0–100.0)
Platelets: 156 10*3/uL (ref 150–400)
RBC: 3.05 MIL/uL — AB (ref 3.87–5.11)
RDW: 16.9 % — ABNORMAL HIGH (ref 11.5–15.5)
WBC: 10 10*3/uL (ref 4.0–10.5)

## 2015-09-28 MED ORDER — ONDANSETRON HCL 4 MG PO TABS: 4.0000 mg | ORAL_TABLET | Freq: Four times a day (QID) | ORAL | Status: DC | PRN

## 2015-09-28 MED ORDER — HYDROCODONE-ACETAMINOPHEN 5-325 MG PO TABS: 1.0000 | ORAL_TABLET | ORAL | Status: DC | PRN

## 2015-09-28 MED ORDER — DOCUSATE SODIUM 100 MG PO CAPS: 100.0000 mg | ORAL_CAPSULE | Freq: Two times a day (BID) | ORAL | Status: DC

## 2015-09-28 MED ORDER — SENNA 8.6 MG PO TABS: 2.0000 | ORAL_TABLET | Freq: Every day | ORAL | Status: DC

## 2015-09-28 MED ORDER — APIXABAN 2.5 MG PO TABS: 2.5000 mg | ORAL_TABLET | Freq: Two times a day (BID) | ORAL | Status: DC

## 2015-09-28 NOTE — Clinical Social Work Note (Signed)
Clinical Social Work Assessment  Patient Details  Name: Kelly Conrad MRN: [id number] Date of Birth: 14-Apr-1931  Date of referral:  09/26/15               Reason for consult:  Facility Placement                Permission sought to share information with:  Family Supports Permission granted to share information::  Yes, Verbal Permission Granted  Name::     Kelly Conrad  Agency::     Relationship::  Caregiver  Contact Information:  [phone]  Housing/Transportation Living arrangements for the past 2 months:  [place] (Patient lives with caregiver) Source of Information:  Patient, Other (Comment Required) (Caregiver Kelly Conrad) Patient Interpreter Needed:  None Criminal Activity/Legal Involvement Pertinent to Current Situation/Hospitalization:  No - Comment as needed Significant Relationships:  Other(Comment) (Caregiver) Lives with:  Other (Comment) (Caregiver Kelly Conrad) Do you feel safe going back to the place where you live?  Yes (Patient feels safe at home, but is agreeable to ST rehab before returinng home) Need for family participation in patient care:  Yes (Comment) (Caregiver very involved in patient care and decision making.)  Care giving concerns:  Patient and caregiver expressed no concerns, but are agreeable to patient going to a facility for Kelly Conrad rehab.  Social Worker assessment / plan:  CSW talked with patient at the bedside and then caregiver, Kelly Conrad by phone while still in the room with patient. Kelly Conrad was sitting in a chair eating lunch, but was agreeable to taking with CSW regarding her discharge plan. Patient was alert, pleasant, and agreeable to talking with CSW. Patient is hard-of-hearing, so CSW spoke loudly enough for patient to hear and interact. Kelly Conrad reported that she has been to Kelly Conrad before, expressed no concerns regarding the care she received there and advised CSW to call Kelly Conrad regarding what facility she would go to. Call made  to Kelly Conrad regarding discharge planning and CSW informed that patient has been to Kelly Conrad before in February of this year. Kelly Conrad 1st choice is Kelly Conrad, however he is concerned about insurance coverage and requested CSW contact facility and find out if patient's insurance will pay 100% for 20 days.   Employment status:  Retired Science writer) PT Recommendations:  [place] / Referral to community resources:  [place] (Kelly Conrad provided witih facility respondes by phone and provided his preference.)  Patient/Family's Response to care:  No concerns expressed regarding patient's care during hospitalization.  Patient/Family's Understanding of and Emotional Response to Diagnosis, Current Treatment, and Prognosis:  Not discussed.  Emotional Assessment Appearance:  Appears stated age Attitude/Demeanor/Rapport:  Other (Appropriate) Affect (typically observed):  Pleasant, Appropriate, Calm Orientation:  Oriented to Self, Oriented to Place, Oriented to  Time, Oriented to Situation Alcohol / Substance use:  Never Used Psych involvement (Current and /or in the community):  No (Comment)  Discharge Needs  Concerns to be addressed:  Discharge Planning Concerns Readmission within the last 30 days:  Yes Current discharge risk:  None Barriers to Discharge:  No Barriers Identified   Kelly Feil, LCSW 09/28/2015, 1:30 PM

## 2015-09-28 NOTE — Progress Notes (Signed)
   Subjective:  Patient reports pain as mild to moderate.  No c/o. Denies N/V/CP/SOB. States she feels better than before surgery.  Objective:   VITALS:   Filed Vitals:   09/27/15 1326 09/27/15 2010 09/28/15 0448 09/28/15 0832  BP: 115/53 99/49 110/53 111/44  Pulse: 85 82 83 87  Temp: 98.2 F (36.8 C) 97.9 F (36.6 C) 98.5 F (36.9 C)   TempSrc: Oral Oral Oral   Resp: 18 16 18 17   Height:      Weight:      SpO2: 99% 98% 98% 99%    ABD soft Sensation intact distally Intact pulses distally Incision: dressing C/D/I  No hematoma Dorsiflexion, EHL 4-/5. Plantarflexion 4+/5.  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

## 2015-09-28 NOTE — Care Management Note (Signed)
Case Management Note  Patient Details  Name: Kelly Conrad MRN: [id number] Date of Birth: 07/13/1931  Subjective/Objective:   82 yr old female s/p left total hip revision.                  Action/Plan:  Patient will need shortterm rehab. Social worker is aware. Patient will go to [facility].   Expected Discharge Date:    09/28/15              Expected Discharge Plan:     In-House Referral:  Clinical Social Work  Discharge planning Services  CM Consult  Post Acute Care Choice:  NA Choice offered to:  NA  DME Arranged:  N/A DME Agency:  NA  HH Arranged:  NA HH Agency:  NA  Status of Service:  Completed, signed off  If discussed at [employer] of Stay Meetings, dates discussed:    Additional Comments:  Ninfa Meeker, RN 09/28/2015, 1:29 PM

## 2015-09-28 NOTE — Progress Notes (Signed)
Reviewed discharge instructions/medications with patient. Made sure she understood Eliquis.  Answered all of her questions. Waiting for transport to [facility].  Called report to [facility].

## 2015-09-28 NOTE — Clinical Social Work Placement (Signed)
   CLINICAL SOCIAL WORK PLACEMENT  NOTE  Date:  09/28/2015  Patient Details  Name: Kelly Conrad MRN: [id number] Date of Birth: 10-20-1931  Clinical Social Work is seeking post-discharge placement for this patient at the [place] level of care (*CSW will initial, date and re-position this form in  chart as items are completed):  No (Talked with patient at bedside and caregiver by phone. Facility preference provided.)   Patient/family provided with [place] Work Department's list of facilities offering this level of care within the geographic area requested by the patient (or if unable, by the patient's family).  Yes   Patient/family informed of their freedom to choose among providers that offer the needed level of care, that participate in Medicare, Medicaid or managed care program needed by the patient, have an available bed and are willing to accept the patient.  No   Patient/family informed of [place]'s ownership interest in [facility] and [facility], as well as of the fact that they are under no obligation to receive care at these facilities.  PASRR submitted to EDS on       PASRR number received on       Existing PASRR number confirmed on 09/28/15     FL2 transmitted to all facilities in geographic area requested by pt/family on 09/28/15     FL2 transmitted to all facilities within larger geographic area on       Patient informed that his/her managed care company has contracts with or will negotiate with certain facilities, including the following:        Yes   Patient/family informed of bed offers received.  Patient [facility] bed at [facility]     Physician recommends and patient chooses bed at      Patient to be transferred to   on  .  Patient to be transferred to facility by       Patient family notified on   of transfer.  Name of family member notified:        PHYSICIAN       Additional Comment:     _______________________________________________ Sable Feil, LCSW 09/28/2015, 1:41 PM

## 2015-09-28 NOTE — NC FL2 (Signed)
[place] LEVEL OF CARE SCREENING TOOL     IDENTIFICATION  Patient Name: Kelly Conrad Birthdate: 11/18/1931 Sex: female Admission Date (Current Location): 09/26/2015  [facility] and [place] Number:  Herbalist and Address:  [facility] [place] [place] [address] [place], [place] [address]      Provider Number: [id number]  Attending Physician Name and Address:  Rod Can, MD  Relative Name and Phone Number:  [name] - caregiver.  Phone [phone]    Current Level of Care: Hospital Recommended Level of Care: [place] Prior Approval Number:    Date Approved/Denied:   PASRR Number: [id number] (Eff. 04/21/15)  Discharge Plan: SNF    Current Diagnoses: Patient Active Problem List   Diagnosis Date Noted  . Closed fracture of left hip requiring operative repair with nonunion 09/26/2015  . Avascular necrosis of bone of left hip ([place]) 09/26/2015  . Pertrochanteric fracture of left femur ([place]) 04/20/2015  . Intertrochanteric fracture of left femur ([place]) 04/19/2015  . Chronic asthma without complication [id number]  . Hypothyroidism (acquired) 04/19/2015  . Fracture, intertrochanteric, left femur ([place]) 04/19/2015  . Closed left hip fracture ([place])   . Hx of bacterial pneumonia 11/11/2014  . Obesity 09/24/2014  . Pneumonia due to Haemophilus influenzae with late R parapneumonic effusion/scarring  07/14/2014  . Acute respiratory failure ([place])   . Shortness of breath   . CAP (community acquired pneumonia) 06/28/2014  . Sepsis ([place]) 06/28/2014  . Blood poisoning ([place])   . Visit for preventive health examination 11/24/2012  . Medicare annual wellness visit, subsequent 11/24/2012  . Tremor 05/22/2012  . Hearing aid worn 05/22/2012  . Benign hypertensive renal disease 05/22/2012  . History of positive PPD   . Renal insufficiency 04/29/2011  . Dementia 04/23/2011  . COUGH 06/15/2009  . Osteoporosis 04/20/2009  .  VITAMIN D DEFICIENCY 01/29/2008  . ASTHMA 01/29/2008  . HYPOTHYROIDISM NOS 01/07/2007  . ADVEF, DRUG/MED/BIOL SUBST, OTHER DRUG NOS 01/07/2007  . HYPERLIPIDEMIA 01/03/2007  . Essential hypertension 01/03/2007  . ALLERGIC RHINITIS 01/03/2007  . GERD 01/03/2007    Orientation RESPIRATION BLADDER Height & Weight     Self, Time, Situation, Place  Normal Continent (Patient has urinary catheter) Weight: 168 lb (76.204 kg) Height:  5\' 1"  (154.9 cm)  BEHAVIORAL SYMPTOMS/MOOD NEUROLOGICAL BOWEL NUTRITION STATUS      Continent Diet (Regular diet)  AMBULATORY STATUS COMMUNICATION OF NEEDS Skin   Limited Assist Verbally Normal, Other (Comment) (Surgical incision - hip)                       Personal Care Assistance Level of Assistance  Bathing, Feeding, Dressing Bathing Assistance: Limited assistance Feeding assistance: Independent Dressing Assistance: Limited assistance     Functional Limitations Info  Sight, Hearing, Speech Sight Info: Adequate Hearing Info: Impaired (Patient wears hearing aids) Speech Info: Adequate    SPECIAL CARE FACTORS FREQUENCY  PT (By licensed PT)     PT Frequency: Evaluated 7/18 and a minimum of 7X per week therapy recommended              Contractures Contractures Info: Not present    Additional Factors Info  Code Status, Allergies Code Status Info: Full Allergies Info: No known allergies           Current Medications (09/28/2015):  This is the current hospital active medication list Current Facility-Administered Medications  Medication Dose Route Frequency Provider Last Rate Last Dose  .  0.9 %  sodium chloride infusion   Intravenous Continuous Rod Can, MD 150 mL/hr at 09/28/15 0505 150 mL/hr at 09/28/15 0505  . acetaminophen (TYLENOL) tablet 650 mg  650 mg Oral Q6H PRN Rod Can, MD       Or  . acetaminophen (TYLENOL) suppository 650 mg  650 mg Rectal Q6H PRN Rod Can, MD      . apixaban Arne [place]) tablet 2.5 mg  2.5  mg Oral Q12H Rod Can, MD   2.5 mg at 09/28/15 0835  . calcium-vitamin D (OSCAL WITH D) 500-200 MG-UNIT per tablet 1 tablet  1 tablet Oral Q breakfast Rod Can, MD   1 tablet at 09/28/15 0835  . docusate sodium (COLACE) capsule 100 mg  100 mg Oral BID Rod Can, MD   100 mg at 09/28/15 0835  . donepezil (ARICEPT) tablet 10 mg  10 mg Oral QHS Rod Can, MD   10 mg at 09/27/15 2117  . hydrochlorothiazide (MICROZIDE) capsule 12.5 mg  12.5 mg Oral Daily Rod Can, MD   12.5 mg at 09/27/15 0844  . HYDROcodone-acetaminophen (NORCO/VICODIN) 5-325 MG per tablet 1-2 tablet  1-2 tablet Oral Q4H PRN Rod Can, MD      . HYDROmorphone (DILAUDID) injection 0.5 mg  0.5 mg Intravenous Q2H PRN Rod Can, MD      . menthol-cetylpyridinium (CEPACOL) lozenge 3 mg  1 lozenge Oral PRN Rod Can, MD       Or  . phenol (CHLORASEPTIC) mouth spray 1 spray  1 spray Mouth/Throat PRN Rod Can, MD      . methocarbamol (ROBAXIN) tablet 500 mg  500 mg Oral Q6H PRN Rod Can, MD   500 mg at 09/28/15 0835   Or  . methocarbamol (ROBAXIN) 500 mg in dextrose 5 % 50 mL IVPB  500 mg Intravenous Q6H PRN Rod Can, MD      . metoCLOPramide (REGLAN) tablet 5-10 mg  5-10 mg Oral Q8H PRN Rod Can, MD       Or  . metoCLOPramide (REGLAN) injection 5-10 mg  5-10 mg Intravenous Q8H PRN Rod Can, MD      . ondansetron (ZOFRAN) tablet 4 mg  4 mg Oral Q6H PRN Rod Can, MD       Or  . ondansetron (ZOFRAN) injection 4 mg  4 mg Intravenous Q6H PRN Rod Can, MD      . polyethylene glycol (MIRALAX / GLYCOLAX) packet 17 g  17 g Oral Daily PRN Rod Can, MD      . senna (SENOKOT) tablet 17.2 mg  2 tablet Oral QHS Rod Can, MD   17.2 mg at 09/27/15 2117  . simvastatin (ZOCOR) tablet 40 mg  40 mg Oral QHS Rod Can, MD   40 mg at 09/27/15 2117  . sodium phosphate (FLEET) 7-19 GM/118ML enema 1 enema  1 enema Rectal Once PRN Rod Can, MD      .  sorbitol 70 % solution 30 mL  30 mL Oral Daily PRN Rod Can, MD       Facility-Administered Medications Ordered in Other Encounters  Medication Dose Route Frequency Provider Last Rate Last Dose  . chlorhexidine (HIBICLENS) 4 % liquid 4 application  60 mL Topical Once Rod Can, MD      . chlorhexidine (HIBICLENS) 4 % liquid 4 application  60 mL Topical Once Rod Can, MD      . povidone-iodine 10 % swab 2 application  2 application Topical Once Rod Can, MD  Discharge Medications: Please see discharge summary for a list of discharge medications.  Relevant Imaging Results:  Relevant Lab Results:   Additional Information [id number].  Sable Feil, LCSW

## 2015-09-28 NOTE — Progress Notes (Signed)
Physical Therapy Treatment Patient Details Name: DEASHLEY PURK MRN: [id number] DOB: 1931-05-10 Today's Date: 09/28/2015    History of Present Illness 82 y.o. female admitted to [facility] on 09/26/15 for elective L THA due to ORIF hardware failure, posterior approach.  Pt with singificant PMHx of dementia, HOH, HTN, IM nail L hip 04/2015.    PT Comments    Pt with good ability to maintain L LE TDWB status.  Decreased L ankle ROM.  She was able to recall 1/3 hip precautions.  Recommend SNF.  Follow Up Recommendations  SNF     Equipment Recommendations  None recommended by PT    Recommendations for Other Services       Precautions / Restrictions Precautions Precautions: Posterior Hip Precaution Comments: recalled 1/3 precautions Other Brace/Splint: abduction pillow for in bed Restrictions LLE Weight Bearing: Touchdown weight bearing    Mobility  Bed Mobility Overal bed mobility: Needs Assistance Bed Mobility: Sit to Supine       Sit to supine: Mod assist   General bed mobility comments: A for LE and to maintain hip preautions  Transfers Overall transfer level: Needs assistance Equipment used: Rolling walker (2 wheeled) Transfers: Sit to/from Stand Sit to Stand: Min assist         General transfer comment: Cues to wait til RW in front of her.  Able to maintain L TDWB.  Ambulation/Gait Ambulation/Gait assistance: Min guard Ambulation Distance (Feet): 5 Feet Assistive device: Rolling walker (2 wheeled) Gait Pattern/deviations: Step-to pattern;Antalgic     General Gait Details: Amb from recliner to bed with some side stepping.  Able to maintain L TDWB, but does move impulsively at times.   Stairs            Wheelchair Mobility    Modified Rankin (Stroke Patients Only)       Balance Overall balance assessment: Needs assistance Sitting-balance support: Feet supported Sitting balance-Leahy Scale: Good     Standing balance support: Bilateral upper  extremity supported Standing balance-Leahy Scale: Poor                      Cognition Arousal/Alertness: Awake/alert Behavior During Therapy: WFL for tasks assessed/performed Overall Cognitive Status: History of cognitive impairments - at baseline       Memory: Decreased recall of precautions;Decreased short-term memory              Exercises Total Joint Exercises Ankle Circles/Pumps: AROM;Right;AAROM;Left;20 reps;Supine Gluteal Sets: Strengthening;Both;10 reps;Supine    General Comments        Pertinent Vitals/Pain Pain Assessment: Faces Faces Pain Scale: Hurts a little bit Pain Location: L hip Pain Descriptors / Indicators: Grimacing Pain Intervention(s): Limited activity within patient's tolerance;Monitored during session;Repositioned;Ice applied    Home Living                      Prior Function            PT Goals (current goals can now be found in the care plan section) Acute Rehab PT Goals Patient Stated Goal: none stated PT Goal Formulation: Patient unable to participate in goal setting Time For Goal Achievement: 10/04/15 Potential to Achieve Goals: Good Progress towards PT goals: Progressing toward goals    Frequency  7X/week    PT Plan Current plan remains appropriate    Co-evaluation             End of Session Equipment Utilized During Treatment: Gait belt Activity Tolerance: Patient tolerated  treatment well Patient left: in bed;with call bell/phone within reach;with bed alarm set     Time: 1440-1450 PT Time Calculation (min) (ACUTE ONLY): 10 min  Charges:  $Therapeutic Activity: 8-22 mins                    G Codes:      Ronee Ranganathan LUBECK 09/28/2015, 3:00 PM

## 2015-09-28 NOTE — Clinical Social Work Note (Signed)
Patient will discharge today per MD order. Patient will discharge to: [facility] RN to call report prior to transportation to: [phone] Transportation: [facility] to be scheduled at 3:45pm per SNF request  CSW sent discharge summary to SNF for review.  RN and patient aware and agreeable to dc plans.  [name], caregiver, will complete paperwork at [facility] [place], LCSW [phone]  [id number], 2S 15-16 and Psychiatric Service Line  Licensed Clinical Social Worker

## 2015-09-29 ENCOUNTER — Encounter (HOSPITAL_COMMUNITY): Payer: Self-pay | Admitting: Orthopedic Surgery

## 2015-09-30 LAB — TYPE AND SCREEN
ABO/RH(D): A POS
Antibody Screen: NEGATIVE
UNIT DIVISION: 0
Unit division: 0

## 2015-10-11 DIAGNOSIS — S72102D Unspecified trochanteric fracture of left femur, subsequent encounter for closed fracture with routine healing: Secondary | ICD-10-CM | POA: Diagnosis not present

## 2015-10-11 DIAGNOSIS — Z4789 Encounter for other orthopedic aftercare: Secondary | ICD-10-CM | POA: Diagnosis not present

## 2015-10-26 DIAGNOSIS — Z961 Presence of intraocular lens: Secondary | ICD-10-CM | POA: Diagnosis not present

## 2015-10-26 DIAGNOSIS — H524 Presbyopia: Secondary | ICD-10-CM | POA: Diagnosis not present

## 2015-10-26 DIAGNOSIS — H53002 Unspecified amblyopia, left eye: Secondary | ICD-10-CM | POA: Diagnosis not present

## 2015-11-08 DIAGNOSIS — S728X2D Other fracture of left femur, subsequent encounter for closed fracture with routine healing: Secondary | ICD-10-CM | POA: Diagnosis not present

## 2015-11-16 DIAGNOSIS — S72102D Unspecified trochanteric fracture of left femur, subsequent encounter for closed fracture with routine healing: Secondary | ICD-10-CM | POA: Diagnosis not present

## 2015-11-21 DIAGNOSIS — S72102D Unspecified trochanteric fracture of left femur, subsequent encounter for closed fracture with routine healing: Secondary | ICD-10-CM | POA: Diagnosis not present

## 2015-11-24 DIAGNOSIS — S72102D Unspecified trochanteric fracture of left femur, subsequent encounter for closed fracture with routine healing: Secondary | ICD-10-CM | POA: Diagnosis not present

## 2015-11-28 DIAGNOSIS — S72102D Unspecified trochanteric fracture of left femur, subsequent encounter for closed fracture with routine healing: Secondary | ICD-10-CM | POA: Diagnosis not present

## 2015-12-01 DIAGNOSIS — S72102D Unspecified trochanteric fracture of left femur, subsequent encounter for closed fracture with routine healing: Secondary | ICD-10-CM | POA: Diagnosis not present

## 2015-12-05 DIAGNOSIS — S72102D Unspecified trochanteric fracture of left femur, subsequent encounter for closed fracture with routine healing: Secondary | ICD-10-CM | POA: Diagnosis not present

## 2015-12-08 DIAGNOSIS — S72102D Unspecified trochanteric fracture of left femur, subsequent encounter for closed fracture with routine healing: Secondary | ICD-10-CM | POA: Diagnosis not present

## 2015-12-12 DIAGNOSIS — S72102D Unspecified trochanteric fracture of left femur, subsequent encounter for closed fracture with routine healing: Secondary | ICD-10-CM | POA: Diagnosis not present

## 2015-12-15 DIAGNOSIS — S72102D Unspecified trochanteric fracture of left femur, subsequent encounter for closed fracture with routine healing: Secondary | ICD-10-CM | POA: Diagnosis not present

## 2015-12-16 DIAGNOSIS — S728X2D Other fracture of left femur, subsequent encounter for closed fracture with routine healing: Secondary | ICD-10-CM | POA: Diagnosis not present

## 2015-12-17 ENCOUNTER — Other Ambulatory Visit: Payer: Self-pay | Admitting: Internal Medicine

## 2015-12-20 DIAGNOSIS — S72102D Unspecified trochanteric fracture of left femur, subsequent encounter for closed fracture with routine healing: Secondary | ICD-10-CM | POA: Diagnosis not present

## 2015-12-21 NOTE — Telephone Encounter (Signed)
Ok to refill x 6 months  I sent this in.

## 2015-12-22 DIAGNOSIS — S72102D Unspecified trochanteric fracture of left femur, subsequent encounter for closed fracture with routine healing: Secondary | ICD-10-CM | POA: Diagnosis not present

## 2015-12-26 DIAGNOSIS — M5416 Radiculopathy, lumbar region: Secondary | ICD-10-CM | POA: Diagnosis not present

## 2015-12-27 DIAGNOSIS — S72102D Unspecified trochanteric fracture of left femur, subsequent encounter for closed fracture with routine healing: Secondary | ICD-10-CM | POA: Diagnosis not present

## 2015-12-29 DIAGNOSIS — S72102D Unspecified trochanteric fracture of left femur, subsequent encounter for closed fracture with routine healing: Secondary | ICD-10-CM | POA: Diagnosis not present

## 2015-12-30 DIAGNOSIS — M5416 Radiculopathy, lumbar region: Secondary | ICD-10-CM | POA: Diagnosis not present

## 2016-01-03 DIAGNOSIS — S72102D Unspecified trochanteric fracture of left femur, subsequent encounter for closed fracture with routine healing: Secondary | ICD-10-CM | POA: Diagnosis not present

## 2016-01-05 DIAGNOSIS — S72102D Unspecified trochanteric fracture of left femur, subsequent encounter for closed fracture with routine healing: Secondary | ICD-10-CM | POA: Diagnosis not present

## 2016-01-10 DIAGNOSIS — S72102D Unspecified trochanteric fracture of left femur, subsequent encounter for closed fracture with routine healing: Secondary | ICD-10-CM | POA: Diagnosis not present

## 2016-01-12 DIAGNOSIS — S72102D Unspecified trochanteric fracture of left femur, subsequent encounter for closed fracture with routine healing: Secondary | ICD-10-CM | POA: Diagnosis not present

## 2016-01-16 ENCOUNTER — Other Ambulatory Visit: Payer: Self-pay | Admitting: Internal Medicine

## 2016-01-17 DIAGNOSIS — S72102D Unspecified trochanteric fracture of left femur, subsequent encounter for closed fracture with routine healing: Secondary | ICD-10-CM | POA: Diagnosis not present

## 2016-01-17 NOTE — Telephone Encounter (Signed)
Does not appear pt gets yearly wellness visits.  Was due for labs in Sept 17.  Please advise.  Thanks!!

## 2016-01-17 NOTE — Telephone Encounter (Signed)
Ok to refill   For 4 months    She is seeing neurology .   She can get appt with susan if guardian wishes    med check  in 4-6 months

## 2016-01-18 ENCOUNTER — Telehealth: Payer: Self-pay | Admitting: Family Medicine

## 2016-01-18 NOTE — Telephone Encounter (Signed)
Please see if pt/guardian will schedule wellness with Wynetta Fines.  Thanks!!!

## 2016-01-18 NOTE — Telephone Encounter (Signed)
Pt gets 90 day supply so sent for 3 months.  Message sent to scheduling to see if pt will make appt with Wynetta Fines.  Will have scheduling make follow up med check once 3 months have passed.

## 2016-01-18 NOTE — Telephone Encounter (Signed)
Pt has been sch

## 2016-01-19 DIAGNOSIS — S72102D Unspecified trochanteric fracture of left femur, subsequent encounter for closed fracture with routine healing: Secondary | ICD-10-CM | POA: Diagnosis not present

## 2016-01-20 ENCOUNTER — Ambulatory Visit (INDEPENDENT_AMBULATORY_CARE_PROVIDER_SITE_OTHER): Payer: Medicare Other

## 2016-01-20 VITALS — BP 130/60 | HR 68 | Ht 61.0 in | Wt 173.4 lb

## 2016-01-20 DIAGNOSIS — Z0000 Encounter for general adult medical examination without abnormal findings: Secondary | ICD-10-CM | POA: Diagnosis not present

## 2016-01-20 NOTE — Progress Notes (Addendum)
Subjective:   Kelly Conrad is a 82 y.o. female who presents for Medicare Annual/Subsequent preventive examination.  The Patient was informed that the wellness visit is to identify future health risk and educate and initiate measures that can reduce risk for increased disease through the lifespan.    NO ROS; Medicare Wellness Visit  Describes health as good, fair or great? Just recovering from hip surgery  [name] her POA and HCPOA with her today. She lives with [name] and he provides for all of her needs.    Preventive Screening -Counseling & Management   Osteoporosis  Hip surgery July 17 this year; Came home first of august 29th, he started therapy Bone density 08/2015 (-0.3)  (States [employer] came to the home for bone density)    MEDS; Stopped Eliquis and is taking baby asa; stopped after she returned home; was taking Eliquis post op and was told by surgeon when to stop it. Stopped aricept due to bad dreams Is not taking Norco; denies pain today  Current smoking/ tobacco status/ never smoked Etoh; NO  Psychosocial:  Has brothers and sisters and they are not involved in her care.   RISK FACTORS Regular exercise  She goes x 2 every week to PT  Does marching, kickbacks; sideways; Does about 50 of each;  Walk her up and down the driveway Goes to visit friends in nursing home and walks there when the weather is bad  [name] states she can't lift her leg up but is moving it better now Will do a nerve study Nov 30   Diet Breakfast; scrambled eggs; sausage; pancakes OJ; no coffee Lunch; chicken nuggets; rice; Supper meal with vegetables (K and W vegetable plate ) Eats well; Overweight at present but does not appear to interfere with her ambulation; Triglycerides are elevated    Fall risk (dementia) bone crumbled around the screw with first repair of fx and had to go back in for hip replacement x 1.5 months later per [name].  Mobility of Functional changes this year? Changes in  function and still trying to rehab her back to baseline Safety; community, wears sunscreen, safe place for firearms; Motor vehicle accidents; does not drive  Thinks memory is about the same but will remind her to put her teeth in etc.   Cardiac Risk Factors:  Advanced aged > 31 in men; >65 in women Hyperlipidemia - triglycerides 231 HTN ; good today Obesity but get up and go is good. Gets up from the chair without hesitation  Depression Screen  Sometimes is quite; no change in her mood  PhQ 2: negative  Activities of Daily Living - See functional screen   Hearing Difficulty: can have a hearing test Unclear if she can hear but has "high definition hearing aids" Missy; may make an apt to have her hearing aids checked  No referral necessary   Ophthalmology Exam: just had eye exam x 2 months ago Does not drive; can read the signs on the the wall in the examination room today [name] states she does read the paper.  Cognitive testing; MMSE 25/30 Forgetting some things, but recalled 3/3 items Was able to subtract serial 3 from 20 x 5 Could not draw the picture Was able to spell the names of her doctors; Hearing may be factor  Was able to write a sentence; missed the county and the state;  Has to think through her responses   Advanced Directives; per [name] is completed and on the chart  List  the name of Physicians or other Practitioners you currently use:   Immunization History  Administered Date(s) Administered  . Influenza Split 11/22/2012, 12/10/2013  . Influenza Whole 01/08/2007, 11/21/2011  . Influenza, High Dose Seasonal PF 11/11/2014, 11/11/2015  . Pneumococcal Conjugate-13 03/02/2014  . Pneumococcal Polysaccharide-23 01/29/2008  . Tdap 10/12/2010  . Zoster 10/12/2010   Required Immunizations needed today  Screening test up to date or reviewed for plan of completion Health Maintenance Due  Topic Date Due  . INFLUENZA VACCINE  10/11/2015   Has taken flu vaccine  at [facility] in Sept   Cardiac Risk Factors include: advanced age (>80men, >4 women);dyslipidemia;hypertension;microalbuminuria;obesity (BMI >30kg/m2);sedentary lifestyleDr. Allyson Sabal dermatologist Dr. Mertha Baars; hip surgery (ortho ) Dr. Regis Bill Dr. Lonia Mad eye doctor      Objective:    Vitals: BP 130/60   Pulse 68   Ht 5\' 1"  (1.549 m)   Wt 173 lb 6 oz (78.6 kg)   SpO2 95%   BMI 32.76 kg/m   Body mass index is 32.76 kg/m.  Tobacco History  Smoking Status  . Never Smoker  Smokeless Tobacco  . Never Used     Counseling given: Yes   Past Medical History:  Diagnosis Date  . Allergy   . Arthritis    hip, hands   . Asthma   . Dementia    pt. oriented to person , place & time. , very cooperative with interview   . GERD (gastroesophageal reflux disease)   . History of positive PPD    gets c xray screen  . Hx of varicella   . Hyperlipidemia   . Hypertension   . Pneumonia    06/2014- hosp.    Past Surgical History:  Procedure Laterality Date  . EYE SURGERY Bilateral    cataracts removed, followed by /w IOL  . FEMUR IM NAIL Left 04/20/2015   Procedure: INTRAMEDULLARY (IM) NAIL FEMORAL;  Surgeon: Rod Can, MD;  Location: [facility];  Service: Orthopedics;  Laterality: Left;  . HARDWARE REMOVAL Left 09/26/2015   Procedure: HARDWARE REMOVAL LEFT FEMUR;  Surgeon: Rod Can, MD;  Location: [facility];  Service: Orthopedics;  Laterality: Left;  . TOTAL HIP ARTHROPLASTY Left 09/26/2015   Procedure: LEFT HIP TOTAL ARTHROPLASTY POSTERIOR ;  Surgeon: Rod Can, MD;  Location: [facility];  Service: Orthopedics;  Laterality: Left;   Family History  Problem Relation Age of Onset  . Hypertension      fhx  . Diabetes Mother   . Stroke Mother   . Diabetes Father   . Edema Father     legs   History  Sexual Activity  . Sexual activity: No    Outpatient Encounter Prescriptions as of 01/20/2016  Medication Sig  . Calcium Carb-Cholecalciferol 600-800 MG-UNIT TABS Take 1 tablet by  mouth daily.  Marland Kitchen docusate sodium (COLACE) 100 MG capsule Take 1 capsule (100 mg total) by mouth 2 (two) times daily.  . hydrochlorothiazide (MICROZIDE) 12.5 MG capsule TAKE 1 CAPSULE (12.5 MG TOTAL) BY MOUTH DAILY.  . Multiple Vitamins-Minerals (CENTRUM SILVER PO) Take 1 tablet by mouth daily.   . Omega-3 Fatty Acids (FISH OIL) 1000 MG CAPS Take 1 capsule by mouth daily.  . ondansetron (ZOFRAN) 4 MG tablet Take 1 tablet (4 mg total) by mouth every 6 (six) hours as needed for nausea.  Marland Kitchen senna (SENOKOT) 8.6 MG TABS tablet Take 2 tablets (17.2 mg total) by mouth at bedtime.  . simvastatin (ZOCOR) 40 MG tablet TAKE 1 TABLET BY MOUTH AT  BEDTIME  . apixaban (ELIQUIS) 2.5 MG TABS tablet Take 1 tablet (2.5 mg total) by mouth every 12 (twelve) hours. (Patient not taking: Reported on 01/20/2016)  . donepezil (ARICEPT) 10 MG tablet Take 1 tablet (10 mg total) by mouth at bedtime as needed. (Patient not taking: Reported on 01/20/2016)  . HYDROcodone-acetaminophen (NORCO) 5-325 MG tablet Take 1-2 tablets by mouth every 4 (four) hours as needed for moderate pain. (Patient not taking: Reported on 01/20/2016)   Facility-Administered Encounter Medications as of 01/20/2016  Medication  . chlorhexidine (HIBICLENS) 4 % liquid 4 application  . chlorhexidine (HIBICLENS) 4 % liquid 4 application  . povidone-iodine 10 % swab 2 application    Activities of Daily Living In your present state of health, do you have any difficulty performing the following activities: 01/20/2016 09/15/2015  Hearing? Tempie Donning  Vision? N N  Difficulty concentrating or making decisions? Y N  Walking or climbing stairs? Y N  Dressing or bathing? N N  Doing errands, shopping? Y N  Preparing Food and eating ? Y -  Using the Toilet? N -  Managing your Medications? Y -  Managing your Finances? Y -  Housekeeping or managing your Housekeeping? Y -  Some recent data might be hidden    Patient Care Team: Burnis Medin, MD as PCP - General  (Internal Medicine) Kathrin Penner Jacelyn Grip, MD as Consulting Physician (Otolaryngology) Druscilla Brownie, MD as Consulting Physician (Dermatology)   Assessment:     Exercise Activities and Dietary recommendations Current Exercise Habits: Home exercise routine;Structured exercise class, Type of exercise: strength training/weights;walking, Time (Minutes): 30, Frequency (Times/Week): 5, Weekly Exercise (Minutes/Week): 150, Intensity: Moderate (moderate for her)  Goals    . patient          To restore her function and therapy has been extended       Fall Risk Fall Risk  01/20/2016 03/02/2014  Falls in the past year? Yes No   Depression Screen PHQ 2/9 Scores 03/02/2014  PHQ - 2 Score 0        Immunization History  Administered Date(s) Administered  . Influenza Split 11/22/2012, 12/10/2013  . Influenza Whole 01/08/2007, 11/21/2011  . Influenza, High Dose Seasonal PF 11/11/2014, 11/11/2015  . Pneumococcal Conjugate-13 03/02/2014  . Pneumococcal Polysaccharide-23 01/29/2008  . Tdap 10/12/2010  . Zoster 10/12/2010   Screening Tests Health Maintenance  Topic Date Due  . INFLUENZA VACCINE  10/11/2015  . TETANUS/TDAP  10/11/2020  . DEXA SCAN  Completed  . ZOSTAVAX  Completed  . PNA vac Low Risk Adult  Completed      Plan:     Meds: [name] giving her a B12 Stopped eliquis post dc from SNF Taking baby ASA   May have her hearing checked again with current hearing aids   Still in PT and it is assisting her return to her baseline  Does not engage in conversation or initiate conversation but does respond appropriately when asked; Does seem to smile and mood appears good Could easily read posters in the office Difficulty naming county and state and drawing picture  Had to think through serial 7's  Giving her B12 daily  at home but not sure of dose MMSE 25/30; understanding phrase was difficult; ? Due to hearing  Seems to be receiving very good care form  HCPOA.  During the course of the visit the patient was educated and counseled about the following appropriate screening and preventive services:   Vaccines to include Pneumoccal, Influenza,  Hepatitis B, Td, Zostavax, HCV  Electrocardiogram  Cardiovascular Disease  Colorectal cancer screening  Diabetes screening  Glaucoma screening  Nutrition counseling   Smoking cessation counseling  Patient Instructions (the written plan) was given to the patient.    Lottie Dawson, MD  01/20/2016  I have reviewed above and agree. Lottie Dawson, MD

## 2016-01-20 NOTE — Patient Instructions (Addendum)
Kelly Conrad , Thank you for taking time to come for your Medicare Wellness Visit. I appreciate your ongoing commitment to your health goals. Please review the following plan we discussed and let me know if I can assist you in the future.    [facility]; [phone] [facility] [place]; [phone] Get resource to get information on any and all community programs for Seniors  [place]: [phone] Community Health Response Program -[phone] Public Health Dept; Need to be a skilled visit but can assist with bathing as well; [phone]  Adult center for Enrichment;  Call Senior Line; [phone]  Adult day services include Adult Day Care, Adult Day Healthcare, Group Respite, Care [facility], Volunteer In [employer], Education and Support Program   Dept of Social Services; Call [phone] and ask for SW on call  Options for Medicaid include the Community Alternatives program; [place] (personal care services) or PACE program, which is a medical and social program combined  Caregiver support group and information regarding [place] is at the; [facility] Address: [address], [place], [place] [address]  Phone: [phone]   [employer] general resources for food etc   Http://nihseniorhealth.giv  Deaf & Hard of Hearing Division Services - can assist with hearing aid x 1  No reviews  [place]  [place] [address]  [phone]    These are the goals we discussed: Goals    . patient          To restore her function and therapy has been extended        This is a list of the screening recommended for you and due dates:  Health Maintenance  Topic Date Due  . Flu Shot  10/11/2015  . Tetanus Vaccine  10/11/2020  . DEXA scan (bone density measurement)  Completed  . Shingles Vaccine  Completed  . Pneumonia vaccines  Completed        Fall Prevention in the Home  Falls can cause  injuries. They can happen to people of all ages. There are many things you can do to make your home safe and to help prevent falls.  WHAT CAN I DO ON THE OUTSIDE OF MY HOME?  Regularly fix the edges of walkways and driveways and fix any cracks.  Remove anything that might make you trip as you walk through a door, such as a raised step or threshold.  Trim any bushes or trees on the path to your home.  Use bright outdoor lighting.  Clear any walking paths of anything that might make someone trip, such as rocks or tools.  Regularly check to see if handrails are loose or broken. Make sure that both sides of any steps have handrails.  Any raised decks and porches should have guardrails on the edges.  Have any leaves, snow, or ice cleared regularly.  Use sand or salt on walking paths during winter.  Clean up any spills in your garage right away. This includes oil or grease spills. WHAT CAN I DO IN THE BATHROOM?   Use night lights.  Install grab bars by the toilet and in the tub and shower. Do not use towel bars as grab bars.  Use non-skid mats or decals in the tub or shower.  If you need to sit down in the shower, use a plastic, non-slip stool.  Keep the floor dry. Clean up any water that spills on the floor as soon as it happens.  Remove soap buildup in  the tub or shower regularly.  Attach bath mats securely with double-sided non-slip rug tape.  Do not have throw rugs and other things on the floor that can make you trip. WHAT CAN I DO IN THE BEDROOM?  Use night lights.  Make sure that you have a light by your bed that is easy to reach.  Do not use any sheets or blankets that are too big for your bed. They should not hang down onto the floor.  Have a firm chair that has side arms. You can use this for support while you get dressed.  Do not have throw rugs and other things on the floor that can make you trip. WHAT CAN I DO IN THE KITCHEN?  Clean up any spills right  away.  Avoid walking on wet floors.  Keep items that you use a lot in easy-to-reach places.  If you need to reach something above you, use a strong step stool that has a grab bar.  Keep electrical cords out of the way.  Do not use floor polish or wax that makes floors slippery. If you must use wax, use non-skid floor wax.  Do not have throw rugs and other things on the floor that can make you trip. WHAT CAN I DO WITH MY STAIRS?  Do not leave any items on the stairs.  Make sure that there are handrails on both sides of the stairs and use them. Fix handrails that are broken or loose. Make sure that handrails are as long as the stairways.  Check any carpeting to make sure that it is firmly attached to the stairs. Fix any carpet that is loose or worn.  Avoid having throw rugs at the top or bottom of the stairs. If you do have throw rugs, attach them to the floor with carpet tape.  Make sure that you have a light switch at the top of the stairs and the bottom of the stairs. If you do not have them, ask someone to add them for you. WHAT ELSE CAN I DO TO HELP PREVENT FALLS?  Wear shoes that:  Do not have high heels.  Have rubber bottoms.  Are comfortable and fit you well.  Are closed at the toe. Do not wear sandals.  If you use a stepladder:  Make sure that it is fully opened. Do not climb a closed stepladder.  Make sure that both sides of the stepladder are locked into place.  Ask someone to hold it for you, if possible.  Clearly mark and make sure that you can see:  Any grab bars or handrails.  First and last steps.  Where the edge of each step is.  Use tools that help you move around (mobility aids) if they are needed. These include:  Canes.  Walkers.  Scooters.  Crutches.  Turn on the lights when you go into a dark area. Replace any light bulbs as soon as they burn out.  Set up your furniture so you have a clear path. Avoid moving your furniture  around.  If any of your floors are uneven, fix them.  If there are any pets around you, be aware of where they are.  Review your medicines with your doctor. Some medicines can make you feel dizzy. This can increase your chance of falling. Ask your doctor what other things that you can do to help prevent falls.   This information is not intended to replace advice given to you by your health care provider.  Make sure you discuss any questions you have with your health care provider.   Document Released: 12/23/2008 Document Revised: 07/13/2014 Document Reviewed: 04/02/2014 Elsevier Interactive Patient Education 2016 [place] Maintenance, Female Adopting a healthy lifestyle and getting preventive care can go a long way to promote health and wellness. Talk with your health care provider about what schedule of regular examinations is right for you. This is a good chance for you to check in with your provider about disease prevention and staying healthy. In between checkups, there are plenty of things you can do on your own. Experts have done a lot of research about which lifestyle changes and preventive measures are most likely to keep you healthy. Ask your health care provider for more information. WEIGHT AND DIET  Eat a healthy diet  Be sure to include plenty of vegetables, fruits, low-fat dairy products, and lean protein.  Do not eat a lot of foods high in solid fats, added sugars, or salt.  Get regular exercise. This is one of the most important things you can do for your health.  Most adults should exercise for at least 150 minutes each week. The exercise should increase your heart rate and make you sweat (moderate-intensity exercise).  Most adults should also do strengthening exercises at least twice a week. This is in addition to the moderate-intensity exercise.  Maintain a healthy weight  Body mass index (BMI) is a measurement that can be used to identify possible  weight problems. It estimates body fat based on height and weight. Your health care provider can help determine your BMI and help you achieve or maintain a healthy weight.  For females 46 years of age and older:   A BMI below 18.5 is considered underweight.  A BMI of 18.5 to 24.9 is normal.  A BMI of 25 to 29.9 is considered overweight.  A BMI of 30 and above is considered obese.  Watch levels of cholesterol and blood lipids  You should start having your blood tested for lipids and cholesterol at 80 years of age, then have this test every 5 years.  You may need to have your cholesterol levels checked more often if:  Your lipid or cholesterol levels are high.  You are older than 80 years of age.  You are at high risk for heart disease.  CANCER SCREENING   Lung Cancer  Lung cancer screening is recommended for adults 60-57 years old who are at high risk for lung cancer because of a history of smoking.  A yearly low-dose CT scan of the lungs is recommended for people who:  Currently smoke.  Have quit within the past 15 years.  Have at least a 30-pack-year history of smoking. A pack year is smoking an average of one pack of cigarettes a day for 1 year.  Yearly screening should continue until it has been 15 years since you quit.  Yearly screening should stop if you develop a health problem that would prevent you from having lung cancer treatment.  Breast Cancer  Practice breast self-awareness. This means understanding how your breasts normally appear and feel.  It also means doing regular breast self-exams. Let your health care provider know about any changes, no matter how small.  If you are in your 20s or 30s, you should have a clinical breast exam (CBE) by a health care provider every 1-3 years as part of a regular health exam.  If you are 35 or older, have a CBE every  year. Also consider having a breast X-ray (mammogram) every year.  If you have a family history of  breast cancer, talk to your health care provider about genetic screening.  If you are at high risk for breast cancer, talk to your health care provider about having an MRI and a mammogram every year.  Breast cancer gene (BRCA) assessment is recommended for women who have family members with BRCA-related cancers. BRCA-related cancers include:  Breast.  Ovarian.  Tubal.  Peritoneal cancers.  Results of the assessment will determine the need for genetic counseling and BRCA1 and BRCA2 testing. Cervical Cancer Your health care provider may recommend that you be screened regularly for cancer of the pelvic organs (ovaries, uterus, and vagina). This screening involves a pelvic examination, including checking for microscopic changes to the surface of your cervix (Pap test). You may be encouraged to have this screening done every 3 years, beginning at age 70.  For women ages 107-65, health care providers may recommend pelvic exams and Pap testing every 3 years, or they may recommend the Pap and pelvic exam, combined with testing for human papilloma virus (HPV), every 5 years. Some types of HPV increase your risk of cervical cancer. Testing for HPV may also be done on women of any age with unclear Pap test results.  Other health care providers may not recommend any screening for nonpregnant women who are considered low risk for pelvic cancer and who do not have symptoms. Ask your health care provider if a screening pelvic exam is right for you.  If you have had past treatment for cervical cancer or a condition that could lead to cancer, you need Pap tests and screening for cancer for at least 20 years after your treatment. If Pap tests have been discontinued, your risk factors (such as having a new sexual partner) need to be reassessed to determine if screening should resume. Some women have medical problems that increase the chance of getting cervical cancer. In these cases, your health care provider may  recommend more frequent screening and Pap tests. Colorectal Cancer  This type of cancer can be detected and often prevented.  Routine colorectal cancer screening usually begins at 80 years of age and continues through 80 years of age.  Your health care provider may recommend screening at an earlier age if you have risk factors for colon cancer.  Your health care provider may also recommend using home test kits to check for hidden blood in the stool.  A small camera at the end of a tube can be used to examine your colon directly (sigmoidoscopy or colonoscopy). This is done to check for the earliest forms of colorectal cancer.  Routine screening usually begins at age 15.  Direct examination of the colon should be repeated every 5-10 years through 80 years of age. However, you may need to be screened more often if early forms of precancerous polyps or small growths are found. Skin Cancer  Check your skin from head to toe regularly.  Tell your health care provider about any new moles or changes in moles, especially if there is a change in a mole's shape or color.  Also tell your health care provider if you have a mole that is larger than the size of a pencil eraser.  Always use sunscreen. Apply sunscreen liberally and repeatedly throughout the day.  Protect yourself by wearing long sleeves, pants, a wide-brimmed hat, and sunglasses whenever you are outside. HEART DISEASE, DIABETES, AND HIGH BLOOD PRESSURE  High blood pressure causes heart disease and increases the risk of stroke. High blood pressure is more likely to develop in:  People who have blood pressure in the high end of the normal range (130-139/85-89 mm Hg).  People who are overweight or obese.  People who are African American.  If you are 52-19 years of age, have your blood pressure checked every 3-5 years. If you are 80 years of age or older, have your blood pressure checked every year. You should have your blood  pressure measured twice--once when you are at a hospital or clinic, and once when you are not at a hospital or clinic. Record the average of the two measurements. To check your blood pressure when you are not at a hospital or clinic, you can use:  An automated blood pressure machine at a pharmacy.  A home blood pressure monitor.  If you are between 55 years and 94 years old, ask your health care provider if you should take aspirin to prevent strokes.  Have regular diabetes screenings. This involves taking a blood sample to check your fasting blood sugar level.  If you are at a normal weight and have a low risk for diabetes, have this test once every three years after 80 years of age.  If you are overweight and have a high risk for diabetes, consider being tested at a younger age or more often. PREVENTING INFECTION  Hepatitis B  If you have a higher risk for hepatitis B, you should be screened for this virus. You are considered at high risk for hepatitis B if:  You were born in a country where hepatitis B is common. Ask your health care provider which countries are considered high risk.  Your parents were born in a high-risk country, and you have not been immunized against hepatitis B (hepatitis B vaccine).  You have HIV or AIDS.  You use needles to inject street drugs.  You live with someone who has hepatitis B.  You have had sex with someone who has hepatitis B.  You get hemodialysis treatment.  You take certain medicines for conditions, including cancer, organ transplantation, and autoimmune conditions. Hepatitis C  Blood testing is recommended for:  Everyone born from [birth year] through 1963.  Anyone with known risk factors for hepatitis C. Sexually transmitted infections (STIs)  You should be screened for sexually transmitted infections (STIs) including gonorrhea and chlamydia if:  You are sexually active and are younger than 80 years of age.  You are older than 80 years  of age and your health care provider tells you that you are at risk for this type of infection.  Your sexual activity has changed since you were last screened and you are at an increased risk for chlamydia or gonorrhea. Ask your health care provider if you are at risk.  If you do not have HIV, but are at risk, it may be recommended that you take a prescription medicine daily to prevent HIV infection. This is called pre-exposure prophylaxis (PrEP). You are considered at risk if:  You are sexually active and do not regularly use condoms or know the HIV status of your partner(s).  You take drugs by injection.  You are sexually active with a partner who has HIV. Talk with your health care provider about whether you are at high risk of being infected with HIV. If you choose to begin PrEP, you should first be tested for HIV. You should then be tested every 3 months for as  long as you are taking PrEP.  PREGNANCY   If you are premenopausal and you may become pregnant, ask your health care provider about preconception counseling.  If you may become pregnant, take 400 to 800 micrograms (mcg) of folic acid every day.  If you want to prevent pregnancy, talk to your health care provider about birth control (contraception). OSTEOPOROSIS AND MENOPAUSE   Osteoporosis is a disease in which the bones lose minerals and strength with aging. This can result in serious bone fractures. Your risk for osteoporosis can be identified using a bone density scan.  If you are 26 years of age or older, or if you are at risk for osteoporosis and fractures, ask your health care provider if you should be screened.  Ask your health care provider whether you should take a calcium or vitamin D supplement to lower your risk for osteoporosis.  Menopause may have certain physical symptoms and risks.  Hormone replacement therapy may reduce some of these symptoms and risks. Talk to your health care provider about whether hormone  replacement therapy is right for you.  HOME CARE INSTRUCTIONS   Schedule regular health, dental, and eye exams.  Stay current with your immunizations.   Do not use any tobacco products including cigarettes, chewing tobacco, or electronic cigarettes.  If you are pregnant, do not drink alcohol.  If you are breastfeeding, limit how much and how often you drink alcohol.  Limit alcohol intake to no more than 1 drink per day for nonpregnant women. One drink equals 12 ounces of beer, 5 ounces of wine, or 1 ounces of hard liquor.  Do not use street drugs.  Do not share needles.  Ask your health care provider for help if you need support or information about quitting drugs.  Tell your health care provider if you often feel depressed.  Tell your health care provider if you have ever been abused or do not feel safe at home.   This information is not intended to replace advice given to you by your health care provider. Make sure you discuss any questions you have with your health care provider.   Document Released: 09/11/2010 Document Revised: 03/19/2014 Document Reviewed: 01/28/2013 Elsevier Interactive Patient Education 2016 [employer].   Hearing Loss Hearing loss is a partial or total loss of the ability to hear. This can be temporary or permanent, and it can happen in one or both ears. Hearing loss may be referred to as deafness. Medical care is necessary to treat hearing loss properly and to prevent the condition from getting worse. Your hearing may partially or completely come back, depending on what caused your hearing loss and how severe it is. In some cases, hearing loss is permanent. CAUSES Common causes of hearing loss include:   Too much wax in the ear canal.   Infection of the ear canal or middle ear.   Fluid in the middle ear.   Injury to the ear or surrounding area.   An object stuck in the ear.   Prolonged exposure to loud sounds, such as music.  Less  common causes of hearing loss include:   Tumors in the ear.   Viral or bacterial infections, such as meningitis.   A hole in the eardrum (perforated eardrum).  Problems with the hearing nerve that sends signals between the brain and the ear.  Certain medicines.  SYMPTOMS  Symptoms of this condition may include:  Difficulty telling the difference between sounds.  Difficulty following a conversation  when there is background noise.  Lack of response to sounds in your environment. This may be most noticeable when you do not respond to startling sounds.  Needing to turn up the volume on the television, radio, etc.  Ringing in the ears.  Dizziness.  Pain in the ears. DIAGNOSIS This condition is diagnosed based on a physical exam and a hearing test (audiometry). The audiometry test will be performed by a hearing specialist (audiologist). You may also be referred to an ear, nose, and throat (ENT) specialist (otolaryngologist).  TREATMENT Treatment for recent onset of hearing loss may include:   Ear wax removal.   Being prescribed medicines to prevent infection (antibiotics).   Being prescribed medicines to reduce inflammation (corticosteroids).  HOME CARE INSTRUCTIONS  If you were prescribed an antibiotic medicine, take it as told by your health care provider. Do not stop taking the antibiotic even if you start to feel better.  Take over-the-counter and prescription medicines only as told by your health care provider.  Avoid loud noises.   Return to your normal activities as told by your health care provider. Ask your health care provider what activities are safe for you.  Keep all follow-up visits as told by your health care provider. This is important. SEEK MEDICAL CARE IF:   You feel dizzy.   You develop new symptoms.   You vomit or feel nauseous.   You have a fever.  SEEK IMMEDIATE MEDICAL CARE IF:  You develop sudden changes in your vision.   You  have severe ear pain.   You have new or increased weakness.  You have a severe headache.   This information is not intended to replace advice given to you by your health care provider. Make sure you discuss any questions you have with your health care provider.   Document Released: 02/26/2005 Document Revised: 11/17/2014 Document Reviewed: 07/14/2014 Elsevier Interactive Patient Education [employer].

## 2016-01-24 DIAGNOSIS — C44329 Squamous cell carcinoma of skin of other parts of face: Secondary | ICD-10-CM | POA: Diagnosis not present

## 2016-01-24 DIAGNOSIS — L570 Actinic keratosis: Secondary | ICD-10-CM | POA: Diagnosis not present

## 2016-01-24 DIAGNOSIS — S72102D Unspecified trochanteric fracture of left femur, subsequent encounter for closed fracture with routine healing: Secondary | ICD-10-CM | POA: Diagnosis not present

## 2016-01-26 DIAGNOSIS — S72102D Unspecified trochanteric fracture of left femur, subsequent encounter for closed fracture with routine healing: Secondary | ICD-10-CM | POA: Diagnosis not present

## 2016-02-07 DIAGNOSIS — S72102D Unspecified trochanteric fracture of left femur, subsequent encounter for closed fracture with routine healing: Secondary | ICD-10-CM | POA: Diagnosis not present

## 2016-02-09 ENCOUNTER — Encounter (INDEPENDENT_AMBULATORY_CARE_PROVIDER_SITE_OTHER): Payer: Self-pay | Admitting: Diagnostic Neuroimaging

## 2016-02-09 ENCOUNTER — Ambulatory Visit (INDEPENDENT_AMBULATORY_CARE_PROVIDER_SITE_OTHER): Payer: Medicare Other | Admitting: Diagnostic Neuroimaging

## 2016-02-09 DIAGNOSIS — Z0289 Encounter for other administrative examinations: Secondary | ICD-10-CM

## 2016-02-09 DIAGNOSIS — R29898 Other symptoms and signs involving the musculoskeletal system: Secondary | ICD-10-CM | POA: Diagnosis not present

## 2016-02-16 DIAGNOSIS — R197 Diarrhea, unspecified: Secondary | ICD-10-CM | POA: Diagnosis not present

## 2016-02-16 DIAGNOSIS — J069 Acute upper respiratory infection, unspecified: Secondary | ICD-10-CM | POA: Diagnosis not present

## 2016-02-17 NOTE — Procedures (Signed)
[facility]  NCS (NERVE CONDUCTION STUDY) WITH EMG (ELECTROMYOGRAPHY) REPORT   STUDY DATE: 02/09/16 PATIENT NAME: Kelly Conrad DOB: 04/05/1931 MRN: [id number]  ORDERING CLINICIAN: B Swintek  TECHNOLOGIST: Oneita Jolly ELECTROMYOGRAPHER: Earlean Polka. Nyaisha Simao, MD  CLINICAL INFORMATION: 82 year old female with left total hip replacement in July 2017 with postoperative left leg weakness. Exam shows weakness in left vastus medialis and left tibialis anterior muscles.   FINDINGS: NERVE CONDUCTION STUDY: Left peroneal motor response could not be obtained with recording over EDB nor tibialis anterior  Right peroneal motor response has normal distal latency, decreased amplitude, normal conduction velocity.  Left tibial motor response has normal distal latency, decreased temperature, normal conduction velocity and normal F-wave latency.  Left sural and right superficial peroneal sensory responses have normal peak latencies and decreased amplitudes.  Right sural and left superficial peroneal sensory responses could not be obtained.  Bilateral femoral motor responses could not be obtained.   NEEDLE ELECTROMYOGRAPHY: Needle examination of right lower extremity vastus medialis and tibials anterior is normal.  Needle examination of left lower extremity vastus medialis, tibials anterior shows no abnormal spontaneous activity at rest and decreased recruitment of large motor units on exertion.  Left L4-5 and L5-S1 paraspinal muscles are normal.   IMPRESSION:  Abnormal study demonstrating: 1. 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 2. Chronic denervation in left vastus medialis and left tibialis anterior muscles. This can be seen with lumbar polyradiculopathy, lumbar plexopathy or peripheral neuropathy. Lumbar paraspinal muscles were unremarkable but does not exclude lumbar radiculopathy. 3. Bilateral femoral motor responses could not be  obtained.     INTERPRETING PHYSICIAN:  Penni Bombard, MD Certified in Neurology, Neurophysiology and Neuroimaging  [facility] [address] [place], [place] [address] [phone]    [facility]    Nerve / Sites Rec. Site Peak Lat Ref. Amp.1-2 Ref. Distance    ms ms V V cm  L Sural - Ankle (Calf)     Calf Ankle 3.44 ?4.40 3.4 ?6.0 14  R Sural - Ankle (Calf)     Calf Ankle NR ?4.40 NR ?6.0 14  L Superficial peroneal - Ankle     Lat leg Ankle NR ?4.40 NR ?6.0 14  R Superficial peroneal - Ankle     Lat leg Ankle 3.65 ?4.40 2.1 ?6.0 14     [facility]    Nerve / Sites Muscle Latency Ref. Amplitude Ref. Rel Amp Segments Distance Lat Diff Velocity Ref. Area    ms ms mV mV %  cm ms m/s m/s mVms  L Peroneal - EDB     Ankle EDB NR ?6.5 NR ?2.0 NR Ankle - EDB 9    NR     Fib head EDB NR  NR  NR Fib head - Ankle  NR  ?44 NR         Pop fossa - Ankle  NR     R Peroneal - EDB     Ankle EDB 4.6 ?6.5 1.0 ?2.0 100 Ankle - EDB 9    2.5     Fib head EDB 11.4  0.9  89 Fib head - Ankle 28 6.8 41 ?44 2.2     Pop fossa EDB 13.9  0.9  92.8 Pop fossa - Fib head 10 2.5 40 ?44 2.1         Pop fossa - Ankle  9.3     L Tibial - AH     Ankle  AH 4.3 ?5.8 2.3 ?4.0 100 Ankle - AH 9    4.1     Pop fossa AH 12.1  1.0  43.9 Pop fossa - Ankle 33 7.9 42 ?41 2.6  L Peroneal - Tib Ant     Fib Head Tib Ant NR ?6.7 NR ?3.0 NR Fib Head - Tib Ant 10    NR  R Femoral - Vastus Med     B. Ing Lig Vastus Med NR  NR  NR B. Ing Lig - Vastus Med     NR  L Femoral - Vastus Med     B. Ing Lig Vastus Med NR  NR  NR B. Ing Lig - Vastus Med     NR     F  Wave    Nerve F Lat Ref.   ms ms  L Tibial - AH 52.1 ?56.0

## 2016-03-08 DIAGNOSIS — R05 Cough: Secondary | ICD-10-CM | POA: Diagnosis not present

## 2016-03-08 DIAGNOSIS — J209 Acute bronchitis, unspecified: Secondary | ICD-10-CM | POA: Diagnosis not present

## 2017-01-24 ENCOUNTER — Ambulatory Visit: Payer: Medicare Other | Admitting: Internal Medicine

## 2017-01-24 ENCOUNTER — Other Ambulatory Visit (INDEPENDENT_AMBULATORY_CARE_PROVIDER_SITE_OTHER): Payer: Medicare Other

## 2017-01-24 ENCOUNTER — Ambulatory Visit (INDEPENDENT_AMBULATORY_CARE_PROVIDER_SITE_OTHER)
Admission: RE | Admit: 2017-01-24 | Discharge: 2017-01-24 | Disposition: A | Discharge status: Home / Self Care | Payer: Medicare Other | Source: Ambulatory Visit | Attending: Internal Medicine | Admitting: Internal Medicine

## 2017-01-24 ENCOUNTER — Encounter: Payer: Self-pay | Admitting: Internal Medicine

## 2017-01-24 VITALS — BP 124/78 | HR 80 | Ht 61.0 in | Wt 186.0 lb

## 2017-01-24 DIAGNOSIS — R05 Cough: Secondary | ICD-10-CM

## 2017-01-24 DIAGNOSIS — R059 Cough, unspecified: Secondary | ICD-10-CM

## 2017-01-24 DIAGNOSIS — R0609 Other forms of dyspnea: Secondary | ICD-10-CM

## 2017-01-24 LAB — CBC WITH DIFFERENTIAL/PLATELET
BASOS ABS: 0 10*3/uL (ref 0.0–0.1)
Basophils Relative: 0.5 % (ref 0.0–3.0)
EOS ABS: 0.4 10*3/uL (ref 0.0–0.7)
EOS PCT: 5.1 % — AB (ref 0.0–5.0)
HEMATOCRIT: 44 % (ref 36.0–46.0)
Hemoglobin: 14.4 g/dL (ref 12.0–15.0)
LYMPHS PCT: 27.2 % (ref 12.0–46.0)
Lymphs Abs: 2.2 10*3/uL (ref 0.7–4.0)
MCHC: 32.8 g/dL (ref 30.0–36.0)
MCV: 90.3 fl (ref 78.0–100.0)
MONOS PCT: 8.5 % (ref 3.0–12.0)
Monocytes Absolute: 0.7 10*3/uL (ref 0.1–1.0)
NEUTROS PCT: 58.7 % (ref 43.0–77.0)
Neutro Abs: 4.8 10*3/uL (ref 1.4–7.7)
PLATELETS: 221 10*3/uL (ref 150.0–400.0)
RBC: 4.87 Mil/uL (ref 3.87–5.11)
RDW: 15.2 % (ref 11.5–15.5)
WBC: 8.2 10*3/uL (ref 4.0–10.5)

## 2017-01-24 LAB — TSH: TSH: 2.49 u[IU]/mL (ref 0.35–4.50)

## 2017-01-24 LAB — SEDIMENTATION RATE: SED RATE: 11 mm/h (ref 0–30)

## 2017-01-24 LAB — BRAIN NATRIURETIC PEPTIDE: Pro B Natriuretic peptide (BNP): 147 pg/mL — ABNORMAL HIGH (ref 0.0–100.0)

## 2017-01-24 MED ORDER — FAMOTIDINE 20 MG PO TABS: ORAL_TABLET | ORAL | 2 refills | Status: DC

## 2017-01-24 MED ORDER — PANTOPRAZOLE SODIUM 40 MG PO TBEC: 40.0000 mg | DELAYED_RELEASE_TABLET | Freq: Every day | ORAL | 2 refills | Status: DC

## 2017-01-24 NOTE — Patient Instructions (Signed)
mucinex dm 1200 mg every 12hours is the most cough medication you should be using, no other over the counters  For drainage / throat tickle try take CHLORPHENIRAMINE  4 mg - take one every 4 hours as needed - available over the counter- may cause drowsiness so start with just a bedtime dose or two and see how you tolerate it before trying in daytime     Pantoprazole (protonix) 40 mg   Take  30-60 min before first meal of the day and Pepcid (famotidine)  20 mg one @  bedtime until return to office - this is the best way to tell whether stomach acid is contributing to your problem.    GERD (REFLUX)  is an extremely common cause of respiratory symptoms just like yours , many times with no obvious heartburn at all.    It can be treated with medication, but also with lifestyle changes including elevation of the head of your bed (ideally with 6 inch  bed blocks),  Smoking cessation, avoidance of late meals, excessive alcohol, and avoid fatty foods, chocolate, peppermint, colas, red wine, and acidic juices such as orange juice.  NO MINT OR MENTHOL PRODUCTS SO NO COUGH DROPS   USE SUGARLESS CANDY INSTEAD (Jolley ranchers or Stover's or Life Savers) or even ice chips will also do - the key is to swallow to prevent all throat clearing. NO OIL BASED VITAMINS - use powdered substitutes.     Please remember to go to the lab and x-ray department downstairs in the basement  for your tests - we will call you with the results when they are available.     Please schedule a follow up office visit in 4 weeks, sooner if needed  with all medications /inhalers/ solutions in hand so we can verify exactly what you are taking. This includes all medications from all doctors and over the counters

## 2017-01-24 NOTE — Progress Notes (Signed)
Subjective:    Patient ID: Kelly Conrad, female    DOB: 24-Dec-1931     MRN: [id number]   History of Present Illness   Admit date: 06/28/2014 Discharge date: 07/04/2014   Discharge Diagnoses:  Principal Problem:  CAP (community acquired pneumonia)/ H flu on BC x 1 /2 ( beta lact neg)  Essential hypertension  Osteoporosis  Dementia  Benign hypertensive renal disease  Sepsis  Acute respiratory failure  Shortness of breath      Filed Weights   06/28/14 2230 06/29/14 0106  Weight: 70.761 kg (156 lb) 74.571 kg (164 lb 6.4 oz)    History of present illness:  Chief Complaint: fever 83 yo female h/o dementia presented to ER with one day of high fever and cough  Hospital Course:  Acute hypoxic resp failure -due to CAP and Diastolic CHF from fluid resuscitation -much improved -got multiple lasix doses 4/19, now off -ECHO with normal EF and wall motion -see Abx below  Community acquired pneumonia -Blood Cx with hemophilus influenzae 1/2 -clinically improved, was initially on broad spectrum antibiotics then change to PO Levaquin  Acute diastolic CHF -due to volume resuscitation -improved, ECHO with normal EF -only required 2 doses of Iv lasix on 2nd day of hospitalization, off diuretics since. -need for long term diuretics to be assessed upon follow up  Sepsis:  -with fever/leukocytosis/lactic acidosis on admission -improved  ARF on CKF III -cr 1.9 on admission, improved to 1.5 at discharge  Hyperglycemia: -no prior diagnosis of DM2, hbaic 6.0  H/o Dementia: mild, has been on aricept for about a year, AAOX3, but impaired memory      08/27/2014 1st [facility] office visit/ Arnita Koons   Chief Complaint  Patient presents with  . Pulmonary Consult    Referred by Dr Regis Bill. Family reports recent PNA treated by antibiotics. Pt c/o SOB with activity, and productive cough  never smoker with  h/o cough x  2010 worse in last 6 months eval by Dr  Joni Fears prev attributed to ACEi,  better  with tramadol > resolved at this point and feels back to baseline Imp late parapneumonic effusion, resolved  rec No change rx    01/24/2017  f/u ov/Fallon Haecker re:  Persistent cough x 2010 , now says never resolved (note hx not consistent but may be the tramadol rx she was using at last ov suppressed the symptom to where it "resolved"  Chief Complaint  Patient presents with  . Acute Visit    Increased cough for the past year.    cough x 2010  Min mucoid production Comes and goes during the day sporadically = a couple of coughs = a spell min mucoid sputum then resolves "ith no pattern at all" but "oh by the way" every night around 2-3 am caretaker aware than she's coughing   Not limited by breathing from desired activities  But by fatigue/ unsteady on feet   No obvious day to day or daytime variability or assoc excess/ purulent sputum or mucus plugs or hemoptysis or cp or chest tightness, subjective wheeze or overt sinus or hb symptoms. No unusual exp hx or h/o childhood pna/ asthma or knowledge of premature birth.  Sleeping ok flat without nocturnal  or early am exacerbation  of respiratory  c/o's or need for noct saba. Also denies any obvious fluctuation of symptoms with weather or environmental changes or other aggravating or alleviating factors except as outlined above   Current Allergies, Complete Past Medical History, Past  Surgical History, Family History, and Social History were reviewed in [employer] record.  ROS  The following are not active complaints unless bolded Hoarseness, sore throat, dysphagia, dental problems, itching, sneezing,  nasal congestion or discharge of excess mucus or purulent secretions, ear ache,   fever, chills, sweats, unintended wt loss or wt gain, classically pleuritic or exertional cp,  orthopnea pnd or leg swelling, presyncope, palpitations, abdominal pain, anorexia, nausea, vomiting, diarrhea  or  change in bowel habits or change in bladder habits, change in stools or change in urine, dysuria, hematuria,  rash, arthralgias, visual complaints, headache, numbness, weakness or ataxia or problems with walking or coordination,  change in mood/affect or memory.         Not able to confirm this is an accurate reflection of what she takes as of 01/24/2017   Medication Sig  . Ascorbic Acid (VITA-C PO) Take 1 tablet daily by mouth.  Marland Kitchen aspirin 81 MG tablet Take 81 mg daily by mouth.  . Calcium Carb-Cholecalciferol 600-800 MG-UNIT TABS Take 1 tablet by mouth daily.  . Cyanocobalamin (VITAMIN B-12 PO) Take 1 tablet daily by mouth.  . hydrochlorothiazide (MICROZIDE) 12.5 MG capsule TAKE 1 CAPSULE (12.5 MG TOTAL) BY MOUTH DAILY.  . Multiple Vitamins-Minerals (CENTRUM SILVER PO) Take 1 tablet by mouth daily.   Marland Kitchen pyridoxine (B-6) 100 MG tablet Take 100 mg daily by mouth.  . simvastatin (ZOCOR) 40 MG tablet TAKE 1 TABLET BY MOUTH AT BEDTIME  . [DISCONTINUED] Dextromethorphan-Guaifenesin (VICKS DAYQUIL MUCUS CONTROL DM PO) Take as needed by mouth.  . [ Omega-3 Fatty Acids (FISH OIL) 1000 MG CAPS Take 1 capsule by mouth daily.               Objective:   Physical Exam  amb wf nad/ very stoic/ never coughed once entire visit   01/24/2017     186  09/24/2014       164      07/14/14 155 lb 14.4 oz (70.716 kg)  06/29/14 164 lb 6.4 oz (74.571 kg)  03/02/14 156 lb 1.6 oz (70.806 kg)    Vital signs reviewed  - Note on arrival 02 sats  94% on RA       HEENT: nl  turbinates bilaterally, and oropharynx. Nl external ear canals without cough reflex - full dentures   NECK :  without JVD/Nodes/TM/ nl carotid upstrokes bilaterally   LUNGS: no acc muscle use,  Nl contour chest which is clear to A and P bilaterally without cough on insp or exp maneuvers   CV:  RRR  no s3 or murmur or increase in P2, and no edema   ABD:  soft and nontender with nl inspiratory excursion in the supine position. No  bruits or organomegaly appreciated, bowel sounds nl  MS:  Nl gait/ ext warm without deformities, calf tenderness, cyanosis or clubbing No obvious joint restrictions   SKIN: warm and dry without lesions    NEURO:  alert, approp, very poor recall of any recent events/  with  no motor or cerebellar deficits apparent.      CXR PA and Lateral:   01/24/2017 :    I personally reviewed images and agree with radiology impression as follows:     No active cardiopulmonary disease.   Labs ordered/ reviewed:      Chemistry      Component Value Date/Time   NA 138 09/27/2015 0702   K 4.1 09/27/2015 0702   CL 107 09/27/2015 1245  CO2 25 09/27/2015 0702   BUN 18 09/27/2015 0702   CREATININE 1.25 (H) 09/27/2015 0702      Component Value Date/Time   CALCIUM 7.8 (L) 09/27/2015 0702   ALKPHOS 45 04/23/2015 0511   AST 28 04/23/2015 0511   ALT 9 (L) 04/23/2015 0511   BILITOT 0.6 04/23/2015 0511        Lab Results  Component Value Date   WBC 8.2 01/24/2017   HGB 14.4 01/24/2017   HCT 44.0 01/24/2017   MCV 90.3 01/24/2017   PLT 221.0 01/24/2017       Eos                         0.4                                                                   01/24/2017      Lab Results  Component Value Date   TSH 2.49 01/24/2017     Lab Results  Component Value Date   PROBNP 147.0 (H) 01/24/2017       Lab Results  Component Value Date   ESRSEDRATE 11 01/24/2017   ESRSEDRATE 38 (H) 08/27/2014            Assessment & Plan:

## 2017-01-25 ENCOUNTER — Encounter: Payer: Self-pay | Admitting: Internal Medicine

## 2017-01-25 LAB — RESPIRATORY ALLERGY PROFILE REGION II ~~LOC~~
ALLERGEN, P. NOTATUM, M1: 0.38 kU/L — AB
ASPERGILLUS FUMIGATUS M3: 0.37 kU/L — AB
Allergen, A. alternata, m6: 0.25 kU/L — ABNORMAL HIGH
Allergen, Comm Silver Birch, t9: <0.1 kU/L
Allergen, D pternoyssinus,d7: 0.12 kU/L — ABNORMAL HIGH
Allergen, Mouse Urine Protein, e78: <0.1 kU/L
Allergen, Oak,t7: <0.1 kU/L
Box Elder IgE: <0.1 kU/L
CLADOSPORIUM HERBARUM (M2) IGE: 0.21 kU/L — ABNORMAL HIGH
CLASS: 0
CLASS: 0
CLASS: 0
CLASS: 0
CLASS: 0
CLASS: 0
CLASS: 0
CLASS: 0
CLASS: 0
CLASS: 0
CLASS: 0
CLASS: 0
CLASS: 0
CLASS: 0
COMMON RAGWEED (SHORT) (W1) IGE: <0.1 kU/L
Class: 0
Class: 0
Class: 0
Class: 0
Class: 0
Class: 0
Class: 0
Class: 0
Class: 1
Class: 1
D. FARINAE: 0.2 kU/L — AB
Elm IgE: <0.1 kU/L
IgE (Immunoglobulin E), Serum: 434 kU/L — ABNORMAL HIGH (ref <=114)
Johnson Grass: <0.1 kU/L
Pecan/Hickory Tree IgE: <0.1 kU/L
Sheep Sorrel IgE: <0.1 kU/L
Timothy Grass: <0.1 kU/L

## 2017-01-25 LAB — INTERPRETATION:

## 2017-01-25 NOTE — Assessment & Plan Note (Signed)
Onset 2010 ? Only response was to tramadol - Allergy profile 01/24/2017 >  Eos 0.4 /  IgE   - 01/24/2017 max rx for gerd/1st gen H1 blockers per guidelines  Focusing on noct cough 1st   The most common causes of chronic cough in immunocompetent adults include the following: upper airway cough syndrome (UACS), previously referred to as postnasal drip syndrome (PNDS), which is caused by variety of rhinosinus conditions; (2) asthma; (3) GERD; (4) chronic bronchitis from cigarette smoking or other inhaled environmental irritants; (5) nonasthmatic eosinophilic bronchitis; and (6) bronchiectasis.   These conditions, singly or in combination, have accounted for up to 94% of the causes of chronic cough in prospective studies.   Other conditions have constituted no >6% of the causes in prospective studies These have included bronchogenic carcinoma, chronic interstitial pneumonia, sarcoidosis, left ventricular failure, ACEI-induced cough, and aspiration from a condition associated with pharyngeal dysfunction.    Chronic cough is often simultaneously caused by more than one condition. A single cause has been found from 38 to 82% of the time, multiple causes from 18 to 62%. Multiply caused cough has been the result of three diseases up to 42% of the time.       Most likely this is either cough variant asthma/ allergy related or allergic rhinitis manifested by Upper airway cough syndrome (previously labeled PNDS),  is so named because it's frequently impossible to sort out how much is  CR/sinusitis with freq throat clearing (which can be related to primary GERD)   vs  causing  secondary (" extra esophageal")  GERD from wide swings in gastric pressure that occur with throat clearing, often  promoting self use of mint and menthol lozenges that reduce the lower esophageal sphincter tone and exacerbate the problem further in a cyclical fashion.   These are the same pts (now being labeled as having "irritable larynx  syndrome" by some cough centers) who not infrequently have a history of having failed to tolerate ace inhibitors,  dry powder inhalers or biphosphonates or report having atypical/extraesophageal reflux symptoms that don't respond to standard doses of PPI  and are easily confused as having aecopd or asthma flares by even experienced allergists/ pulmonologists (myself included).   To sort this out first rec max rx for gerd/ noct h1 per guidelines then return in 4 weeks with all meds in hand using a trust but verify approach to confirm accurate Medication  Reconciliation The principal here is that until we are certain that the  patients are doing what we've asked, it makes no sense to ask them to do more.    Discussed with pt and caretaker: The standardized cough guidelines published in Chest by Lissa Morales in 2006 are still the best available and consist of a multiple step process (up to 12!) , not a single office visit,  and are intended  to address this problem logically,  with an alogrithm dependent on response to empiric treatment at  each progressive step  to determine a specific diagnosis with  minimal addtional testing needed. Therefore if adherence is an issue or can't be accurately verified,  it's very unlikely the standard evaluation and treatment will be successful here.    Furthermore, response to therapy (other than acute cough suppression, which should only be used short term with avoidance of narcotic containing cough syrups if possible), can be a gradual process for which the patient is not likely to  perceive immediate benefit.  Unlike going to an eye doctor  where the best perscription is almost always the first one and is immediately effective, this is almost never the case in the management of chronic cough syndromes. Therefore the patient needs to commit up front to consistently adhere to recommendations  for up to 6 weeks of therapy directed at the likely underlying problem(s) before  the response can be reasonably evaluated.    I had an extended discussion with the patient reviewing all relevant studies completed to date and  lasting 25 minutes of a 40  minute office visit to re-establish   re  persistent non-specific but potentially very serious refractory respiratory symptoms of uncertain and potentially multiple  etiologies.   Each maintenance medication was reviewed in detail including most importantly the difference between maintenance and prns and under what circumstances the prns are to be triggered using an action plan format that is not reflected in the computer generated alphabetically organized AVS.    Please see AVS for specific instructions unique to this office visit that I personally wrote and verbalized to the the pt in detail and then reviewed with pt  by my nurse highlighting any changes in therapy/plan of care  recommended at today's visit.

## 2017-01-25 NOTE — Assessment & Plan Note (Addendum)
01/24/2017   Walked RA x one lap @ 185 stopped due to  Unsteady on feet/ fatigue s sob or desats   No evidence of significant chf/ anemia/thyroid dz > strongly suspect geriatric decline/ deconditioning

## 2017-01-28 NOTE — Progress Notes (Signed)
Spoke with Kelly Conrad and notified of results per MW and he verbalized understanding

## 2017-02-21 ENCOUNTER — Ambulatory Visit: Payer: Medicare Other | Admitting: Internal Medicine

## 2017-02-21 ENCOUNTER — Encounter: Payer: Self-pay | Admitting: Internal Medicine

## 2017-02-21 VITALS — BP 118/68 | HR 75 | Ht 61.0 in | Wt 184.0 lb

## 2017-02-21 DIAGNOSIS — R0609 Other forms of dyspnea: Secondary | ICD-10-CM | POA: Diagnosis not present

## 2017-02-21 DIAGNOSIS — R059 Cough, unspecified: Secondary | ICD-10-CM

## 2017-02-21 DIAGNOSIS — R05 Cough: Secondary | ICD-10-CM | POA: Diagnosis not present

## 2017-02-21 NOTE — Patient Instructions (Signed)
Continue Pantoprazole (protonix) 40 mg   Take  30-60 min before first meal of the day and Pepcid (famotidine)  20 mg one @  bedtime until return to office - this is the best way to tell whether stomach acid is contributing to your problem.      If needed: Cough/ congestion >>>>  mucinex dm 1200 mg every 12hours is the most cough medication you should be using, no other over the counters  For drainage / throat tickle/ itching/ sneezing/nose running >>> try take CHLORPHENIRAMINE  4 mg - take one every 4 hours as needed - available over the counter- may cause drowsiness so start with just a bedtime dose or two and see how you tolerate it before trying in daytime          Please schedule a follow up visit in 3 months but call sooner if needed  with all medications /inhalers/ solutions in hand so we can verify exactly what you are taking. This includes all medications from all doctors and over the counters

## 2017-02-21 NOTE — Assessment & Plan Note (Signed)
Presently Not limited by breathing from desired activities  > re-eval if recurs but focus on cough elimination first

## 2017-02-21 NOTE — Assessment & Plan Note (Signed)
Body mass index is 34.77 kg/m.  -  trending down slightly  Lab Results  Component Value Date   TSH 2.49 01/24/2017     Contributing to gerd risk/ doe/reviewed the need and the process to achieve and maintain neg calorie balance > defer f/u primary care including intermittently monitoring thyroid status

## 2017-02-21 NOTE — Progress Notes (Signed)
Subjective:    Patient ID: Kelly Conrad, female    DOB: August 25, 1931     MRN: [id number]   History of Present Illness   Admit date: 06/28/2014 Discharge date: 07/04/2014   Discharge Diagnoses:  Principal Problem:  CAP (community acquired pneumonia)/ H flu on BC x 1 /2 ( beta lact neg)  Essential hypertension  Osteoporosis  Dementia  Benign hypertensive renal disease  Sepsis  Acute respiratory failure  Shortness of breath      Filed Weights   06/28/14 2230 06/29/14 0106  Weight: 70.761 kg (156 lb) 74.571 kg (164 lb 6.4 oz)    History of present illness:  Chief Complaint: fever 83 yo female h/o dementia presented to ER with one day of high fever and cough  Hospital Course:  Acute hypoxic resp failure -due to CAP and Diastolic CHF from fluid resuscitation -much improved -got multiple lasix doses 4/19, now off -ECHO with normal EF and wall motion -see Abx below  Community acquired pneumonia -Blood Cx with hemophilus influenzae 1/2 -clinically improved, was initially on broad spectrum antibiotics then change to PO Levaquin  Acute diastolic CHF -due to volume resuscitation -improved, ECHO with normal EF -only required 2 doses of Iv lasix on 2nd day of hospitalization, off diuretics since. -need for long term diuretics to be assessed upon follow up  Sepsis:  -with fever/leukocytosis/lactic acidosis on admission -improved  ARF on CKF III -cr 1.9 on admission, improved to 1.5 at discharge  Hyperglycemia: -no prior diagnosis of DM2, hbaic 6.0  H/o Dementia: mild, has been on aricept for about a year, AAOX3, but impaired memory      08/27/2014 1st [facility] office visit/ Kelly Conrad   Chief Complaint  Patient presents with  . Pulmonary Consult    Referred by Kelly Conrad. Family reports recent PNA treated by antibiotics. Pt c/o SOB with activity, and productive cough  never smoker with  h/o cough x  2010 worse in last 6 months eval by Kelly  Joni Conrad prev attributed to ACEi,  better  with tramadol > resolved at this point and feels back to baseline Imp late parapneumonic effusion, resolved  rec No change rx    01/24/2017  f/u ov/Kelly Conrad re:  Persistent cough x 2010 , now says never resolved (note hx not consistent but may be the tramadol rx she was using at last ov suppressed the symptom to where it "resolved"  Chief Complaint  Patient presents with  . Acute Visit    Increased cough for the past year.    cough x 2010  Min mucoid production Comes and goes during the day sporadically = a couple of coughs = a spell min mucoid sputum then resolves "ith no pattern at all" but "oh by the way" every night around 2-3 am caretaker aware than she's coughing  Not limited by breathing from desired activities  But by fatigue/ unsteady on feet  REC mucinex dm 1200 mg every 12hours is the most cough medication you should be using, no other over the counters For drainage / throat tickle try take CHLORPHENIRAMINE  4 mg - take one every 4 hours as needed - available over the counter- may cause drowsiness so start with just a bedtime dose or two and see how you tolerate it before trying in daytime   Pantoprazole (protonix) 40 mg   Take  30-60 min before first meal of the day and Pepcid (famotidine)  20 mg one @  bedtime until return to office -  this is the best way to tell whether stomach acid is contributing to your problem.    02/21/2017  f/u ov/Kelly Conrad re: chronic cough x 2010 / resolved Not limited by breathing from desired activities   No noct symptoms or coughing at all Caretaker confused between maint and prns and giving both    No obvious day to day or daytime variability or assoc excess/ purulent sputum or mucus plugs or hemoptysis or cp or chest tightness, subjective wheeze or overt sinus or hb symptoms. No unusual exposure hx or h/o childhood pna/ asthma or knowledge of premature birth.  Sleeping ok flat without nocturnal  or early am  exacerbation  of respiratory  c/o's or need for noct saba. Also denies any obvious fluctuation of symptoms with weather or environmental changes or other aggravating or alleviating factors except as outlined above   Current Allergies, Complete Past Medical History, Past Surgical History, Family History, and Social History were reviewed in [employer] record.  ROS  The following are not active complaints unless bolded Hoarseness, sore throat, dysphagia, dental problems, itching, sneezing,  nasal congestion or discharge of excess mucus or purulent secretions, ear ache,   fever, chills, sweats, unintended wt loss or wt gain, classically pleuritic or exertional cp,  orthopnea pnd or leg swelling, presyncope, palpitations, abdominal pain, anorexia, nausea, vomiting, diarrhea  or change in bowel habits or change in bladder habits, change in stools or change in urine, dysuria, hematuria,  rash, arthralgias, visual complaints, headache, numbness, weakness or ataxia or problems with walking or coordination,  change in mood/affect or memory.        Current Meds  Medication Sig  . Ascorbic Acid (VITA-C PO) Take 1 tablet daily by mouth.  Marland Kitchen aspirin 81 MG tablet Take 81 mg daily by mouth.  . Calcium Carb-Cholecalciferol 600-800 MG-UNIT TABS Take 1 tablet by mouth daily.  . Cyanocobalamin (VITAMIN B-12 PO) Take 1 tablet daily by mouth.  . famotidine (PEPCID) 20 MG tablet One at bedtime  . hydrochlorothiazide (MICROZIDE) 12.5 MG capsule TAKE 1 CAPSULE (12.5 MG TOTAL) BY MOUTH DAILY.  . Multiple Vitamins-Minerals (CENTRUM SILVER PO) Take 1 tablet by mouth daily.   . pantoprazole (PROTONIX) 40 MG tablet Take 1 tablet (40 mg total) daily by mouth. Take 30-60 min before first meal of the day  . Pseudoephedrine-APAP-DM (DAYTIME COLD/FLU PO) Take by mouth as needed.  . simvastatin (ZOCOR) 40 MG tablet TAKE 1 TABLET BY MOUTH AT BEDTIME                  Objective:   Physical Exam   amb  mod obese wf nad   02/21/2017    184  01/24/2017     186  09/24/2014       164      07/14/14 155 lb 14.4 oz (70.716 kg)  06/29/14 164 lb 6.4 oz (74.571 kg)  03/02/14 156 lb 1.6 oz (70.806 kg)    Vital signs reviewed - Note on arrival 02 sats  95% on RA     HEENT: nl dentition, turbinates bilaterally, and oropharynx. Nl external ear canals without cough reflex   NECK :  without JVD/Nodes/TM/ nl carotid upstrokes bilaterally   LUNGS: no acc muscle use,  Nl contour chest which is clear to A and P bilaterally without cough on insp or exp maneuvers   CV:  RRR  no s3 or murmur or increase in P2, and no edema   ABD:  soft and  nontender with nl inspiratory excursion in the supine position. No bruits or organomegaly appreciated, bowel sounds nl  MS:  Nl gait/ ext warm without deformities, calf tenderness, cyanosis or clubbing No obvious joint restrictions   SKIN: warm and dry without lesions    NEURO:  alert, approp, nl sensorium with  no motor or cerebellar deficits apparent.                 Assessment & Plan:

## 2017-02-21 NOTE — Assessment & Plan Note (Addendum)
Onset 2010 ? Only response was to tramadol - Allergy profile 01/24/2017 >  Eos 0.4 /  IgE  434  RAST pos dust and mold - 01/24/2017 max rx for gerd/1st gen H1 blockers per guidelines  Focusing on noct cough 1st > resolved 02/21/2017    Now that finally has turned the corner the challenge is to keep her on the rx most likely to both maintain cough free but also with minimal need for prns but this won't work if pt/caretaker don't understand difference between maint and prns and return with all meds in hand using a trust but verify approach to confirm accurate Medication  Reconciliation The principal here is that until we are certain that the  patients are doing what we've asked, it makes no sense to ask them to do more.     I had an extended discussion with the patient/caretaker reviewing all relevant studies completed to date and  lasting 15 to 20 minutes of a 25 minute visit    Each maintenance medication was reviewed in detail including most importantly the difference between maintenance and prns and under what circumstances the prns are to be triggered using an action plan format that is not reflected in the computer generated alphabetically organized AVS.    Please see AVS for specific instructions unique to this visit that I personally wrote and verbalized to the the pt in detail and then reviewed with pt  by my nurse highlighting any  changes in therapy recommended at today's visit to their plan of care.

## 2017-05-17 ENCOUNTER — Encounter: Payer: Self-pay | Admitting: Physician Assistant

## 2017-05-23 ENCOUNTER — Ambulatory Visit: Payer: Medicare Other | Admitting: Internal Medicine

## 2017-05-23 ENCOUNTER — Encounter: Payer: Self-pay | Admitting: Internal Medicine

## 2017-05-23 ENCOUNTER — Other Ambulatory Visit: Payer: Self-pay | Admitting: Internal Medicine

## 2017-05-23 VITALS — BP 126/72 | HR 68 | Ht 61.0 in | Wt 186.6 lb

## 2017-05-23 DIAGNOSIS — R059 Cough, unspecified: Secondary | ICD-10-CM

## 2017-05-23 DIAGNOSIS — R05 Cough: Secondary | ICD-10-CM

## 2017-05-23 NOTE — Progress Notes (Signed)
Subjective:    Patient ID: Kelly Conrad, female    DOB: 1931/08/14     MRN: [id number]   History of Present Illness   Admit date: 06/28/2014 Discharge date: 07/04/2014   Discharge Diagnoses:  Principal Problem:  CAP (community acquired pneumonia)/ H flu on BC x 1 /2 ( beta lact neg)  Essential hypertension  Osteoporosis  Dementia  Benign hypertensive renal disease  Sepsis  Acute respiratory failure  Shortness of breath      Filed Weights   06/28/14 2230 06/29/14 0106  Weight: 70.761 kg (156 lb) 74.571 kg (164 lb 6.4 oz)    History of present illness:  Chief Complaint: fever 84 yo female h/o dementia presented to ER with one day of high fever and cough  Hospital Course:  Acute hypoxic resp failure -due to CAP and Diastolic CHF from fluid resuscitation -much improved -got multiple lasix doses 4/19, now off -ECHO with normal EF and wall motion -see Abx below  Community acquired pneumonia -Blood Cx with hemophilus influenzae 1/2 -clinically improved, was initially on broad spectrum antibiotics then change to PO Levaquin  Acute diastolic CHF -due to volume resuscitation -improved, ECHO with normal EF -only required 2 doses of Iv lasix on 2nd day of hospitalization, off diuretics since. -need for long term diuretics to be assessed upon follow up  Sepsis:  -with fever/leukocytosis/lactic acidosis on admission -improved  ARF on CKF III -cr 1.9 on admission, improved to 1.5 at discharge  Hyperglycemia: -no prior diagnosis of DM2, hbaic 6.0  H/o Dementia: mild, has been on aricept for about a year, AAOX3, but impaired memory      08/27/2014 1st [facility] office visit/ Wert   Chief Complaint  Patient presents with  . Pulmonary Consult    Referred by Dr Regis Bill. Family reports recent PNA treated by antibiotics. Pt c/o SOB with activity, and productive cough  never smoker with  h/o cough x  2010 worse in last 6 months eval by Dr  Joni Fears prev attributed to ACEi,  better  with tramadol > resolved at this point and feels back to baseline Imp late parapneumonic effusion, resolved  rec No change rx    01/24/2017  f/u ov/Wert re:  Persistent cough x 2010 , now says never resolved (note hx not consistent but may be the tramadol rx she was using at last ov suppressed the symptom to where it "resolved"  Chief Complaint  Patient presents with  . Acute Visit    Increased cough for the past year.    cough x 2010  Min mucoid production Comes and goes during the day sporadically = a couple of coughs = a spell min mucoid sputum then resolves "ith no pattern at all" but "oh by the way" every night around 2-3 am caretaker aware than she's coughing  Not limited by breathing from desired activities  But by fatigue/ unsteady on feet  REC mucinex dm 1200 mg every 12hours is the most cough medication you should be using, no other over the counters For drainage / throat tickle try take CHLORPHENIRAMINE  4 mg - take one every 4 hours as needed - available over the counter- may cause drowsiness so start with just a bedtime dose or two and see how you tolerate it before trying in daytime   Pantoprazole (protonix) 40 mg   Take  30-60 min before first meal of the day and Pepcid (famotidine)  20 mg one @  bedtime until return to office -  this is the best way to tell whether stomach acid is contributing to your problem.    02/21/2017  f/u ov/Wert re: chronic cough x 2010 / resolved Not limited by breathing from desired activities   No noct symptoms or coughing at all Caretaker confused between maint and prns and giving both  rec Continue Pantoprazole (protonix) 40 mg   Take  30-60 min before first meal of the day and Pepcid (famotidine)  20 mg one @  bedtime until return to office - this is the best way to tell whether stomach acid is contributing to your problem.  If needed: Cough/ congestion >>>>  mucinex dm 1200 mg every 12hours is the  most cough medication you should be using, no other over the counters For drainage / throat tickle/ itching/ sneezing/nose running >>> try take CHLORPHENIRAMINE  4 mg - take one every 4 hours as needed - available over the counter- may cause drowsiness so start with just a bedtime dose or two and see how you tolerate it before trying in daytime   Please schedule a follow up visit in 3 months but call sooner if needed  with all medications /inhalers/ solutions in hand so we can verify exactly what you are taking. This includes all medications from all doctors and over the counters    05/23/2017  f/u ov/Wert re: uacs/  maint on gerd rx/ rare need for h1  Chief Complaint  Patient presents with  . Follow-up    Not coughing much unless she goes outside "everything is blooming".    Dyspnea:  Not limited by breathing from desired activities   Cough: only outdoors/ never tries h1 daytime as rec Sleep: ok SABA use: no   No obvious day to day or daytime variability or assoc excess/ purulent sputum or mucus plugs or hemoptysis or cp or chest tightness, subjective wheeze or overt sinus or hb symptoms. No unusual exposure hx or h/o childhood pna/ asthma or knowledge of premature birth.  Sleeping ok flat without nocturnal  or early am exacerbation  of respiratory  c/o's or need for noct saba. Also denies any obvious fluctuation of symptoms with weather or environmental changes or other aggravating or alleviating factors except as outlined above   Current Allergies, Complete Past Medical History, Past Surgical History, Family History, and Social History were reviewed in [employer] record.  ROS  The following are not active complaints unless bolded Hoarseness, sore throat, dysphagia, dental problems, itching, sneezing,  nasal congestion or discharge of excess mucus or purulent secretions, ear ache,   fever, chills, sweats, unintended wt loss or wt gain, classically pleuritic or  exertional cp,  orthopnea pnd or leg swelling, presyncope, palpitations, abdominal pain, anorexia, nausea, vomiting, diarrhea  or change in bowel habits or change in bladder habits, change in stools or change in urine, dysuria, hematuria,  rash, arthralgias, visual complaints, headache, numbness, weakness or ataxia or problems with walking or coordination,  change in mood/affect or memory.        Current Meds  Medication Sig  . aspirin 81 MG tablet Take 81 mg daily by mouth.  . Cyanocobalamin (VITAMIN B-12 PO) Take 1 tablet daily by mouth.  . famotidine (PEPCID) 20 MG tablet One at bedtime  . guaifenesin (MUCUS RELIEF) 400 MG TABS tablet Take 400 mg by mouth every 4 (four) hours as needed.  . hydrochlorothiazide (MICROZIDE) 12.5 MG capsule TAKE 1 CAPSULE (12.5 MG TOTAL) BY MOUTH DAILY.  . Multiple Vitamins-Minerals (CENTRUM  SILVER PO) Take 1 tablet by mouth daily.   . simvastatin (ZOCOR) 40 MG tablet TAKE 1 TABLET BY MOUTH AT BEDTIME  . [DISCONTINUED] pantoprazole (PROTONIX) 40 MG tablet Take 1 tablet (40 mg total) daily by mouth. Take 30-60 min before first meal of the day                Objective:   Physical Exam   amb mod obese wf nad   05/23/2017       186  02/21/2017     184  01/24/2017     186  09/24/2014       164      07/14/14 155 lb 14.4 oz (70.716 kg)  06/29/14 164 lb 6.4 oz (74.571 kg)  03/02/14 156 lb 1.6 oz (70.806 kg)    Vital signs reviewed - Note on arrival 02 sats  95% on RA     HEENT: nl dentition, turbinates bilaterally, and oropharynx. Nl external ear canals without cough reflex   NECK :  without JVD/Nodes/TM/ nl carotid upstrokes bilaterally   LUNGS: no acc muscle use,  Nl contour chest which is clear to A and P bilaterally without cough on insp or exp maneuvers   CV:  RRR  no s3 or murmur or increase in P2, and no edema   ABD:  soft and nontender with nl inspiratory excursion in the supine position. No bruits or organomegaly appreciated, bowel  sounds nl  MS:  Nl gait/ ext warm without deformities, calf tenderness, cyanosis or clubbing No obvious joint restrictions   SKIN: warm and dry without lesions    NEURO:  alert, approp, nl sensorium with  no motor or cerebellar deficits apparent.              Assessment & Plan:

## 2017-05-23 NOTE — Patient Instructions (Signed)
For drainage / throat tickle try take CHLORPHENIRAMINE  4 mg - take one every 4 hours as needed - available over the counter- may cause drowsiness so start with just a bedtime dose or two and see how you tolerate it before trying in daytime     If you are satisfied with your treatment plan,  let your doctor know and he/she can either refill your medications or you can return here when your prescription runs out.     If in any way you are not 100% satisfied,  please tell us.  If 100% better, tell your friends!  Pulmonary follow up is as needed

## 2017-05-26 ENCOUNTER — Encounter: Payer: Self-pay | Admitting: Internal Medicine

## 2017-05-26 NOTE — Assessment & Plan Note (Signed)
Onset 2010 ? Only response was to tramadol - Allergy profile 01/24/2017 >  Eos 0.4 /  IgE  434  RAST pos dust and mold - 01/24/2017 max rx for gerd/1st gen H1 blockers per guidelines  Focusing on noct cough 1st > resolved 02/21/2017   Despite remaining complaint of daytime cough flaring with outdoor exposure, her allergies are mostly indoor/ perennial by her allergy profile and if rhinitis not well controlled with otcs then reasonable to refer to allergy for further w/u but no need for pulmonary f/u at this point  Each maintenance medication was reviewed in detail including most importantly the difference between maintenance and as needed and under what circumstances the prns are to be used.  Please see AVS for specific  Instructions which are unique to this visit and I personally typed out  which were reviewed in detail in writing with the patient and a copy provided.

## 2017-06-06 ENCOUNTER — Telehealth: Payer: Self-pay | Admitting: Physician Assistant

## 2017-06-06 ENCOUNTER — Ambulatory Visit: Payer: Medicare Other | Admitting: Physician Assistant

## 2017-06-06 ENCOUNTER — Other Ambulatory Visit: Payer: Self-pay | Admitting: Physician Assistant

## 2017-06-06 ENCOUNTER — Encounter: Payer: Self-pay | Admitting: Physician Assistant

## 2017-06-06 VITALS — BP 102/64 | HR 72 | Ht 61.0 in | Wt 188.0 lb

## 2017-06-06 DIAGNOSIS — I10 Essential (primary) hypertension: Secondary | ICD-10-CM | POA: Diagnosis not present

## 2017-06-06 DIAGNOSIS — N183 Chronic kidney disease, stage 3 unspecified: Secondary | ICD-10-CM

## 2017-06-06 DIAGNOSIS — R0609 Other forms of dyspnea: Secondary | ICD-10-CM | POA: Diagnosis not present

## 2017-06-06 DIAGNOSIS — E782 Mixed hyperlipidemia: Secondary | ICD-10-CM | POA: Diagnosis not present

## 2017-06-06 MED ORDER — FUROSEMIDE 20 MG PO TABS: 20.0000 mg | ORAL_TABLET | ORAL | 1 refills | Status: DC

## 2017-06-06 NOTE — Telephone Encounter (Signed)
Script clarified with pharmacist .Adonis Housekeeper

## 2017-06-06 NOTE — Progress Notes (Signed)
Cardiology Office Note    Date:  06/06/2017   ID:  Kelly Conrad, DOB 21-Oct-1931, MRN [id number]  PCP:  Leeroy Cha, MD  Cardiologist:  New to [facility]  Chief Complaint: Dyspnea  History of Present Illness:   Kelly Conrad is a 84 y.o. female with hx of HTN, HLD, CKD stage III dementia and asthma referred by Dr. Fara Olden for evaluation of dyspnea on exertion.  Echo 06/2014 showed LVEF of 60-65% and grade 1 DD.   Her allergies and asthma followed by Dr. Mertha Finders.   Here today with care taker. She has chronic dyspnea on exercise for may years. Recently worsen. No associated chest pressure.  She is barely able to walk one room to another room.  Caretaker has noted intermittent orthopnea without PND.  She goes to [facility] 2 times per week and does bicycling without any shortness of breath.  Patient and caretaker both eats outside a lot.  Denies palpitation, dizziness, melena, syncope or blood in her stool or urine.  No prior cardiac history.  Non-smoker.  Past Medical History:  Diagnosis Date  . Allergy   . Arthritis    hip, hands   . Asthma   . Dementia    pt. oriented to person , place & time. , very cooperative with interview   . GERD (gastroesophageal reflux disease)   . History of positive PPD    gets c xray screen  . Hx of varicella   . Hyperlipidemia   . Hypertension   . Pneumonia    06/2014- hosp.     Past Surgical History:  Procedure Laterality Date  . EYE SURGERY Bilateral    cataracts removed, followed by /w IOL  . FEMUR IM NAIL Left 04/20/2015   Procedure: INTRAMEDULLARY (IM) NAIL FEMORAL;  Surgeon: Rod Can, MD;  Location: [facility];  Service: Orthopedics;  Laterality: Left;  . HARDWARE REMOVAL Left 09/26/2015   Procedure: HARDWARE REMOVAL LEFT FEMUR;  Surgeon: Rod Can, MD;  Location: [place];  Service: Orthopedics;  Laterality: Left;  . TOTAL HIP ARTHROPLASTY Left 09/26/2015   Procedure: LEFT HIP TOTAL ARTHROPLASTY POSTERIOR ;  Surgeon: Rod Can,  MD;  Location: [place];  Service: Orthopedics;  Laterality: Left;    Current Medications: Prior to Admission medications   Medication Sig Start Date End Date Taking? Authorizing Provider  aspirin 81 MG tablet Take 81 mg daily by mouth.   Yes [provider]  Calcium Carbonate-Vitamin D (CALCIUM-VITAMIN D3 PO) Take 600 mg by mouth daily.   Yes [provider]  Cyanocobalamin (VITAMIN B-12 PO) Take 1 tablet daily by mouth.   Yes [provider]  diphenhydrAMINE (BENADRYL) 25 MG tablet Take 25 mg by mouth every 6 (six) hours as needed for allergies.   Yes [provider]  famotidine (PEPCID) 20 MG tablet Take 20 mg by mouth at bedtime. If starts to cough   Yes [provider]  hydrochlorothiazide (MICROZIDE) 12.5 MG capsule TAKE 1 CAPSULE (12.5 MG TOTAL) BY MOUTH DAILY. 12/21/15  Yes Panosh, Standley Brooking, MD  Multiple Vitamins-Minerals (CENTRUM SILVER PO) Take 1 tablet by mouth daily.    Yes [provider]  Omega-3 Fatty Acids (FISH OIL) 1000 MG CPDR Take 1 tablet by mouth daily.   Yes [provider]  pantoprazole (PROTONIX) 40 MG tablet TAKE 1 TABLET(40 MG) BY MOUTH DAILY 30 TO 60 MINUTES BEFORE FIRST MEAL OF THE DAY 05/24/17  Yes Tanda Rockers, MD  simvastatin (ZOCOR) 40 MG  tablet TAKE 1 TABLET BY MOUTH AT BEDTIME 01/18/16  Yes Panosh, Standley Brooking, MD  vitamin C (ASCORBIC ACID) 500 MG tablet Take 500 mg by mouth daily.   Yes [provider]   Allergies:   No known allergies   Social History   Socioeconomic History  . Marital status: Single    Spouse name: Not on file  . Number of children: Not on file  . Years of education: Not on file  . Highest education level: Not on file  Occupational History  . Not on file  Social Needs  . Financial resource strain: Not on file  . Food insecurity:    Worry: Not on file    Inability: Not on file  . Transportation needs:    Medical: Not on file    Non-medical: Not on file  Tobacco  Use  . Smoking status: Never Smoker  . Smokeless tobacco: Never Used  Substance and Sexual Activity  . Alcohol use: No  . Drug use: No  . Sexual activity: Never  Lifestyle  . Physical activity:    Days per week: Not on file    Minutes per session: Not on file  . Stress: Not on file  Relationships  . Social connections:    Talks on phone: Not on file    Gets together: Not on file    Attends religious service: Not on file    Active member of club or organization: Not on file    Attends meetings of clubs or organizations: Not on file    Relationship status: Not on file  Other Topics Concern  . 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 [name]    He is the main caretaker. Luka took care of his mom  Who has since passed away.   He has some vision problems and partial amputation but  No mobiilty problems       Neg ets FA safely stored smoke alarm   No tob or etoh.     Family History:  The patient's family history includes Diabetes in her father and mother; Edema in her father; Hypertension in her unknown relative; Stroke in her mother.   ROS:   Please see the history of present illness.    ROS All other systems reviewed and are negative.   PHYSICAL EXAM:   VS:  BP 102/64   Pulse 72   Ht 5\' 1"  (1.549 m)   Wt 188 lb (85.3 kg)   SpO2 91%   BMI 35.52 kg/m    GEN: Elderly obese female nn no acute distress  HEENT: normal  Neck: no JVD, carotid bruits, or masses Cardiac:RRR; no murmurs, rubs, or gallops, trace ankle edema Respiratory:   normal work of breathing, faint bibasilar rales GI: soft, nontender, nondistended, + BS MS: no deformity or atrophy  Skin: warm and dry, no rash Neuro:  Alert and Oriented x 3, Strength and sensation are intact Psych: euthymic mood, full affect  Wt Readings from Last 3 Encounters:  06/06/17 188 lb (85.3 kg)  05/23/17 186 lb 9.6 oz (84.6 kg)  02/21/17  184 lb (83.5 kg)      Studies/Labs Reviewed:   EKG:  EKG is ordered today.  The ekg ordered today demonstrates sinus rhythm at rate of 72 bpm and right bundle branch block  Recent Labs: 01/24/2017: Hemoglobin 14.4; Platelets 221.0; Pro B Natriuretic  peptide (BNP) 147.0; TSH 2.49   Lipid Panel    Component Value Date/Time   CHOL 226 (H) 11/11/2014 1010   TRIG 231.0 (H) 11/11/2014 1010   HDL 50.50 11/11/2014 1010   CHOLHDL 4 11/11/2014 1010   VLDL 46.2 (H) 11/11/2014 1010   LDLCALC 112 (H) 03/02/2014 0857   LDLDIRECT 107.0 11/11/2014 1010    Additional studies/ records that were reviewed today include:   Echocardiogram: 06/2014 Study Conclusions  - Left ventricle: The cavity size was normal. Wall thickness was normal. Systolic function was normal. The estimated ejection fraction was in the range of 60% to 65%. Wall motion was normal; there were no regional wall motion abnormalities. Doppler parameters are consistent with abnormal left ventricular relaxation (grade 1 diastolic dysfunction). - Pericardium, extracardiac: A trivial pericardial effusion was identified posterior to the heart.    ASSESSMENT & PLAN:    1. Dyspnea on exertion Seems multifactorial from obesity, deconditioning and possible diastolic heart failure.  She denies any exertional chest pain/pressure.  She goes to [facility] and does rides bicycle without dyspnea.  Questionable intermittent orthopnea.  Likely her symptoms exacerbated by excess salt intake. Advised to cut back. Mild volume overload noted by exam.  She has a chronic right bundle branch block.  Less suspicious for ischemic etiology. -Will check BNP and BMET..Low-dose Lasix for 3 days instead of hydrochlorothiazide.  Caregiver will call us Monday and let us know how she felt on Lasix instead of HCTZ.  2.  Hypertension -Low normal during office visit.  Medication changes as above.  3.  Hyperlipidemia -Continue statin.  LDL 90 on  11/2016.  4.  Chronic kidney disease stage III -Bmet today. Scr 1.3 on 11/2016.  Reviewed with DOD Dr. Rayann Heman.     Medication Adjustments/Labs and Tests Ordered: Current medicines are reviewed at length with the patient today.  Concerns regarding medicines are outlined above.  Medication changes, Labs and Tests ordered today are listed in the Patient Instructions below. There are no Patient Instructions on file for this visit.   Jarrett Soho, [place]  06/06/2017 3:08 PM    [facility] [address], [place], [place]  [address] Phone: [phone]; Fax: [phone]

## 2017-06-06 NOTE — Patient Instructions (Signed)
Your physician has recommended you make the following change in your medication: TAKE FUROSEMIDE [place]   Your physician recommends that you return for lab work in:  [place]  BNP  CALL Monday WITH UPDATE   Your physician has requested that you have an echocardiogram. Echocardiography is a painless test that uses sound waves to create images of your heart. It provides your doctor with information about the size and shape of your heart and how well your heart's chambers and valves are working. This procedure takes approximately one hour. There are no restrictions for this procedure.    Your physician recommends that you schedule a follow-up appointment in:  2-3 WEEKS WITH VIN

## 2017-06-06 NOTE — Telephone Encounter (Signed)
Pt c/o medication issue:  1. Name of Medication: Furosemide  2. How are you currently taking this medication (dosage and times per day)? 20 mg  3. Are you having a reaction (difficulty breathing--STAT)? no  4. What is your medication issue? Tiffany calling with [facility] about prescription states that the instructions state to take 1 tablet for 3 days but the quantity is for 30 pills. Tiffany would like to verify the dosage instructions.

## 2017-06-07 LAB — BASIC METABOLIC PANEL
BUN / CREAT RATIO: 16 (ref 12–28)
BUN: 23 mg/dL (ref 8–27)
CALCIUM: 9.7 mg/dL (ref 8.7–10.3)
CHLORIDE: 101 mmol/L (ref 96–106)
CO2: 23 mmol/L (ref 20–29)
CREATININE: 1.44 mg/dL — AB (ref 0.57–1.00)
GFR, EST AFRICAN AMERICAN: 38 mL/min/{1.73_m2} — AB (ref >59)
GFR, EST NON AFRICAN AMERICAN: 33 mL/min/{1.73_m2} — AB (ref >59)
Glucose: 91 mg/dL (ref 65–99)
Potassium: 4.4 mmol/L (ref 3.5–5.2)
Sodium: 140 mmol/L (ref 134–144)

## 2017-06-07 LAB — PRO B NATRIURETIC PEPTIDE: NT-PRO BNP: 267 pg/mL (ref 0–738)

## 2017-06-10 ENCOUNTER — Telehealth: Payer: Self-pay | Admitting: Physician Assistant

## 2017-06-10 NOTE — Telephone Encounter (Signed)
Agree. Her most of symptoms is due to deconditioning and obesity. Recent BNP was normal. She should resume her HCTZ today. Stop lasix. Follow up as planned with repeat BMET. Thanks.

## 2017-06-10 NOTE — Telephone Encounter (Signed)
MR. Kelly Conrad is calling to let you know [name] is doing and how she is doing on the medication . Please call

## 2017-06-10 NOTE — Telephone Encounter (Signed)
Returned call to [name], patient's friend/caretaker (DPR on file). Mr. [name] states that the pateint was seen by Robbie Lis, PA on 3/28. He states that the patient was instructed to hold HCTZ, take lasix 20 mg QD x 3 days, and then resume HCTZ 12.5 mg QD. He states that he was suppose to call and report how the patient did on the lasix. Mr. [name] states that he did not notice a difference while the patient was taking the lasix. He states that he doesn't feel like the patient has a lot of extra fluid. He states that he thinks that the patient's symptoms are due to being overweight. He states that he is going to continue taking the patient to the [facility] and work on the patient's diet. He states that he still plans on keeping the echocardiogram appointment on 4/4 and f/u appt on 4/23. He wanted to call and make Vin aware. Made him aware that I would forward the information.

## 2017-06-13 ENCOUNTER — Ambulatory Visit (HOSPITAL_COMMUNITY): Payer: Medicare Other | Attending: Cardiology

## 2017-06-13 ENCOUNTER — Other Ambulatory Visit: Payer: Self-pay

## 2017-06-13 DIAGNOSIS — I10 Essential (primary) hypertension: Secondary | ICD-10-CM | POA: Insufficient documentation

## 2017-06-13 DIAGNOSIS — R0609 Other forms of dyspnea: Secondary | ICD-10-CM

## 2017-06-13 DIAGNOSIS — I082 Rheumatic disorders of both aortic and tricuspid valves: Secondary | ICD-10-CM | POA: Diagnosis not present

## 2017-06-17 ENCOUNTER — Telehealth: Payer: Self-pay | Admitting: Physician Assistant

## 2017-06-17 NOTE — Telephone Encounter (Signed)
Left message on mobile number for patient to call back. Home number is disconnected

## 2017-06-17 NOTE — Telephone Encounter (Signed)
New message  [name] verbalized that he is returning call for RN  He stated that he is returning call for rn again   rn said she will call back  [name] verbalized that he will come up here

## 2017-06-17 NOTE — Telephone Encounter (Signed)
Patient and caregiver presented in person to get results of echo. Patient's caregiver, Kelly Conrad, states he was missing our calls and phone was not ringing. I verified his phone number in patient's chart. I reviewed echo results with them and answered questions to their satisfaction. Kelly Conrad states he is concerned about patient's "panting" when she wakes up. I reviewed notes from office visit with Kelly Luz, PA on 3/28 and asked about diet. Kelly Conrad admits that they continue to eat a high sodium diet. I encouraged him to work on this and to continue to monitor symptoms. I advised him to call back with additional questions prior to follow-up appointment on 4/23 with Vin. Patient and caregiver were thankful for my help today.

## 2017-06-17 NOTE — Telephone Encounter (Signed)
Left message for patient to call back  

## 2017-06-17 NOTE — Telephone Encounter (Signed)
New Message ° ° °Pt returning call for nurse °

## 2017-06-21 ENCOUNTER — Encounter: Payer: Self-pay | Admitting: Physician Assistant

## 2017-07-01 NOTE — Progress Notes (Signed)
Cardiology Office Note    Date:  07/02/2017   ID:  Kelly Conrad, DOB 01/31/1932, MRN [id number]  PCP:  Leeroy Cha, MD  Cardiologist:  New to Dr. Rayann Heman (General cardiology to follow)  Pulmonary: Dr. Mertha Finders  Chief Complaint:  4 weeks follow up for dyspnea  History of Present Illness:   Kelly Conrad is a 84 y.o. female with hx of HTN, HLD, CKD stage III, dementia and asthma presents for follow up.   Echo 06/2014 showed LVEF of 60-65% and grade 1 DD.   Seen by me 06/06/16 for DOE for many years. Caretaker has noted intermittent orthopnea without PND.  She goes to [facility] 2 times per week and does bicycling without any shortness of breath. Felt multifactorial from obesity, deconditioning and possible diastolic heart failure.  She denies any exertional chest pain/pressure  BNP was normal. Trial of lasix for 3 days and then stop >> resume HCTZ. Echo 06/13/17 showed LVEF of 65-70 with grade 1 DD.   Here today for follow up with caregiver. No difference noted while on lasix. She has changed her diet and doing more walking. Lost 3 lb since last office visit. Her dyspnea has improved. No orthopnea, chest pain, palpitation, syncope or dizziness.    Past Medical History:  Diagnosis Date  . Allergy   . Arthritis    hip, hands   . Asthma   . Dementia    pt. oriented to person , place & time. , very cooperative with interview   . GERD (gastroesophageal reflux disease)   . History of positive PPD    gets c xray screen  . Hx of varicella   . Hyperlipidemia   . Hypertension   . Pneumonia    06/2014- hosp.     Past Surgical History:  Procedure Laterality Date  . EYE SURGERY Bilateral    cataracts removed, followed by /w IOL  . FEMUR IM NAIL Left 04/20/2015   Procedure: INTRAMEDULLARY (IM) NAIL FEMORAL;  Surgeon: Rod Can, MD;  Location: [facility];  Service: Orthopedics;  Laterality: Left;  . HARDWARE REMOVAL Left 09/26/2015   Procedure: HARDWARE REMOVAL LEFT FEMUR;  Surgeon: Rod Can, MD;  Location: [place];  Service: Orthopedics;  Laterality: Left;  . TOTAL HIP ARTHROPLASTY Left 09/26/2015   Procedure: LEFT HIP TOTAL ARTHROPLASTY POSTERIOR ;  Surgeon: Rod Can, MD;  Location: [place];  Service: Orthopedics;  Laterality: Left;    Current Medications: Prior to Admission medications   Medication Sig Start Date End Date Taking? Authorizing Provider  aspirin 81 MG tablet Take 81 mg daily by mouth.    [provider]  Calcium Carbonate-Vitamin D (CALCIUM-VITAMIN D3 PO) Take 600 mg by mouth daily.    [provider]  Cyanocobalamin (VITAMIN B-12 PO) Take 1 tablet daily by mouth.    [provider]  diphenhydrAMINE (BENADRYL) 25 MG tablet Take 25 mg by mouth every 6 (six) hours as needed for allergies.    [provider]  famotidine (PEPCID) 20 MG tablet Take 20 mg by mouth at bedtime. If starts to cough    [provider]  furosemide (LASIX) 20 MG tablet Reported on 06/10/2017 06/06/17   Leanor Kail, PA  hydrochlorothiazide (MICROZIDE) 12.5 MG capsule TAKE 1 CAPSULE (12.5 MG TOTAL) BY MOUTH DAILY. 12/21/15   Panosh, Standley Brooking, MD  Multiple Vitamins-Minerals (CENTRUM SILVER PO) Take 1 tablet by mouth daily.     [provider]  Omega-3 Fatty Acids (FISH OIL)  1000 MG CPDR Take 1 tablet by mouth daily.    [provider]  pantoprazole (PROTONIX) 40 MG tablet TAKE 1 TABLET(40 MG) BY MOUTH DAILY 30 TO 60 MINUTES BEFORE FIRST MEAL OF THE DAY 05/24/17   Tanda Rockers, MD  simvastatin (ZOCOR) 40 MG tablet TAKE 1 TABLET BY MOUTH AT BEDTIME 01/18/16   Panosh, Standley Brooking, MD  vitamin C (ASCORBIC ACID) 500 MG tablet Take 500 mg by mouth daily.    [provider]    Allergies:   No known allergies   Social History   Socioeconomic History  . Marital status: Single    Spouse name: Not on file  . Number of children: Not on file  . Years of education: Not on file  . Highest education level: Not on file    Occupational History  . Not on file  Social Needs  . Financial resource strain: Not on file  . Food insecurity:    Worry: Not on file    Inability: Not on file  . Transportation needs:    Medical: Not on file    Non-medical: Not on file  Tobacco Use  . Smoking status: Never Smoker  . Smokeless tobacco: Never Used  Substance and Sexual Activity  . Alcohol use: No  . Drug use: No  . Sexual activity: Never  Lifestyle  . Physical activity:    Days per week: Not on file    Minutes per session: Not on file  . Stress: Not on file  Relationships  . Social connections:    Talks on phone: Not on file    Gets together: Not on file    Attends religious service: Not on file    Active member of club or organization: Not on file    Attends meetings of clubs or organizations: Not on file    Relationship status: Not on file  Other Topics Concern  . 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 Kelly Conrad    He is the main caretaker. Ileane took care of his mom  Who has since passed away.   He has some vision problems and partial amputation but  No mobiilty problems       Neg ets FA safely stored smoke alarm   No tob or etoh.     Family History:  The patient's family history includes Diabetes in her father and mother; Edema in her father; Hypertension in her unknown relative; Stroke in her mother.   ROS:   Please see the history of present illness.    ROS All other systems reviewed and are negative.   PHYSICAL EXAM:   VS:  BP 120/62   Pulse 74   Ht 5\' 1"  (1.549 m)   Wt 185 lb 9.6 oz (84.2 kg)   SpO2 95%   BMI 35.07 kg/m    GEN: Well nourished, well developed, in no acute distress  HEENT: normal  Neck: no JVD, carotid bruits, or masses Cardiac: RRR; no murmurs, rubs, or gallops,no edema  Respiratory:  clear to auscultation bilaterally, normal work of breathing GI: soft, nontender, nondistended,  + BS MS: no deformity or atrophy  Skin: warm and dry, no rash Neuro:  Alert and Oriented x 3, Strength and sensation are intact Psych: euthymic mood, full affect  Wt Readings from Last 3 Encounters:  07/02/17 185 lb 9.6 oz (84.2 kg)  06/06/17 188 lb (85.3 kg)  05/23/17 186 lb 9.6 oz (84.6 kg)      Studies/Labs Reviewed:   EKG:  EKG is not ordered today.    Recent Labs: 01/24/2017: Hemoglobin 14.4; Platelets 221.0; TSH 2.49 06/06/2017: BUN 23; Creatinine, Ser 1.44; NT-Pro BNP 267; Potassium 4.4; Sodium 140   Lipid Panel    Component Value Date/Time   CHOL 226 (H) 11/11/2014 1010   TRIG 231.0 (H) 11/11/2014 1010   HDL 50.50 11/11/2014 1010   CHOLHDL 4 11/11/2014 1010   VLDL 46.2 (H) 11/11/2014 1010   LDLCALC 112 (H) 03/02/2014 0857   LDLDIRECT 107.0 11/11/2014 1010    Additional studies/ records that were reviewed today include:   Echocardiogram: 06/13/17 Study Conclusions  - Left ventricle: The cavity size was normal. Systolic function was   vigorous. The estimated ejection fraction was in the range of 65%   to 70%. Wall motion was normal; there were no regional wall   motion abnormalities. Doppler parameters are consistent with   abnormal left ventricular relaxation (grade 1 diastolic   dysfunction). Doppler parameters are consistent with elevated   ventricular end-diastolic filling pressure. - Aortic valve: There was mild regurgitation. - Left atrium: The atrium was mildly dilated. - Right ventricle: The cavity size was normal. Wall thickness was   normal. Systolic function was normal. - Right atrium: The atrium was normal in size. - Tricuspid valve: There was mild regurgitation. - Pulmonary arteries: Systolic pressure was within the normal   range. - Inferior vena cava: The vessel was normal in size.    ASSESSMENT & PLAN:    1. Dyspnea on exertion without chest pain - BNP normal Echo with normal LVEF and grade 1 DD. No change noted while on lasix. She is  eating more healthy food and walking. Feeling better now. Lost 3lb since last visit.  I have encouraged regular exercise. Her symptoms does not concern for ischemic evaluation.  She will continue HCTZ 12.5mg  qd. She takes PRN lasix 20mg  for dyspnea.   2. HTN - BP stable  3. HLD - Continue statin per PCP  4. CKD stage III - Scr stable.     Medication Adjustments/Labs and Tests Ordered: Current medicines are reviewed at length with the patient today.  Concerns regarding medicines are outlined above.  Medication changes, Labs and Tests ordered today are listed in the Patient Instructions below. Patient Instructions  Medication Instructions:  1. CHANGE LASIX 20 MG TO TAKE ONLY AS NEEDED FOR INCREASED SWELLING OR INCREASED SHORTNESS OF BREATH   2. CONTINUE ALL OTHER MEDICATIONS AS PRESCRIBED   Labwork: NONE ORDERED TODAY  Testing/Procedures: NONE ORDERED TODAY  Follow-Up: FOLLOW UP AS NEEDED  Any Other Special Instructions Will Be Listed Below (If Applicable).     If you need a refill on your cardiac medications before your next appointment, please call your pharmacy.      Jarrett Soho, [place]  07/02/2017 1:44 PM    [facility] [place], [place], [place]  [address] Phone: [phone]; Fax: [phone]

## 2017-07-02 ENCOUNTER — Ambulatory Visit: Payer: Medicare Other | Admitting: Physician Assistant

## 2017-07-02 ENCOUNTER — Encounter: Payer: Self-pay | Admitting: Physician Assistant

## 2017-07-02 VITALS — BP 120/62 | HR 74 | Ht 61.0 in | Wt 185.6 lb

## 2017-07-02 DIAGNOSIS — I10 Essential (primary) hypertension: Secondary | ICD-10-CM | POA: Diagnosis not present

## 2017-07-02 DIAGNOSIS — N183 Chronic kidney disease, stage 3 unspecified: Secondary | ICD-10-CM

## 2017-07-02 DIAGNOSIS — R0609 Other forms of dyspnea: Secondary | ICD-10-CM

## 2017-07-02 DIAGNOSIS — E782 Mixed hyperlipidemia: Secondary | ICD-10-CM

## 2017-07-02 MED ORDER — FUROSEMIDE 20 MG PO TABS: 20.0000 mg | ORAL_TABLET | ORAL | Status: DC | PRN

## 2017-07-02 NOTE — Patient Instructions (Signed)
Medication Instructions:  1. CHANGE LASIX 20 MG TO TAKE ONLY AS NEEDED FOR INCREASED SWELLING OR INCREASED SHORTNESS OF BREATH   2. CONTINUE ALL OTHER MEDICATIONS AS PRESCRIBED   Labwork: NONE ORDERED TODAY  Testing/Procedures: NONE ORDERED TODAY  Follow-Up: FOLLOW UP AS NEEDED  Any Other Special Instructions Will Be Listed Below (If Applicable).     If you need a refill on your cardiac medications before your next appointment, please call your pharmacy.

## 2017-09-26 IMAGING — DX DG FEMUR 2+V*L*
4 series · 4 of 4 positions shown · non-contrast
Comparison: April 19, 2015.

CLINICAL DATA: Proximal left femur fracture.

EXAM:
LEFT FEMUR 2 VIEWS

[femur ap (1 of 2)]
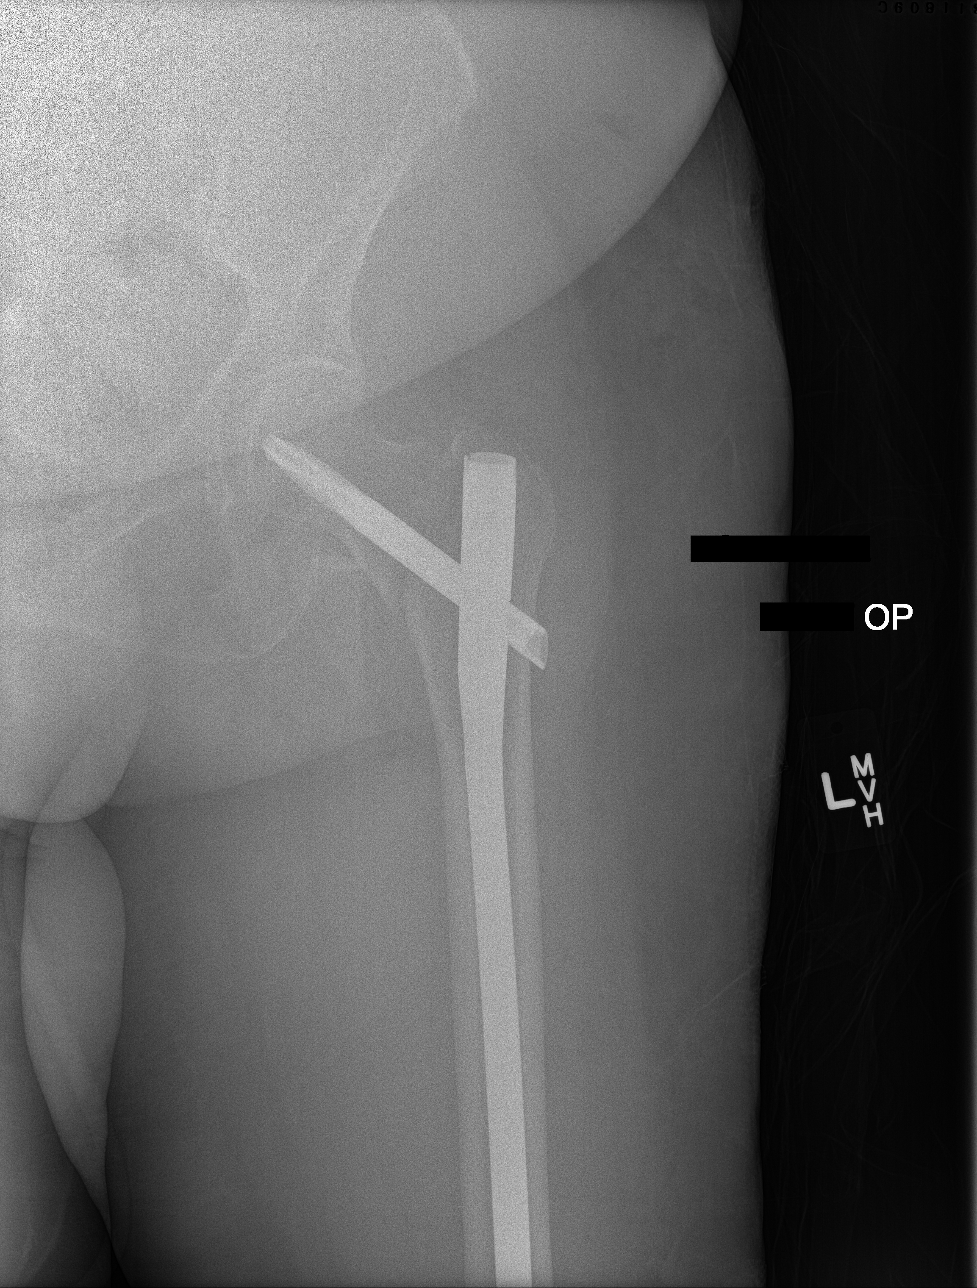

[femur lat (1 of 2)]
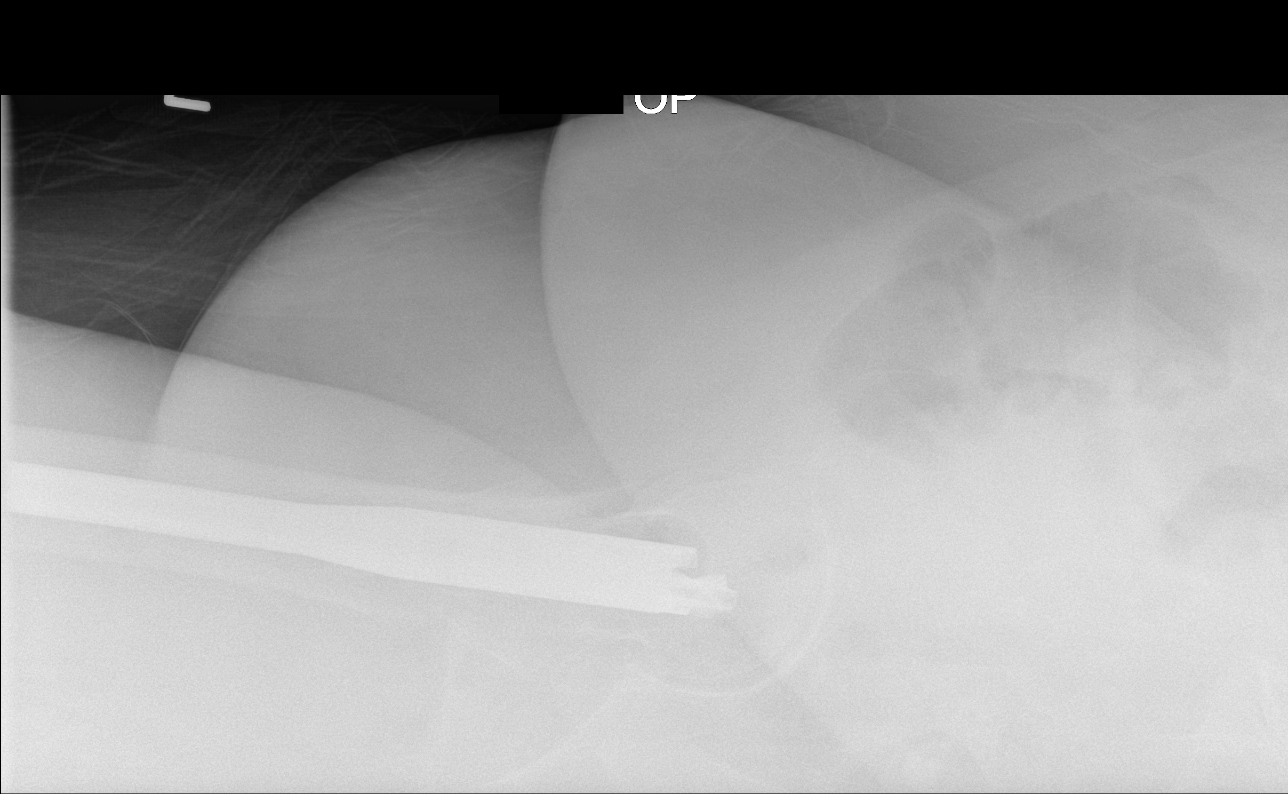

[femur lat (2 of 2)]
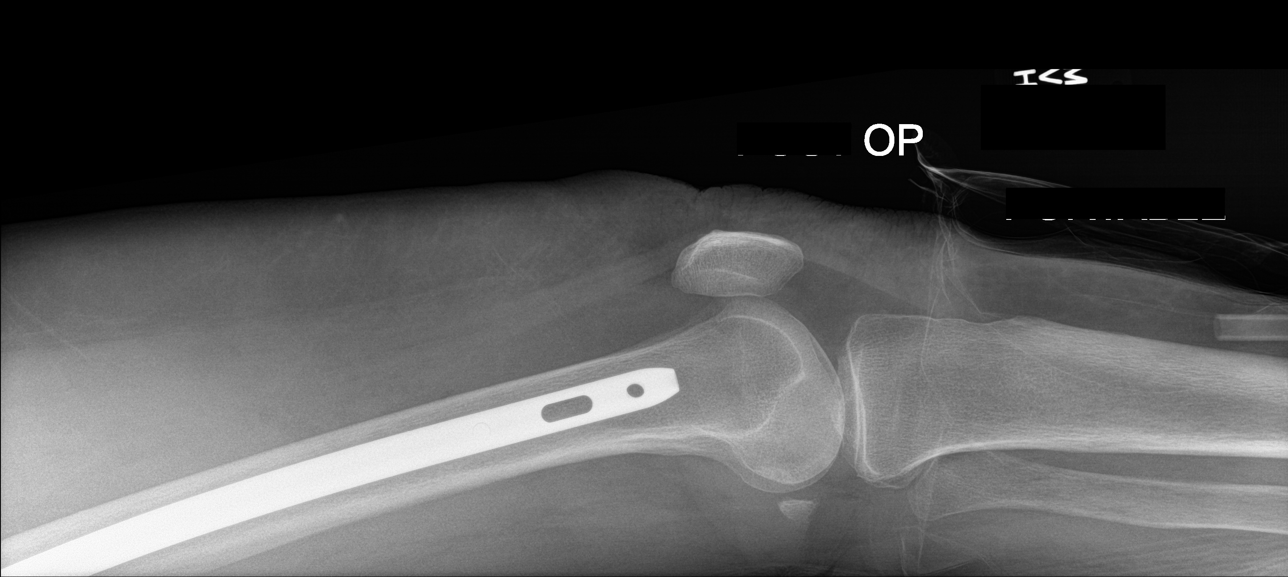

[femur ap (2 of 2)]
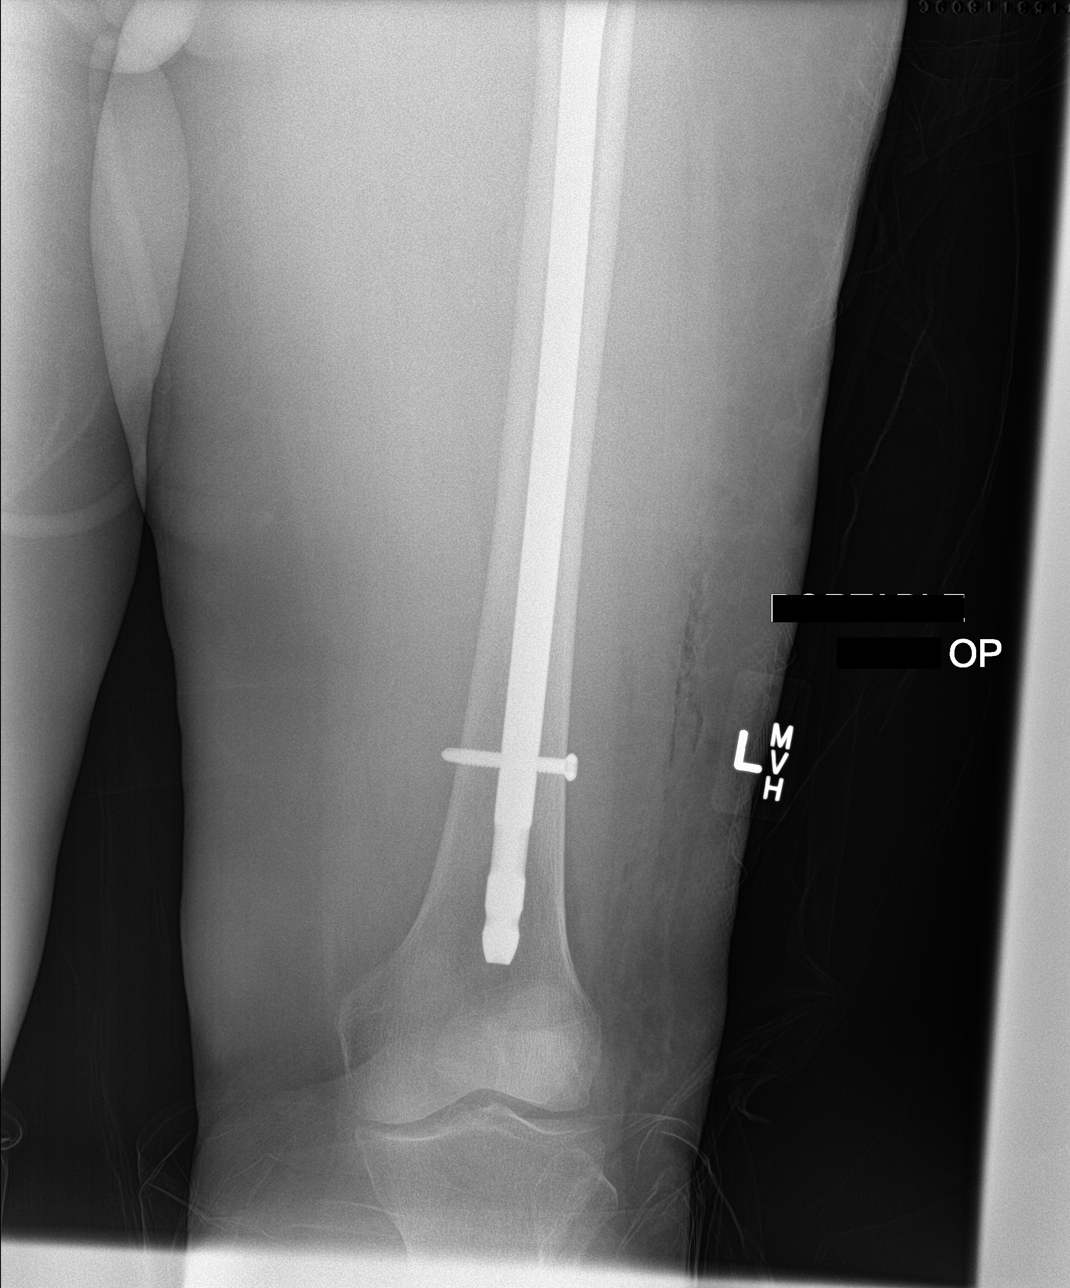

[4 of 4 positions shown; findings below may reference images not displayed]

FINDINGS: Status post intra medullary rod fixation of comminuted fracture of
the intertrochanteric proximal left femur. Improved alignment of
fracture components is noted.
IMPRESSION: Status post intra medullary rod fixation of proximal left femoral
fracture.

## 2017-09-29 IMAGING — DX DG CHEST 1V PORT
1 series · 1 of 1 positions shown · non-contrast
Comparison: April 19, 2015

CLINICAL DATA: Cough and fever

EXAM:
PORTABLE CHEST 1 VIEW

[chest ap]
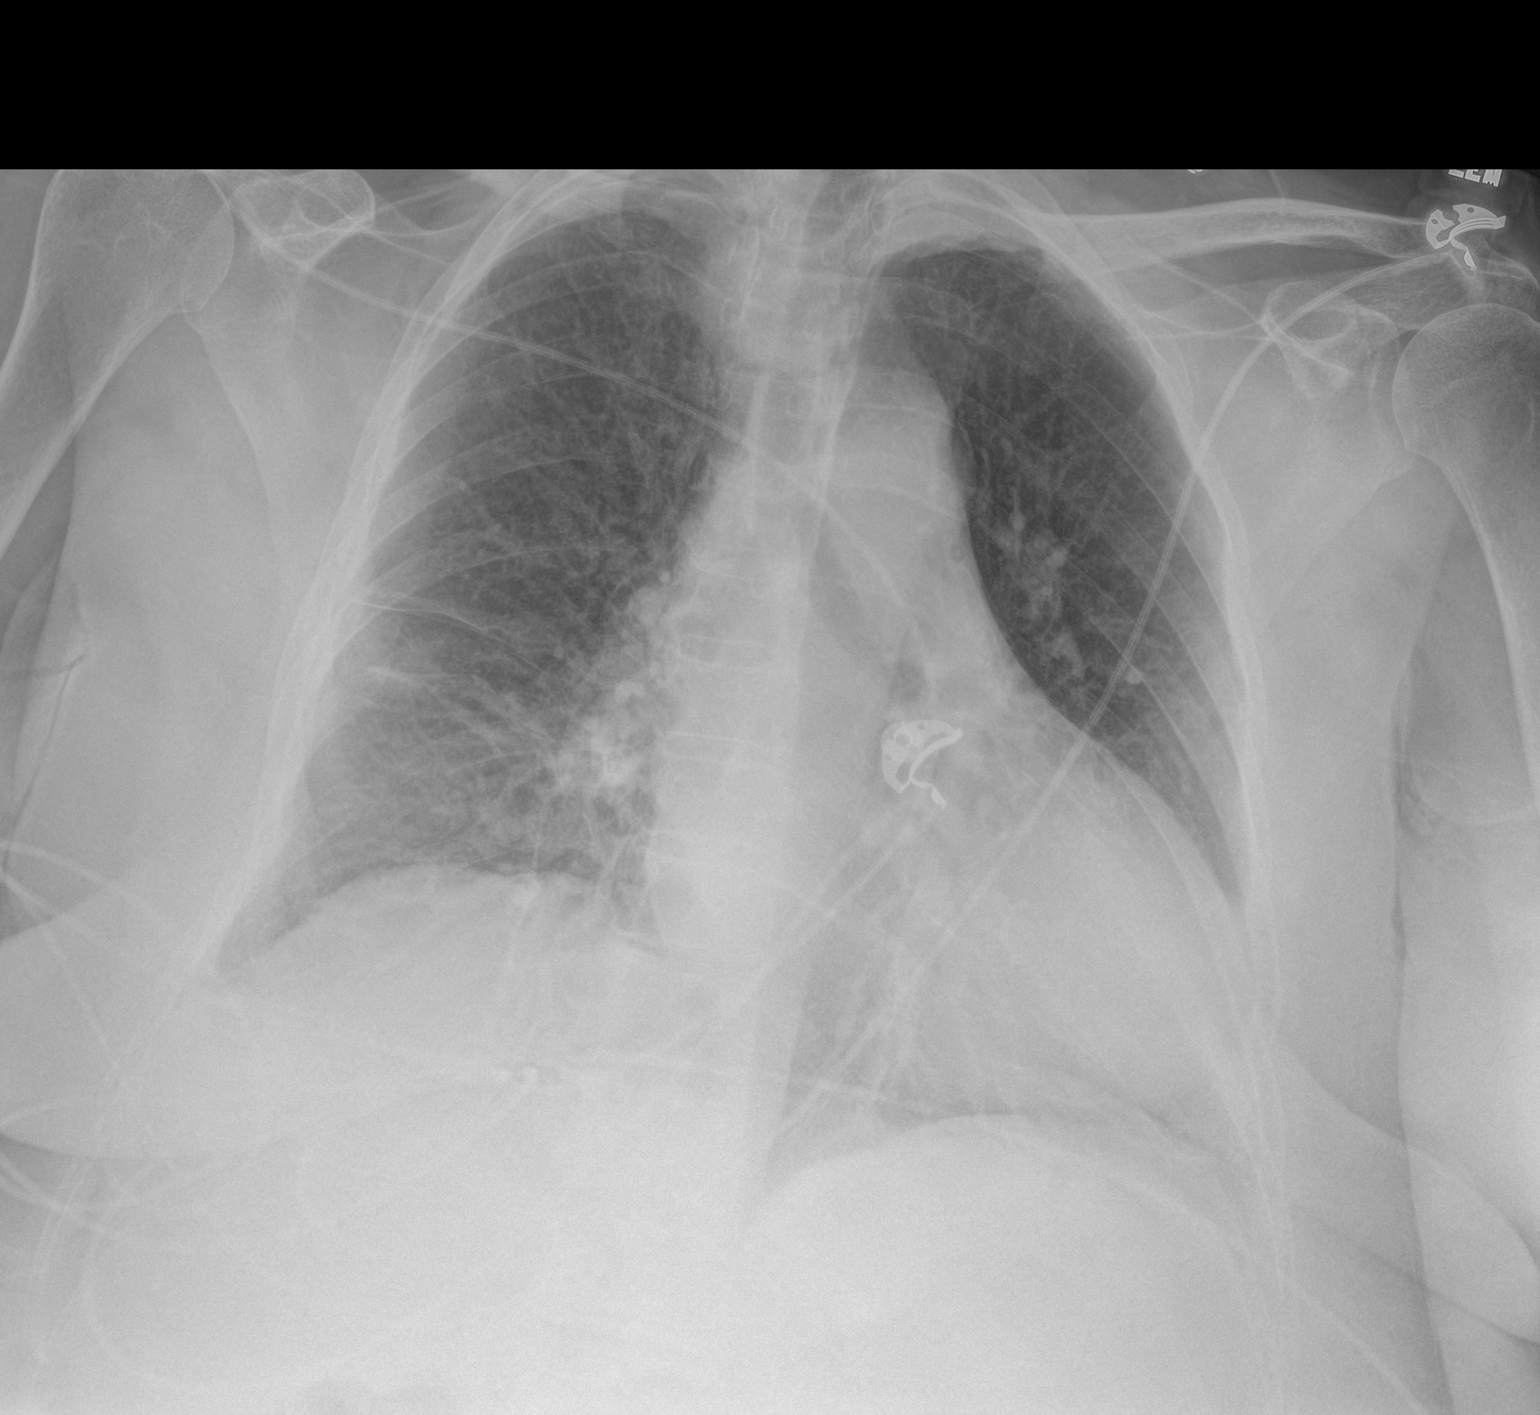

[1 of 1 positions shown; findings below may reference images not displayed]

FINDINGS: There is scarring in the right lower lung zone region. There is mild
elevation of the right hemidiaphragm. There is no edema or
consolidation. Heart is borderline enlarged with pulmonary
vascularity within normal limits. No adenopathy.
IMPRESSION: Scarring in the right lower lung zone. No edema or consolidation.
Stable cardiac prominence, stable. No appreciable change compared to
recent prior study.

## 2017-10-09 ENCOUNTER — Other Ambulatory Visit: Payer: Self-pay | Admitting: Internal Medicine

## 2017-10-09 DIAGNOSIS — R05 Cough: Secondary | ICD-10-CM

## 2017-10-09 DIAGNOSIS — R059 Cough, unspecified: Secondary | ICD-10-CM

## 2017-10-25 ENCOUNTER — Other Ambulatory Visit: Payer: Self-pay | Admitting: Internal Medicine

## 2017-10-25 DIAGNOSIS — R05 Cough: Secondary | ICD-10-CM

## 2017-10-25 DIAGNOSIS — R059 Cough, unspecified: Secondary | ICD-10-CM

## 2019-06-11 ENCOUNTER — Ambulatory Visit (INDEPENDENT_AMBULATORY_CARE_PROVIDER_SITE_OTHER): Payer: Medicare Other | Admitting: Allergy

## 2019-06-11 ENCOUNTER — Ambulatory Visit
Admission: RE | Admit: 2019-06-11 | Discharge: 2019-06-11 | Disposition: A | Discharge status: Home / Self Care | Payer: Medicare Other | Source: Ambulatory Visit | Attending: Allergy | Admitting: Allergy

## 2019-06-11 ENCOUNTER — Other Ambulatory Visit: Payer: Self-pay

## 2019-06-11 ENCOUNTER — Encounter: Payer: Self-pay | Admitting: Allergy

## 2019-06-11 VITALS — BP 116/70 | HR 78 | Temp 97.8°F | Resp 17 | Ht 61.5 in | Wt 172.0 lb

## 2019-06-11 DIAGNOSIS — R05 Cough: Secondary | ICD-10-CM | POA: Diagnosis not present

## 2019-06-11 DIAGNOSIS — R053 Chronic cough: Secondary | ICD-10-CM

## 2019-06-11 NOTE — Assessment & Plan Note (Signed)
Patient is a poor historian and most of the history was obtained through her caregiver [name]. Chronic cough with clear/whitish sputum for the last 1.5 years. No triggers noted. Only prednisone seems to resolve symptoms. Recently started on Wixela 250/50 for 1 week and using albuterol nebulizer as needed. No recent Chest-Xray. Patient was seen by pulmonology in the past and cardiology as well.  Patient could not perform proper spirometry due to poor effort/technique.   Patient/caregiver initially hesitant about care plan and recommended testing/labs and insisting that only prednisone works. Discussed the adverse effects associated with prolonged oral prednisone use.   Discussed at length that chronic cough can be caused by a multitude of disease processes including the following: upper airway cough syndrome (UACS) which is caused by variety of rhinitis conditions; asthma; gastroesophageal reflux disease (GERD); chronic bronchitis from cigarette smoking or other inhaled environmental irritants; non-asthmatic eosinophilic bronchitis; and bronchiectasis.  . In prospective studies, these conditions have accounted for up to 94% of the causes of chronic cough in immunocompetent adults.  . Patient's history and physical examination suggest that her cough is responsive to oral prednisone suggesting there is an inflammatory component to her coughing.   Did not change her inhaler today as they just started [facility] recently however, if symptoms are not controlled recommend switching to a HFA inhaler with spacer or using nebulized medications.  . Daily controller medication(s): continue Wixela 212mcg 1 puff twice a day and rinse mouth afterwards . May use albuterol rescue inhaler 2 puffs or nebulizer every 4 to 6 hours as needed for shortness of breath, chest tightness, coughing, and wheezing. May use albuterol rescue inhaler 2 puffs 5 to 15 minutes prior to strenuous physical activities. Monitor frequency of use.   . Get Chest-Xray. . Get bloodwork as below to rule out other etiologies.  . Attempt repeating spirometry at next visit.

## 2019-06-11 NOTE — Patient Instructions (Addendum)
Coughing: Unable to perform breathing test.  Get Chest x-ray.   There are many causes of coughing and we are trying to see what's causing her coughing so she can be treated appropriately.  . Daily controller medication(s): continue Wixela 228mcg 1 puff twice a day and rinse mouth afterwards . May use albuterol rescue inhaler 2 puffs or nebulizer every 4 to 6 hours as needed for shortness of breath, chest tightness, coughing, and wheezing. May use albuterol rescue inhaler 2 puffs 5 to 15 minutes prior to strenuous physical activities. Monitor frequency of use.  . Coughing control goals:  o Full participation in all desired activities (may need albuterol before activity) o Albuterol use two times or less a week on average (not counting use with activity) o Cough interfering with sleep two times or less a month o Oral steroids no more than once a year o No hospitalizations  Get CXR . Get bloodwork:  o We are ordering labs, so please allow 1-2 weeks for the results to come back. o With the newly implemented Cures Act, the labs might be visible to you at the same time that they become visible to me. However, I will not address the results until all of the results are back, so please be patient.   Follow up in 2 months or sooner if needed.

## 2019-06-11 NOTE — Progress Notes (Signed)
New Patient Note  RE: Kelly Conrad MRN: [id number] DOB: [birth year] Date of Office Visit: 06/11/2019  Referring provider: Leeroy Conrad,* Primary care provider: Leeroy Cha, MD  Chief Complaint: Asthma  History of Present Illness: I had the pleasure of seeing Kelly Conrad for initial evaluation at the [facility] on 06/11/2019. She is a 86 y.o. female, who is referred here by Kelly Cha, MD for the evaluation of coughing.  She is accompanied today by her caretaker who provided/contributed to the history. Patient is a poor historian.  Coughing:  Coughing is reported as productive with clear/white sputum, reports occasional wheezing and dyspnea with exertion. Denies tightness in chest and fever. The cough started approximately a year and a half ago. Coughing occurs throughout the day and night. Reports trying over the counter medications for cough with no relief. Prednisone is the only medication that helps. Had 2 rounds of oral steroids and one injection over the past 6 months with symptoms resolution for a few weeks at a time. She finished her last prednisone taper 2-3 days ago and currently asymptomatic. Currently using Wixela 250/50 2 puffs once a day, albuterol 2.5 mg- 3 times in the past 2 weeks and Proair Respiclick as needed. Wixela was added to her regimen about a week ago.  Reports that she was diagnosed with asthma at the beginning of last year, but she also had asthma as a child.  She tried the following other inhalers: none. Main triggers are unknown. In the last month, frequency of symptoms: daily. Frequency of nocturnal symptoms: nightly. Frequency of SABA use: few times/week. Interference with physical activity: sometimes. Sleep is disturbed.   Lifetime history of hospitalization for respiratory issues: yes for fluid overload s/p IV fluid resuscitation for pneumonia/sepsis. Prior intubations: no. History of pneumonia: yes. She was  evaluated by pulmonologist in the past. Smoking exposure: no. Up to date with flu vaccine: yes. Up to date with pneumonia vaccine: yes.  History of reflux: yes but denies any current symptoms.   Reports occasional runny nose and denies any stuffy nose, sneezing, post nasal drainage, and itchy watery eyes.  Denies any problems with foods. Able to eat and tolerate milk, eggs, fish, shellfish, peanuts, tree nuts, wheat, and soy. Has not received any antibiotics in the past year.  Last CXR on 01/24/2017 IMPRESSION: No active cardiopulmonary disease.  Assessment and Plan: Kelly Conrad is a 86 y.o. female with: Chronic cough Patient is a poor historian and most of the history was obtained through her caregiver Kelly Conrad. Chronic cough with clear/whitish sputum for the last 1.5 years. No triggers noted. Only prednisone seems to resolve symptoms. Recently started on Wixela 250/50 for 1 week and using albuterol nebulizer as needed. No recent Chest-Xray. Patient was seen by pulmonology in the past and cardiology as well.  Patient could not perform proper spirometry due to poor effort/technique.   Patient/caregiver initially hesitant about care plan and recommended testing/labs and insisting that only prednisone works. Discussed the adverse effects associated with prolonged oral prednisone use.   Discussed at length that chronic cough can be caused by a multitude of disease processes including the following: upper airway cough syndrome (UACS) which is caused by variety of rhinitis conditions; asthma; gastroesophageal reflux disease (GERD); chronic bronchitis from cigarette smoking or other inhaled environmental irritants; non-asthmatic eosinophilic bronchitis; and bronchiectasis.  . In prospective studies, these conditions have accounted for up to 94% of the causes of chronic cough in immunocompetent adults.  . Patient's  history and physical examination suggest that her cough is responsive to oral prednisone  suggesting there is an inflammatory component to her coughing.   Did not change her inhaler today as they just started Kelly Conrad recently however, if symptoms are not controlled recommend switching to a HFA inhaler with spacer or using nebulized medications.  . Daily controller medication(s): continue Wixela 281mcg 1 puff twice a day and rinse mouth afterwards . May use albuterol rescue inhaler 2 puffs or nebulizer every 4 to 6 hours as needed for shortness of breath, chest tightness, coughing, and wheezing. May use albuterol rescue inhaler 2 puffs 5 to 15 minutes prior to strenuous physical activities. Monitor frequency of use.  . Get Chest-Xray. . Get bloodwork as below to rule out other etiologies.  . Attempt repeating spirometry at next visit.   Return in about 2 months (around 08/11/2019), or if symptoms worsen or fail to improve.  Lab Orders     CBC with Differential/Platelet     Allergens w/Total IgE Area 2     Comprehensive metabolic panel     B Nat Peptide  Other allergy screening: Rhino conjunctivitis: no Food allergy: no Medication allergy: no Hymenoptera allergy: no Urticaria: no Eczema:no History of recurrent infections suggestive of immunodeficency: no  Diagnostics: Spirometry:  Tracings reviewed. Her effort: Poor effort, data can not be interpreted.  Past Medical History: Patient Active Problem List   Diagnosis Date Noted  . Closed fracture of left hip requiring operative repair with nonunion 09/26/2015  . Avascular necrosis of bone of left hip ([place]) 09/26/2015  . Pertrochanteric fracture of left femur ([place]) 04/20/2015  . Intertrochanteric fracture of left femur ([place]) 04/19/2015  . Chronic asthma without complication 123XX123  . Hypothyroidism (acquired) 04/19/2015  . Fracture, intertrochanteric, left femur ([place]) 04/19/2015  . Closed left hip fracture ([place])   . Hx of bacterial pneumonia 11/11/2014  . Morbid obesity due to excess calories ([place]) complicated by  hyperlipidemia and hbp 09/24/2014  . Pneumonia due to Haemophilus influenzae with late R parapneumonic effusion/scarring  07/14/2014  . Acute respiratory failure ([place])   . Dyspnea on exertion   . CAP (community acquired pneumonia) 06/28/2014  . Sepsis ([place]) 06/28/2014  . Blood poisoning   . Visit for preventive health examination 11/24/2012  . Medicare annual wellness visit, subsequent 11/24/2012  . Tremor 05/22/2012  . Hearing aid worn 05/22/2012  . Benign hypertensive renal disease 05/22/2012  . History of positive PPD   . Renal insufficiency 04/29/2011  . Dementia ([place]) 04/23/2011  . Chronic cough 06/15/2009  . Osteoporosis 04/20/2009  . VITAMIN D DEFICIENCY 01/29/2008  . ASTHMA 01/29/2008  . HYPOTHYROIDISM NOS 01/07/2007  . ADVEF, DRUG/MED/BIOL SUBST, OTHER DRUG NOS 01/07/2007  . HYPERLIPIDEMIA 01/03/2007  . Essential hypertension 01/03/2007  . ALLERGIC RHINITIS 01/03/2007  . GERD 01/03/2007   Past Medical History:  Diagnosis Date  . Allergy   . Arthritis    hip, hands   . Asthma   . Dementia ([place])    pt. oriented to person , place & time. , very cooperative with interview   . GERD (gastroesophageal reflux disease)   . History of positive PPD    gets c xray screen  . Hx of varicella   . Hyperlipidemia   . Hypertension   . Pneumonia    06/2014- hosp.    Past Surgical History: Past Surgical History:  Procedure Laterality Date  . EYE SURGERY Bilateral    cataracts removed, followed by /w IOL  .  FEMUR IM NAIL Left 04/20/2015   Procedure: INTRAMEDULLARY (IM) NAIL FEMORAL;  Surgeon: Rod Can, MD;  Location: [facility];  Service: Orthopedics;  Laterality: Left;  . HARDWARE REMOVAL Left 09/26/2015   Procedure: HARDWARE REMOVAL LEFT FEMUR;  Surgeon: Rod Can, MD;  Location: [facility];  Service: Orthopedics;  Laterality: Left;  . TOTAL HIP ARTHROPLASTY Left 09/26/2015   Procedure: LEFT HIP TOTAL ARTHROPLASTY POSTERIOR ;  Surgeon: Rod Can, MD;  Location: [facility];   Service: Orthopedics;  Laterality: Left;   Medication List:  Current Outpatient Medications  Medication Sig Dispense Refill  . albuterol (PROVENTIL) (2.5 MG/3ML) 0.083% nebulizer solution SMARTSIG:1 Vial(s) Via Nebulizer 3 Times Daily PRN    . alendronate (FOSAMAX) 70 MG tablet Take 70 mg by mouth once a week.    Marland Kitchen aspirin 81 MG tablet Take 81 mg daily by mouth.    . Calcium Carbonate-Vitamin D (CALCIUM-VITAMIN D3 PO) Take 600 mg by mouth daily.    . Cyanocobalamin (VITAMIN B-12 PO) Take 1 tablet daily by mouth.    . diphenhydrAMINE (BENADRYL) 25 MG tablet Take 25 mg by mouth every 6 (six) hours as needed for allergies.    . hydrochlorothiazide (MICROZIDE) 12.5 MG capsule TAKE 1 CAPSULE (12.5 MG TOTAL) BY MOUTH DAILY. 90 capsule 1  . montelukast (SINGULAIR) 10 MG tablet Take 10 mg by mouth daily.    . Multiple Vitamins-Minerals (CENTRUM SILVER PO) Take 1 tablet by mouth daily.     Marland Kitchen neomycin-polymyxin b-dexamethasone (MAXITROL) 3.5-10000-0.1 OINT APPLY A SMALL AMOUNT ONTO SUTURES OR OPERATIVE SITE TWICE DAILY FOR 3 DAYS THEN STOP    . Omega-3 Fatty Acids (FISH OIL) 1000 MG CPDR Take 1 tablet by mouth daily.    . pantoprazole (PROTONIX) 40 MG tablet TAKE 1 TABLET(40 MG) BY MOUTH DAILY 30 TO 60 MINUTES BEFORE FIRST MEAL OF THE DAY 30 tablet 5  . PROAIR RESPICLICK 123XX123 (90 Base) MCG/ACT AEPB INHALE 1 PUFF BY MOUTH EVERY 12 HOURS AS NEEDED    . simvastatin (ZOCOR) 40 MG tablet TAKE 1 TABLET BY MOUTH AT BEDTIME 90 tablet 0  . vitamin C (ASCORBIC ACID) 500 MG tablet Take 500 mg by mouth daily.    Grant Ruts INHUB 250-50 MCG/DOSE AEPB 1 puff 2 (two) times daily.    . furosemide (LASIX) 20 MG tablet Take 1 tablet (20 mg total) by mouth as needed.     No current facility-administered medications for this visit.   Facility-Administered Medications Ordered in Other Visits  Medication Dose Route Frequency Provider Last Rate Last Admin  . chlorhexidine (HIBICLENS) 4 % liquid 4 application  60 mL Topical  Once Swinteck, Aaron Edelman, MD      . chlorhexidine (HIBICLENS) 4 % liquid 4 application  60 mL Topical Once Swinteck, Aaron Edelman, MD      . povidone-iodine 10 % swab 2 application  2 application Topical Once Rod Can, MD       Allergies: Allergies  Allergen Reactions  . No Known Allergies    Social History: Social History   Socioeconomic History  . Marital status: Single    Spouse name: Not on file  . Number of children: Not on file  . Years of education: Not on file  . Highest education level: Not on file  Occupational History  . Not on file  Tobacco Use  . Smoking status: Never Smoker  . Smokeless tobacco: Never Used  Substance and Sexual Activity  . Alcohol use: No  . Drug use: No  .  Sexual activity: Never  Other Topics Concern  . 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 [name]    He is the main caretaker. Kaydee took care of his mom  Who has since passed away.   He has some vision problems and partial amputation but  No mobiilty problems       Neg ets FA safely stored smoke alarm   No tob or etoh.   Social Determinants of Health   Financial Resource Strain:   . Difficulty of Paying Living Expenses:   Food Insecurity:   . Worried About Charity fundraiser in the Last Year:   . Arboriculturist in the Last Year:   Transportation Needs:   . Film/video editor (Medical):   Marland Kitchen Lack of Transportation (Non-Medical):   Physical Activity:   . Days of Exercise per Week:   . Minutes of Exercise per Session:   Stress:   . Feeling of Stress :   Social Connections:   . Frequency of Communication with Friends and Family:   . Frequency of Social Gatherings with Friends and Family:   . Attends Religious Services:   . Active Member of Clubs or Organizations:   . Attends Archivist Meetings:   Marland Kitchen Marital Status:    Lives in a 83 year old home. Smoking: denies Occupation: not  employed  Environmental HistoryFreight forwarder in the house: no Charity fundraiser in the family room: no Carpet in the bedroom: no Heating: electric Cooling: central Pet: no  Family History: Family History  Problem Relation Age of Onset  . Diabetes Mother   . Stroke Mother   . Diabetes Father   . Edema Father        legs  . Hypertension Other        fhx   Unsure of family history.   Review of Systems  Constitutional: Negative for appetite change, chills, fever and unexpected weight change.  HENT: Positive for rhinorrhea. Negative for congestion and sneezing.        Occasional rhinorrhea  Eyes: Negative for itching.  Respiratory: Positive for cough, shortness of breath and wheezing.   Cardiovascular: Negative for chest pain.  Gastrointestinal: Negative for abdominal pain.  Genitourinary: Negative for difficulty urinating.  Skin: Negative for rash.  Allergic/Immunologic: Negative for food allergies.  Neurological: Negative for headaches.   Objective: BP 116/70 (BP Location: Right Arm, Patient Position: Sitting, Cuff Size: Normal)   Pulse 78   Temp 97.8 F (36.6 C) (Temporal)   Resp 17   Ht 5' 1.5" (1.562 m)   Wt 172 lb (78 kg)   SpO2 93%   BMI 31.97 kg/m  Body mass index is 31.97 kg/m. Physical Exam  Constitutional: She appears well-developed and well-nourished.  HENT:  Head: Normocephalic and atraumatic.  Right Ear: External ear normal.  Left Ear: External ear normal.  Nose: Nose normal.  Mouth/Throat: Oropharynx is clear and moist.  Wears hearing aids, unable to visualize right ear tympanic membrane due to cerumen. Left ear tympanic membrane intact and  pearly gray  Eyes: Conjunctivae and EOM are normal.  Cardiovascular: Normal rate, regular rhythm and normal heart sounds. Exam reveals no gallop and no friction rub.  No murmur heard. Pulmonary/Chest: Effort normal. She has no wheezes. She has no rales.  Slightly decreased breath sounds on the right side.    Abdominal: Soft.  Musculoskeletal:     Cervical back: Neck supple.  Neurological: She is alert.  Poor historian  Skin: Skin is warm. No rash noted.  Psychiatric: She has a normal mood and affect. Her behavior is normal.  Nursing note and vitals reviewed.  The plan was reviewed with the patient/family, and all questions/concerned were addressed.  It was my pleasure to see Miski today and participate in her care. Please feel free to contact me with any questions or concerns.  Sincerely,  Rexene Alberts, DO Allergy & Immunology  [facility] office: [phone] [facility] office: [place] office: [phone]  90 minutes spent face-to-face with more than 50% of the time spent discussing coughing.

## 2019-06-13 LAB — CBC WITH DIFFERENTIAL/PLATELET
Basophils Absolute: 0.1 10*3/uL (ref 0.0–0.2)
Basos: 1 %
EOS (ABSOLUTE): 0.3 10*3/uL (ref 0.0–0.4)
Eos: 4 %
Hematocrit: 47.6 % — ABNORMAL HIGH (ref 34.0–46.6)
Hemoglobin: 15.9 g/dL (ref 11.1–15.9)
Immature Grans (Abs): 0 10*3/uL (ref 0.0–0.1)
Immature Granulocytes: 1 %
Lymphocytes Absolute: 1 10*3/uL (ref 0.7–3.1)
Lymphs: 12 %
MCH: 29.6 pg (ref 26.6–33.0)
MCHC: 33.4 g/dL (ref 31.5–35.7)
MCV: 89 fL (ref 79–97)
Monocytes Absolute: 0.7 10*3/uL (ref 0.1–0.9)
Monocytes: 9 %
Neutrophils Absolute: 6 10*3/uL (ref 1.4–7.0)
Neutrophils: 73 %
Platelets: 189 10*3/uL (ref 150–450)
RBC: 5.38 x10E6/uL — ABNORMAL HIGH (ref 3.77–5.28)
RDW: 14.1 % (ref 11.7–15.4)
WBC: 8.1 10*3/uL (ref 3.4–10.8)

## 2019-06-13 LAB — ALLERGENS W/TOTAL IGE AREA 2
Alternaria Alternata IgE: <0.1 kU/L
Aspergillus Fumigatus IgE: 0.16 kU/L — AB
Bermuda Grass IgE: <0.1 kU/L
Cat Dander IgE: <0.1 kU/L
Cedar, Mountain IgE: <0.1 kU/L
Cladosporium Herbarum IgE: <0.1 kU/L
Cockroach, German IgE: <0.1 kU/L
Common Silver Birch IgE: <0.1 kU/L
Cottonwood IgE: <0.1 kU/L
D Farinae IgE: <0.1 kU/L
D Pteronyssinus IgE: 0.12 kU/L — AB
Dog Dander IgE: <0.1 kU/L
Elm, American IgE: <0.1 kU/L
IgE (Immunoglobulin E), Serum: 521 IU/mL — ABNORMAL HIGH (ref 6–495)
Johnson Grass IgE: <0.1 kU/L
Maple/Box Elder IgE: <0.1 kU/L
Mouse Urine IgE: <0.1 kU/L
Oak, White IgE: <0.1 kU/L
Pecan, Hickory IgE: <0.1 kU/L
Penicillium Chrysogen IgE: 0.15 kU/L — AB
Pigweed, Rough IgE: <0.1 kU/L
Ragweed, Short IgE: <0.1 kU/L
Sheep Sorrel IgE Qn: <0.1 kU/L
Timothy Grass IgE: <0.1 kU/L
White Mulberry IgE: <0.1 kU/L

## 2019-06-13 LAB — COMPREHENSIVE METABOLIC PANEL
ALT: 20 IU/L (ref 0–32)
AST: 16 IU/L (ref 0–40)
Albumin/Globulin Ratio: 1.6 (ref 1.2–2.2)
Albumin: 4.1 g/dL (ref 3.6–4.6)
Alkaline Phosphatase: 73 IU/L (ref 39–117)
BUN/Creatinine Ratio: 22 (ref 12–28)
BUN: 38 mg/dL — ABNORMAL HIGH (ref 8–27)
Bilirubin Total: 0.6 mg/dL (ref 0.0–1.2)
CO2: 26 mmol/L (ref 20–29)
Calcium: 10 mg/dL (ref 8.7–10.3)
Chloride: 101 mmol/L (ref 96–106)
Creatinine, Ser: 1.7 mg/dL — ABNORMAL HIGH (ref 0.57–1.00)
GFR calc Af Amer: 31 mL/min/{1.73_m2} — ABNORMAL LOW (ref >59)
GFR calc non Af Amer: 27 mL/min/{1.73_m2} — ABNORMAL LOW (ref >59)
Globulin, Total: 2.6 g/dL (ref 1.5–4.5)
Glucose: 91 mg/dL (ref 65–99)
Potassium: 4.9 mmol/L (ref 3.5–5.2)
Sodium: 142 mmol/L (ref 134–144)
Total Protein: 6.7 g/dL (ref 6.0–8.5)

## 2019-06-13 LAB — BRAIN NATRIURETIC PEPTIDE: BNP: 66.3 pg/mL (ref 0.0–100.0)

## 2019-08-13 ENCOUNTER — Ambulatory Visit: Payer: Medicare Other | Admitting: Allergy

## 2019-11-03 ENCOUNTER — Other Ambulatory Visit: Payer: Self-pay | Admitting: Internal Medicine

## 2019-11-03 DIAGNOSIS — M810 Age-related osteoporosis without current pathological fracture: Secondary | ICD-10-CM

## 2020-02-09 ENCOUNTER — Other Ambulatory Visit: Payer: Self-pay

## 2020-02-09 ENCOUNTER — Ambulatory Visit
Admission: RE | Admit: 2020-02-09 | Discharge: 2020-02-09 | Disposition: A | Discharge status: Home / Self Care | Payer: Medicare Other | Source: Ambulatory Visit | Attending: Internal Medicine | Admitting: Internal Medicine

## 2020-02-09 DIAGNOSIS — M810 Age-related osteoporosis without current pathological fracture: Secondary | ICD-10-CM

## 2020-07-08 DIAGNOSIS — M810 Age-related osteoporosis without current pathological fracture: Secondary | ICD-10-CM | POA: Diagnosis not present

## 2020-07-08 DIAGNOSIS — J4540 Moderate persistent asthma, uncomplicated: Secondary | ICD-10-CM | POA: Diagnosis not present

## 2020-07-08 DIAGNOSIS — N183 Chronic kidney disease, stage 3 unspecified: Secondary | ICD-10-CM | POA: Diagnosis not present

## 2020-07-08 DIAGNOSIS — I10 Essential (primary) hypertension: Secondary | ICD-10-CM | POA: Diagnosis not present

## 2020-09-05 ENCOUNTER — Other Ambulatory Visit: Payer: Self-pay | Admitting: Internal Medicine

## 2020-09-05 ENCOUNTER — Other Ambulatory Visit: Payer: Self-pay

## 2020-09-05 ENCOUNTER — Ambulatory Visit
Admission: RE | Admit: 2020-09-05 | Discharge: 2020-09-05 | Disposition: A | Discharge status: Home / Self Care | Payer: Medicare Other | Source: Ambulatory Visit | Attending: Internal Medicine | Admitting: Internal Medicine

## 2020-09-05 DIAGNOSIS — I517 Cardiomegaly: Secondary | ICD-10-CM | POA: Diagnosis not present

## 2020-09-05 DIAGNOSIS — J4541 Moderate persistent asthma with (acute) exacerbation: Secondary | ICD-10-CM | POA: Diagnosis not present

## 2020-09-05 DIAGNOSIS — R0609 Other forms of dyspnea: Secondary | ICD-10-CM

## 2020-09-05 DIAGNOSIS — R0600 Dyspnea, unspecified: Secondary | ICD-10-CM | POA: Diagnosis not present

## 2020-12-27 DIAGNOSIS — Z1389 Encounter for screening for other disorder: Secondary | ICD-10-CM | POA: Diagnosis not present

## 2020-12-27 DIAGNOSIS — Z96642 Presence of left artificial hip joint: Secondary | ICD-10-CM | POA: Diagnosis not present

## 2020-12-27 DIAGNOSIS — I10 Essential (primary) hypertension: Secondary | ICD-10-CM | POA: Diagnosis not present

## 2020-12-27 DIAGNOSIS — E785 Hyperlipidemia, unspecified: Secondary | ICD-10-CM | POA: Diagnosis not present

## 2020-12-27 DIAGNOSIS — M810 Age-related osteoporosis without current pathological fracture: Secondary | ICD-10-CM | POA: Diagnosis not present

## 2020-12-27 DIAGNOSIS — Z0000 Encounter for general adult medical examination without abnormal findings: Secondary | ICD-10-CM | POA: Diagnosis not present

## 2020-12-27 DIAGNOSIS — Z23 Encounter for immunization: Secondary | ICD-10-CM | POA: Diagnosis not present

## 2020-12-27 DIAGNOSIS — E7849 Other hyperlipidemia: Secondary | ICD-10-CM | POA: Diagnosis not present

## 2021-01-23 DIAGNOSIS — N183 Chronic kidney disease, stage 3 unspecified: Secondary | ICD-10-CM | POA: Diagnosis not present

## 2021-01-23 DIAGNOSIS — J4541 Moderate persistent asthma with (acute) exacerbation: Secondary | ICD-10-CM | POA: Diagnosis not present

## 2021-01-23 DIAGNOSIS — R0902 Hypoxemia: Secondary | ICD-10-CM | POA: Diagnosis not present

## 2021-01-23 DIAGNOSIS — J069 Acute upper respiratory infection, unspecified: Secondary | ICD-10-CM | POA: Diagnosis not present

## 2021-02-14 DIAGNOSIS — Z789 Other specified health status: Secondary | ICD-10-CM | POA: Diagnosis not present

## 2021-02-14 DIAGNOSIS — L89319 Pressure ulcer of right buttock, unspecified stage: Secondary | ICD-10-CM | POA: Diagnosis not present

## 2021-02-14 DIAGNOSIS — L538 Other specified erythematous conditions: Secondary | ICD-10-CM | POA: Diagnosis not present

## 2021-02-14 DIAGNOSIS — L820 Inflamed seborrheic keratosis: Secondary | ICD-10-CM | POA: Diagnosis not present

## 2021-02-14 DIAGNOSIS — L918 Other hypertrophic disorders of the skin: Secondary | ICD-10-CM | POA: Diagnosis not present

## 2021-02-14 DIAGNOSIS — Z713 Dietary counseling and surveillance: Secondary | ICD-10-CM | POA: Diagnosis not present

## 2021-02-14 DIAGNOSIS — L89329 Pressure ulcer of left buttock, unspecified stage: Secondary | ICD-10-CM | POA: Diagnosis not present

## 2021-02-20 DIAGNOSIS — N1832 Chronic kidney disease, stage 3b: Secondary | ICD-10-CM | POA: Diagnosis not present

## 2021-02-20 DIAGNOSIS — I129 Hypertensive chronic kidney disease with stage 1 through stage 4 chronic kidney disease, or unspecified chronic kidney disease: Secondary | ICD-10-CM | POA: Diagnosis not present

## 2021-09-25 DIAGNOSIS — H905 Unspecified sensorineural hearing loss: Secondary | ICD-10-CM | POA: Diagnosis not present

## 2021-11-09 ENCOUNTER — Ambulatory Visit: Payer: Self-pay

## 2021-11-09 NOTE — Patient Outreach (Signed)
  Care Coordination   11/09/2021 Name: Kelly Conrad MRN: [id number] DOB: April 15, 1931   Care Coordination Outreach Attempts:  An unsuccessful telephone outreach was attempted today to offer the patient information about available care coordination services as a benefit of their health plan.   Follow Up Plan:  Additional outreach attempts will be made to offer the patient care coordination information and services.   Encounter Outcome:  No Answer  Care Coordination Interventions Activated:  No   Care Coordination Interventions:  No, not indicated    [facility] [facility] [phone]

## 2022-01-16 DIAGNOSIS — I517 Cardiomegaly: Secondary | ICD-10-CM | POA: Diagnosis not present

## 2022-01-16 DIAGNOSIS — Z0000 Encounter for general adult medical examination without abnormal findings: Secondary | ICD-10-CM | POA: Diagnosis not present

## 2022-01-16 DIAGNOSIS — E7849 Other hyperlipidemia: Secondary | ICD-10-CM | POA: Diagnosis not present

## 2022-01-16 DIAGNOSIS — Z96642 Presence of left artificial hip joint: Secondary | ICD-10-CM | POA: Diagnosis not present

## 2022-01-16 DIAGNOSIS — R739 Hyperglycemia, unspecified: Secondary | ICD-10-CM | POA: Diagnosis not present

## 2022-01-16 DIAGNOSIS — I5189 Other ill-defined heart diseases: Secondary | ICD-10-CM | POA: Diagnosis not present

## 2022-01-16 DIAGNOSIS — J4540 Moderate persistent asthma, uncomplicated: Secondary | ICD-10-CM | POA: Diagnosis not present

## 2022-01-16 DIAGNOSIS — N1832 Chronic kidney disease, stage 3b: Secondary | ICD-10-CM | POA: Diagnosis not present

## 2022-01-16 DIAGNOSIS — I10 Essential (primary) hypertension: Secondary | ICD-10-CM | POA: Diagnosis not present

## 2022-01-16 DIAGNOSIS — M810 Age-related osteoporosis without current pathological fracture: Secondary | ICD-10-CM | POA: Diagnosis not present

## 2022-01-17 ENCOUNTER — Other Ambulatory Visit: Payer: Self-pay | Admitting: Internal Medicine

## 2022-01-17 ENCOUNTER — Encounter: Payer: Self-pay | Admitting: Internal Medicine

## 2022-01-17 DIAGNOSIS — M810 Age-related osteoporosis without current pathological fracture: Secondary | ICD-10-CM

## 2022-03-15 DIAGNOSIS — R208 Other disturbances of skin sensation: Secondary | ICD-10-CM | POA: Diagnosis not present

## 2022-03-15 DIAGNOSIS — L298 Other pruritus: Secondary | ICD-10-CM | POA: Diagnosis not present

## 2022-03-15 DIAGNOSIS — L820 Inflamed seborrheic keratosis: Secondary | ICD-10-CM | POA: Diagnosis not present

## 2022-03-15 DIAGNOSIS — L538 Other specified erythematous conditions: Secondary | ICD-10-CM | POA: Diagnosis not present

## 2022-03-15 DIAGNOSIS — L570 Actinic keratosis: Secondary | ICD-10-CM | POA: Diagnosis not present

## 2022-03-15 DIAGNOSIS — Z789 Other specified health status: Secondary | ICD-10-CM | POA: Diagnosis not present

## 2022-05-11 DIAGNOSIS — N1832 Chronic kidney disease, stage 3b: Secondary | ICD-10-CM | POA: Diagnosis not present

## 2022-05-11 DIAGNOSIS — I129 Hypertensive chronic kidney disease with stage 1 through stage 4 chronic kidney disease, or unspecified chronic kidney disease: Secondary | ICD-10-CM | POA: Diagnosis not present

## 2022-05-28 DIAGNOSIS — J45901 Unspecified asthma with (acute) exacerbation: Secondary | ICD-10-CM | POA: Diagnosis not present

## 2022-05-28 DIAGNOSIS — N1832 Chronic kidney disease, stage 3b: Secondary | ICD-10-CM | POA: Diagnosis not present

## 2022-05-28 DIAGNOSIS — I10 Essential (primary) hypertension: Secondary | ICD-10-CM | POA: Diagnosis not present

## 2022-06-27 DIAGNOSIS — H6123 Impacted cerumen, bilateral: Secondary | ICD-10-CM | POA: Diagnosis not present

## 2022-07-12 ENCOUNTER — Ambulatory Visit
Admission: RE | Admit: 2022-07-12 | Discharge: 2022-07-12 | Disposition: A | Discharge status: Home / Self Care | Payer: Medicare Other | Source: Ambulatory Visit | Attending: Internal Medicine | Admitting: Internal Medicine

## 2022-07-12 DIAGNOSIS — M810 Age-related osteoporosis without current pathological fracture: Secondary | ICD-10-CM | POA: Diagnosis not present

## 2022-07-12 DIAGNOSIS — Z13820 Encounter for screening for osteoporosis: Secondary | ICD-10-CM | POA: Diagnosis not present

## 2022-07-24 DIAGNOSIS — J45901 Unspecified asthma with (acute) exacerbation: Secondary | ICD-10-CM | POA: Diagnosis not present

## 2022-07-24 DIAGNOSIS — M810 Age-related osteoporosis without current pathological fracture: Secondary | ICD-10-CM | POA: Diagnosis not present

## 2022-07-24 DIAGNOSIS — I10 Essential (primary) hypertension: Secondary | ICD-10-CM | POA: Diagnosis not present

## 2022-07-24 DIAGNOSIS — N1832 Chronic kidney disease, stage 3b: Secondary | ICD-10-CM | POA: Diagnosis not present

## 2022-11-18 ENCOUNTER — Emergency Department (HOSPITAL_COMMUNITY): Payer: Medicare Other

## 2022-11-18 ENCOUNTER — Encounter (HOSPITAL_COMMUNITY): Payer: Self-pay

## 2022-11-18 ENCOUNTER — Other Ambulatory Visit: Payer: Self-pay

## 2022-11-18 ENCOUNTER — Emergency Department (HOSPITAL_COMMUNITY)
Admission: EM | Admit: 2022-11-18 | Discharge: 2022-11-20 | Disposition: A | Discharge status: Skilled Nursing Facility | Payer: Medicare Other | Attending: Student | Admitting: Student

## 2022-11-18 DIAGNOSIS — W010XXA Fall on same level from slipping, tripping and stumbling without subsequent striking against object, initial encounter: Secondary | ICD-10-CM | POA: Diagnosis not present

## 2022-11-18 DIAGNOSIS — S199XXA Unspecified injury of neck, initial encounter: Secondary | ICD-10-CM | POA: Diagnosis not present

## 2022-11-18 DIAGNOSIS — Z7982 Long term (current) use of aspirin: Secondary | ICD-10-CM | POA: Insufficient documentation

## 2022-11-18 DIAGNOSIS — R062 Wheezing: Secondary | ICD-10-CM | POA: Diagnosis not present

## 2022-11-18 DIAGNOSIS — I10 Essential (primary) hypertension: Secondary | ICD-10-CM | POA: Diagnosis not present

## 2022-11-18 DIAGNOSIS — R0602 Shortness of breath: Secondary | ICD-10-CM | POA: Diagnosis not present

## 2022-11-18 DIAGNOSIS — R944 Abnormal results of kidney function studies: Secondary | ICD-10-CM | POA: Diagnosis not present

## 2022-11-18 DIAGNOSIS — R6889 Other general symptoms and signs: Secondary | ICD-10-CM | POA: Diagnosis not present

## 2022-11-18 DIAGNOSIS — I499 Cardiac arrhythmia, unspecified: Secondary | ICD-10-CM | POA: Diagnosis not present

## 2022-11-18 DIAGNOSIS — R7309 Other abnormal glucose: Secondary | ICD-10-CM | POA: Insufficient documentation

## 2022-11-18 DIAGNOSIS — M25552 Pain in left hip: Secondary | ICD-10-CM | POA: Diagnosis not present

## 2022-11-18 DIAGNOSIS — R404 Transient alteration of awareness: Secondary | ICD-10-CM | POA: Diagnosis not present

## 2022-11-18 DIAGNOSIS — F0390 Unspecified dementia without behavioral disturbance: Secondary | ICD-10-CM | POA: Insufficient documentation

## 2022-11-18 DIAGNOSIS — Z79899 Other long term (current) drug therapy: Secondary | ICD-10-CM | POA: Diagnosis not present

## 2022-11-18 DIAGNOSIS — R627 Adult failure to thrive: Secondary | ICD-10-CM | POA: Diagnosis not present

## 2022-11-18 DIAGNOSIS — R9089 Other abnormal findings on diagnostic imaging of central nervous system: Secondary | ICD-10-CM | POA: Diagnosis not present

## 2022-11-18 DIAGNOSIS — S0990XA Unspecified injury of head, initial encounter: Secondary | ICD-10-CM | POA: Diagnosis not present

## 2022-11-18 DIAGNOSIS — I517 Cardiomegaly: Secondary | ICD-10-CM | POA: Diagnosis not present

## 2022-11-18 DIAGNOSIS — W19XXXA Unspecified fall, initial encounter: Secondary | ICD-10-CM

## 2022-11-18 DIAGNOSIS — R531 Weakness: Secondary | ICD-10-CM | POA: Insufficient documentation

## 2022-11-18 DIAGNOSIS — I4510 Unspecified right bundle-branch block: Secondary | ICD-10-CM | POA: Diagnosis not present

## 2022-11-18 DIAGNOSIS — I6523 Occlusion and stenosis of bilateral carotid arteries: Secondary | ICD-10-CM | POA: Diagnosis not present

## 2022-11-18 DIAGNOSIS — M25551 Pain in right hip: Secondary | ICD-10-CM | POA: Diagnosis not present

## 2022-11-18 DIAGNOSIS — Z743 Need for continuous supervision: Secondary | ICD-10-CM | POA: Diagnosis not present

## 2022-11-18 LAB — COMPREHENSIVE METABOLIC PANEL
ALT: 16 U/L (ref 0–44)
AST: 19 U/L (ref 15–41)
Albumin: 3.2 g/dL — ABNORMAL LOW (ref 3.5–5.0)
Alkaline Phosphatase: 74 U/L (ref 38–126)
Anion gap: 8 (ref 5–15)
BUN: 35 mg/dL — ABNORMAL HIGH (ref 8–23)
CO2: 27 mmol/L (ref 22–32)
Calcium: 8.6 mg/dL — ABNORMAL LOW (ref 8.9–10.3)
Chloride: 104 mmol/L (ref 98–111)
Creatinine, Ser: 1.44 mg/dL — ABNORMAL HIGH (ref 0.44–1.00)
GFR, Estimated: 35 mL/min — ABNORMAL LOW (ref >60)
Glucose, Bld: 169 mg/dL — ABNORMAL HIGH (ref 70–99)
Potassium: 3.7 mmol/L (ref 3.5–5.1)
Sodium: 139 mmol/L (ref 135–145)
Total Bilirubin: 0.5 mg/dL (ref 0.3–1.2)
Total Protein: 6 g/dL — ABNORMAL LOW (ref 6.5–8.1)

## 2022-11-18 LAB — CBC WITH DIFFERENTIAL/PLATELET
Abs Immature Granulocytes: 0.01 10*3/uL (ref 0.00–0.07)
Basophils Absolute: 0 10*3/uL (ref 0.0–0.1)
Basophils Relative: 1 %
Eosinophils Absolute: 0.7 10*3/uL — ABNORMAL HIGH (ref 0.0–0.5)
Eosinophils Relative: 9 %
HCT: 40 % (ref 36.0–46.0)
Hemoglobin: 13.2 g/dL (ref 12.0–15.0)
Immature Granulocytes: 0 %
Lymphocytes Relative: 21 %
Lymphs Abs: 1.6 10*3/uL (ref 0.7–4.0)
MCH: 29.8 pg (ref 26.0–34.0)
MCHC: 33 g/dL (ref 30.0–36.0)
MCV: 90.3 fL (ref 80.0–100.0)
Monocytes Absolute: 0.5 10*3/uL (ref 0.1–1.0)
Monocytes Relative: 7 %
Neutro Abs: 4.8 10*3/uL (ref 1.7–7.7)
Neutrophils Relative %: 62 %
Platelets: 173 10*3/uL (ref 150–400)
RBC: 4.43 MIL/uL (ref 3.87–5.11)
RDW: 14.2 % (ref 11.5–15.5)
WBC: 7.7 10*3/uL (ref 4.0–10.5)
nRBC: 0 % (ref 0.0–0.2)

## 2022-11-18 LAB — URINALYSIS, ROUTINE W REFLEX MICROSCOPIC
Bilirubin Urine: NEGATIVE
Glucose, UA: NEGATIVE mg/dL
Hgb urine dipstick: NEGATIVE
Ketones, ur: NEGATIVE mg/dL
Leukocytes,Ua: NEGATIVE
Nitrite: NEGATIVE
Protein, ur: NEGATIVE mg/dL
Specific Gravity, Urine: 1.017 (ref 1.005–1.030)
pH: 5 (ref 5.0–8.0)

## 2022-11-18 MED ORDER — ASPIRIN 81 MG PO CHEW
81.0000 mg | CHEWABLE_TABLET | Freq: Every day | ORAL | Status: DC
  Administered 2022-11-18 – 2022-11-20 (×3): 81 mg via ORAL
  Filled 2022-11-18 (×3): qty 1

## 2022-11-18 MED ORDER — MONTELUKAST SODIUM 10 MG PO TABS: 10.0000 mg | ORAL_TABLET | Freq: Every day | ORAL | Status: DC

## 2022-11-18 MED ORDER — ADULT MULTIVITAMIN W/MINERALS CH
1.0000 | ORAL_TABLET | Freq: Every day | ORAL | Status: DC
  Administered 2022-11-18 – 2022-11-20 (×3): 1 via ORAL
  Filled 2022-11-18 (×3): qty 1

## 2022-11-18 MED ORDER — SIMVASTATIN 10 MG PO TABS
40.0000 mg | ORAL_TABLET | Freq: Every day | ORAL | Status: DC
  Administered 2022-11-19 – 2022-11-20 (×2): 40 mg via ORAL
  Filled 2022-11-18 (×2): qty 4

## 2022-11-18 MED ORDER — CENTRUM SILVER PO TABS: ORAL_TABLET | Freq: Every day | ORAL | Status: DC

## 2022-11-18 MED ORDER — MONTELUKAST SODIUM 10 MG PO TABS
10.0000 mg | ORAL_TABLET | Freq: Every day | ORAL | Status: DC
  Administered 2022-11-19 – 2022-11-20 (×2): 10 mg via ORAL
  Filled 2022-11-18 (×2): qty 1

## 2022-11-18 MED ORDER — ALBUTEROL SULFATE (2.5 MG/3ML) 0.083% IN NEBU: 2.5000 mg | INHALATION_SOLUTION | Freq: Three times a day (TID) | RESPIRATORY_TRACT | Status: DC | PRN

## 2022-11-18 MED ORDER — HYDROCHLOROTHIAZIDE 12.5 MG PO TABS
12.5000 mg | ORAL_TABLET | Freq: Every day | ORAL | Status: DC
  Administered 2022-11-19 – 2022-11-20 (×2): 12.5 mg via ORAL
  Filled 2022-11-18 (×4): qty 1

## 2022-11-18 MED ORDER — PANTOPRAZOLE SODIUM 40 MG PO TBEC
40.0000 mg | DELAYED_RELEASE_TABLET | Freq: Every day | ORAL | Status: DC
  Administered 2022-11-18 – 2022-11-20 (×3): 40 mg via ORAL
  Filled 2022-11-18 (×3): qty 1

## 2022-11-18 MED ORDER — ALENDRONATE SODIUM 70 MG PO TABS: 70.0000 mg | ORAL_TABLET | ORAL | Status: DC

## 2022-11-18 MED ORDER — IPRATROPIUM-ALBUTEROL 0.5-2.5 (3) MG/3ML IN SOLN
3.0000 mL | Freq: Once | RESPIRATORY_TRACT | Status: AC
  Administered 2022-11-18: 3 mL via RESPIRATORY_TRACT
  Filled 2022-11-18: qty 3

## 2022-11-18 MED ORDER — MOMETASONE FURO-FORMOTEROL FUM 200-5 MCG/ACT IN AERO
2.0000 | INHALATION_SPRAY | Freq: Two times a day (BID) | RESPIRATORY_TRACT | Status: DC
  Administered 2022-11-18 – 2022-11-20 (×4): 2 via RESPIRATORY_TRACT
  Filled 2022-11-18: qty 8.8

## 2022-11-18 NOTE — ED Triage Notes (Signed)
Pt brought in by [facility] for AMS. Pt lives at home by herself with no caregiver and family (2 brothers) that live out of state. Pt has hx of dementia per EMS pt is A & O times 2 and her house conditions are poor. APS is involved with the case according to EMS. Pt denies any pain, per EMS pt had SOB with some inspiratory wheezes and administered albuterol in route.

## 2022-11-18 NOTE — ED Notes (Signed)
Spoke with social work and pt will need a PT evaluation before FL2 can be finished for bed placement.

## 2022-11-18 NOTE — ED Provider Notes (Signed)
[place] EMERGENCY DEPARTMENT AT [facility] Provider Note   CSN: [id number] Arrival date & time: 11/18/22  1343     History  Chief Complaint  Patient presents with   Altered Mental Status    Kelly Conrad is a 89 y.o. female with a history including hypertension, GERD, dementia, hyperlipidemia and arthritis, also asthma presenting for evaluation of a fall which occurred prior to arrival.  She was walking towards her neighbors home when she tripped and fell, landing on her buttocks per patient report.  The fall was unwitnessed.  Apparently her neighbor saw her sitting outside in assisted her and called EMS.  She is pleasantly demented did, cooperative, alert and oriented x 1, is unable to name place or time.  It is unclear to me if this is her baseline.  However it is concerning that patient currently lives alone.  She did have a caregiver and whose home she lived and still lives, however he unfortunately died in 09-21-22 and she has been living there alone since.  She is vague when I asked her how she takes care of herself, grocery shops, gets her meals.  She does state that she eats well and ate a chicken sandwich and chocolate cake today before arrival.  When I asked her about 5 minutes later what she ate today she was unable to answer the question.  EMS has concerns about her current living situation.  She lives in a home which is falling down, there are large holes on the floor.  They doubt her ability to take care of herself independently.  Further into patient's visit a friend Governor Rooks arrived and was able to give additional information.  He has been providing her with a daily meal and checking on her daily at that time.  He states that when his friend, her caregiver died he had reached out to [facility] DSS to inquire about any assistance patient could have in her home.  Initially they were exploring possibilities and when it was determined that she has brothers in [place] who  are actively working with her, attempting to get her moved to [place] they closed her case here in [place].  Ree Kida is unsure when they will be planning to move her.  He states that they are currently working on legal paperwork to be able to take care of her financial issues first.  Ree Kida states that they were here last week and to his knowledge they plan to come back in the next weekend as well.  Concurs that she is at her current normal mental status.  The history is provided by the patient and the EMS personnel.       Home Medications Prior to Admission medications   Medication Sig Start Date End Date Taking? Authorizing Provider  albuterol (PROVENTIL) (2.5 MG/3ML) 0.083% nebulizer solution SMARTSIG:1 Vial(s) Via Nebulizer 3 Times Daily PRN 03/26/19   [provider]  alendronate (FOSAMAX) 70 MG tablet Take 70 mg by mouth once a week. 05/17/19   [provider]  aspirin 81 MG tablet Take 81 mg daily by mouth.    [provider]  Calcium Carbonate-Vitamin D (CALCIUM-VITAMIN D3 PO) Take 600 mg by mouth daily.    [provider]  Cyanocobalamin (VITAMIN B-12 PO) Take 1 tablet daily by mouth.    [provider]  diphenhydrAMINE (BENADRYL) 25 MG tablet Take 25 mg by mouth every 6 (six) hours as needed for allergies.    [provider]  furosemide (LASIX) 20 MG tablet Take 1 tablet (20 mg total) by mouth as needed. 07/02/17 09/30/17  Bhagat, Sharrell Ku, PA  hydrochlorothiazide (MICROZIDE) 12.5 MG capsule TAKE 1 CAPSULE (12.5 MG TOTAL) BY MOUTH DAILY. 12/21/15   Panosh, Neta Mends, MD  montelukast (SINGULAIR) 10 MG tablet Take 10 mg by mouth daily. 06/02/19   [provider]  Multiple Vitamins-Minerals (CENTRUM SILVER PO) Take 1 tablet by mouth daily.     [provider]  neomycin-polymyxin b-dexamethasone (MAXITROL) 3.5-10000-0.1 OINT APPLY A SMALL AMOUNT ONTO SUTURES OR OPERATIVE SITE TWICE DAILY FOR 3 DAYS THEN STOP 04/14/19    [provider]  Omega-3 Fatty Acids (FISH OIL) 1000 MG CPDR Take 1 tablet by mouth daily.    [provider]  pantoprazole (PROTONIX) 40 MG tablet TAKE 1 TABLET(40 MG) BY MOUTH DAILY 30 TO 60 MINUTES BEFORE FIRST MEAL OF THE DAY 10/28/17   Nyoka Cowden, MD  predniSONE (DELTASONE) 10 MG tablet Take 10 mg by mouth See admin instructions.    [provider]  PROAIR RESPICLICK 108 (90 Base) MCG/ACT AEPB INHALE 1 PUFF BY MOUTH EVERY 12 HOURS AS NEEDED 05/01/19   [provider]  simvastatin (ZOCOR) 40 MG tablet TAKE 1 TABLET BY MOUTH AT BEDTIME 01/18/16   Panosh, Neta Mends, MD  vitamin C (ASCORBIC ACID) 500 MG tablet Take 500 mg by mouth daily.    [provider]  Monte Fantasia INHUB 250-50 MCG/DOSE AEPB 1 puff 2 (two) times daily. 06/02/19   [provider]      Allergies    Patient has no known allergies.    Review of Systems   Review of Systems  Unable to perform ROS: Dementia    Physical Exam Updated Vital Signs BP (!) 119/44   Pulse 89   Temp 97.7 F (36.5 C)   Resp 17   Ht 5\' 1"  (1.549 m)   Wt 78 kg   SpO2 97%   BMI 32.49 kg/m  Physical Exam Vitals and nursing note reviewed.  Constitutional:      Appearance: She is well-developed.  HENT:     Head: Normocephalic and atraumatic.  Eyes:     Conjunctiva/sclera: Conjunctivae normal.  Cardiovascular:     Rate and Rhythm: Normal rate and regular rhythm.     Heart sounds: Normal heart sounds.  Pulmonary:     Effort: Pulmonary effort is normal.     Breath sounds: Normal breath sounds. No wheezing.  Abdominal:     General: Bowel sounds are normal.     Palpations: Abdomen is soft.     Tenderness: There is no abdominal tenderness.  Musculoskeletal:        General: No deformity. Normal range of motion.     Cervical back: Normal range of motion.     Comments: Moves all extremities without complaint of pain.  Nontender to palpation along C-spine T-spine and L-spine.  Skin:     General: Skin is warm and dry.  Neurological:     Mental Status: She is alert.     Cranial Nerves: No cranial nerve deficit.     Sensory: Sensation is intact.     Motor: Weakness present.     Comments: Oriented x 1, does not know where she is nor does she know the date, day or month.  She can name the year.  Generalized weakness but nonfocal exam.  Psychiatric:        Behavior: Behavior is cooperative.     ED  Results / Procedures / Treatments   Labs (all labs ordered are listed, but only abnormal results are displayed) Labs Reviewed  CBC WITH DIFFERENTIAL/PLATELET - Abnormal; Notable for the following components:      Result Value   Eosinophils Absolute 0.7 (*)    All other components within normal limits  COMPREHENSIVE METABOLIC PANEL - Abnormal; Notable for the following components:   Glucose, Bld 169 (*)    BUN 35 (*)    Creatinine, Ser 1.44 (*)    Calcium 8.6 (*)    Total Protein 6.0 (*)    Albumin 3.2 (*)    GFR, Estimated 35 (*)    All other components within normal limits  URINALYSIS, ROUTINE W REFLEX MICROSCOPIC    EKG None  Radiology CT Head Wo Contrast  Result Date: 11/18/2022 CLINICAL DATA:  Head trauma, minor (Age >= 65y); Neck trauma (Age >= 65y). Dementia EXAM: CT HEAD WITHOUT CONTRAST CT CERVICAL SPINE WITHOUT CONTRAST TECHNIQUE: Multidetector CT imaging of the head and cervical spine was performed following the standard protocol without intravenous contrast. Multiplanar CT image reconstructions of the cervical spine were also generated. RADIATION DOSE REDUCTION: This exam was performed according to the departmental dose-optimization program which includes automated exposure control, adjustment of the mA and/or kV according to patient size and/or use of iterative reconstruction technique. COMPARISON:  None Available. FINDINGS: CT HEAD FINDINGS Brain: No evidence of acute infarction, hemorrhage, hydrocephalus, extra-axial collection or mass lesion/mass effect.  Scattered low-density changes within the periventricular and subcortical white matter most compatible with chronic microvascular ischemic change. Mild diffuse cerebral volume loss. Vascular: Atherosclerotic calcifications involving the large vessels of the skull base. No unexpected hyperdense vessel. Skull: Normal. Negative for fracture or focal lesion. Sinuses/Orbits: No acute finding. Other: None. CT CERVICAL SPINE FINDINGS Alignment: Facet joints are aligned without dislocation or traumatic listhesis. Dens and lateral masses are aligned. Grade 1 anterolisthesis of C7 on T1. Skull base and vertebrae: No acute fracture. No primary bone lesion or focal pathologic process. Soft tissues and spinal canal: No prevertebral fluid or swelling. No visible canal hematoma. Disc levels:  Advanced multilevel cervical spondylosis. Upper chest: Lung apices are clear. Other: Bilateral carotid atherosclerosis. IMPRESSION: 1. No acute intracranial abnormality. 2. No acute fracture or subluxation of the cervical spine. Electronically Signed   By: Duanne Guess D.O.   On: 11/18/2022 15:21   CT Cervical Spine Wo Contrast  Result Date: 11/18/2022 CLINICAL DATA:  Head trauma, minor (Age >= 65y); Neck trauma (Age >= 65y). Dementia EXAM: CT HEAD WITHOUT CONTRAST CT CERVICAL SPINE WITHOUT CONTRAST TECHNIQUE: Multidetector CT imaging of the head and cervical spine was performed following the standard protocol without intravenous contrast. Multiplanar CT image reconstructions of the cervical spine were also generated. RADIATION DOSE REDUCTION: This exam was performed according to the departmental dose-optimization program which includes automated exposure control, adjustment of the mA and/or kV according to patient size and/or use of iterative reconstruction technique. COMPARISON:  None Available. FINDINGS: CT HEAD FINDINGS Brain: No evidence of acute infarction, hemorrhage, hydrocephalus, extra-axial collection or mass lesion/mass  effect. Scattered low-density changes within the periventricular and subcortical white matter most compatible with chronic microvascular ischemic change. Mild diffuse cerebral volume loss. Vascular: Atherosclerotic calcifications involving the large vessels of the skull base. No unexpected hyperdense vessel. Skull: Normal. Negative for fracture or focal lesion. Sinuses/Orbits: No acute finding. Other: None. CT CERVICAL SPINE FINDINGS Alignment: Facet joints are aligned without dislocation or traumatic listhesis. Dens and lateral masses are aligned.  Grade 1 anterolisthesis of C7 on T1. Skull base and vertebrae: No acute fracture. No primary bone lesion or focal pathologic process. Soft tissues and spinal canal: No prevertebral fluid or swelling. No visible canal hematoma. Disc levels:  Advanced multilevel cervical spondylosis. Upper chest: Lung apices are clear. Other: Bilateral carotid atherosclerosis. IMPRESSION: 1. No acute intracranial abnormality. 2. No acute fracture or subluxation of the cervical spine. Electronically Signed   By: Duanne Guess D.O.   On: 11/18/2022 15:21   DG Hips Bilat W or Wo Pelvis 3-4 Views  Result Date: 11/18/2022 CLINICAL DATA:  Fall, pain EXAM: DG HIP (WITH OR WITHOUT PELVIS) 3-4V BILAT COMPARISON:  04/20/2015 FINDINGS: Postsurgical changes from left total hip arthroplasty with long stem femoral component. Chronic healed intertrochanteric fracture of the left femur. No evidence of an acute fracture. No dislocation. IMPRESSION: 1. No acute fracture or dislocation of either hip. 2. Chronic healed intertrochanteric fracture of the left femur. Electronically Signed   By: Duanne Guess D.O.   On: 11/18/2022 15:17   DG Chest 2 View  Result Date: 11/18/2022 CLINICAL DATA:  Shortness of breath.  Wheezing. EXAM: CHEST - 2 VIEW COMPARISON:  09/05/2020 FINDINGS: Stable mild cardiomegaly.  Both lungs are clear. IMPRESSION: Mild cardiomegaly. No active lung disease. Electronically  Signed   By: Danae Orleans M.D.   On: 11/18/2022 15:02    Procedures Procedures    Medications Ordered in ED Medications  ipratropium-albuterol (DUONEB) 0.5-2.5 (3) MG/3ML nebulizer solution 3 mL (3 mLs Nebulization Given 11/18/22 1513)    ED Course/ Medical Decision Making/ A&P                                 Medical Decision Making Patient presenting with a fall, without obvious injury, CT imaging and plain film imaging completed without injury found.  She has a history of dementia who is currently living alone in a dilapidated home since her long-term caregiver died about 2 months ago.  She does have a friend who checks on her daily but for the remainder of the day she is essentially alone.  EMS describes large holes in her flooring near her bathroom, poor environment.  There is concern about her ability to stay independently.  Recommend [place] DSS was involved at some point, but case was closed per patient's friend since her brothers in [place] are actively trying to move her to [place] but they do not know when this will happen.  I am concerned about her immediate safety needs, I do not feel comfortable with her returning home by herself today.  There is no obvious indication for admission but she would benefit from possible rehab/SNF placement.  I have ordered a PT eval.  Of also ordered TOC consult, the FL 2 has been started, but will be pending PT eval in the morning to proceed with reaching out for placement.  Patient will be placed in boarder status at this time.   6:08 PM Spoke with Zannie Kehr, pt's brother who is also her POA.  States they have plans to move pt to his daughters home who is a NP and has space for her, but is unsure of timeline.  He is asking to be involved in conversation regarding any rehab placement here.   Amount and/or Complexity of Data Reviewed Labs: ordered.    Details: Labs reviewed, urinalysis negative, CBC is normal, she does have a glucose of 169,  her BUN is 35 her creatinine is 1.44, in comparison these numbers are chronic. Radiology: ordered.    Details: CT head, C-spine, hip and pelvis, chest x-ray ordered, no acute findings.  Risk Diagnosis or treatment significantly limited by social determinants of health.           Final Clinical Impression(s) / ED Diagnoses Final diagnoses:  Fall, initial encounter  Failure to thrive in adult    Rx / DC Orders ED Discharge Orders     None         Victoriano Lain 11/18/22 1811    Glendora Score, MD 11/19/22 1711

## 2022-11-18 NOTE — Progress Notes (Signed)
   11/18/22 1558  TOC Brief Assessment  Insurance and Status Reviewed  Patient has primary care physician Yes  Home environment has been reviewed Home Alone  Prior level of function: needs assistance  Prior/Current Home Services No current home services  Social Determinants of Health Reivew SDOH reviewed no interventions necessary  Readmission risk has been reviewed Yes  Transition of care needs transition of care needs identified, TOC will continue to follow   Patient in ED, [facility] consulted for SNF placement. PT eval pending, FL2 started.

## 2022-11-19 NOTE — TOC Initial Note (Signed)
Transition of Care [facility]) - Initial/Assessment Note    Patient Details  Name: Kelly Conrad MRN: [id number] Date of Birth: 07-May-1931  [facility] CM/SW Contact:    Elliot Gault, LCSW Phone Number: 11/19/2022, 10:36 AM  Clinical Narrative:                  Pt from home. PT recommending SNF rehab.  Pt not fully oriented. Spoke with pt's brother/POA to assess and review dc plans. Bill agreeable to SNF bed search. He states he and his other brother will be here Sunday to take care of some of pt's personal business. Their plan is to take her back to [place] with them once she finishes the rehab. Annette Stable states that his daughter has a room for pt in her home.  Will start bed search and follow for dc planning.  Expected Discharge Plan: Skilled Nursing Facility Barriers to Discharge: Insurance Authorization, SNF Pending bed offer   Patient Goals and CMS Choice Patient states their goals for this hospitalization and ongoing recovery are:: rehab CMS Medicare.gov Compare Post Acute Care list provided to:: Patient Represenative (must comment) Choice offered to / list presented to : [facility] POA / Guardian [place] ownership interest in [facility].provided to:: [facility] POA / Guardian    Expected Discharge Plan and Services In-house Referral: Clinical Social Work     Living arrangements for the past 2 months: Skilled Nursing Facility                                      Prior Living Arrangements/Services Living arrangements for the past 2 months: Skilled Nursing Facility Lives with:: Self Patient language and need for interpreter reviewed:: Yes Do you feel safe going back to the place where you live?: Yes      Need for Family Participation in Patient Care: Yes (Comment) Care giver support system in place?: No (comment)   Criminal Activity/Legal Involvement Pertinent to Current Situation/Hospitalization: No - Comment as needed  Activities of Daily Living       Permission Sought/Granted Permission sought to share information with : Facility Industrial/product designer granted to share information with : Yes, Verbal Permission Granted     Permission granted to share info w AGENCY: snfs        Emotional Assessment       Orientation: : Oriented to Self Alcohol / Substance Use: Not Applicable Psych Involvement: No (comment)  Admission diagnosis:  altered mental status Patient Active Problem List   Diagnosis Date Noted   Closed fracture of left hip requiring operative repair with nonunion 09/26/2015   Avascular necrosis of bone of left hip (HCC) 09/26/2015   Pertrochanteric fracture of left femur (HCC) 04/20/2015   Intertrochanteric fracture of left femur (HCC) 04/19/2015   Chronic asthma without complication 04/19/2015   Hypothyroidism (acquired) 04/19/2015   Fracture, intertrochanteric, left femur (HCC) 04/19/2015   Closed left hip fracture (HCC)    Hx of bacterial pneumonia 11/11/2014   Morbid obesity due to excess calories (HCC) complicated by hyperlipidemia and hbp 09/24/2014   Pneumonia due to Haemophilus influenzae with late R parapneumonic effusion/scarring  07/14/2014   Acute respiratory failure (HCC)    Dyspnea on exertion    CAP (community acquired pneumonia) 06/28/2014   Sepsis (HCC) 06/28/2014   Blood poisoning    Visit for preventive health examination 11/24/2012   Medicare annual wellness visit,  subsequent 11/24/2012   Tremor 05/22/2012   Hearing aid worn 05/22/2012   Benign hypertensive renal disease 05/22/2012   History of positive PPD    Renal insufficiency 04/29/2011   Dementia (HCC) 04/23/2011   Chronic cough 06/15/2009   Osteoporosis 04/20/2009   VITAMIN D DEFICIENCY 01/29/2008   ASTHMA 01/29/2008   HYPOTHYROIDISM NOS 01/07/2007   ADVEF, DRUG/MED/BIOL SUBST, OTHER DRUG NOS 01/07/2007   HYPERLIPIDEMIA 01/03/2007   Essential hypertension 01/03/2007   ALLERGIC RHINITIS 01/03/2007   GERD 01/03/2007    PCP:  Lorenda Ishihara, MD Pharmacy:   [facility] [id number] - [place], [place] - [address] [place] [address] Phone: [phone] Fax: [phone]     Social Determinants of Health (SDOH) Social History: SDOH Screenings   Tobacco Use: Low Risk  (11/18/2022)   SDOH Interventions:     Readmission Risk Interventions     No data to display

## 2022-11-19 NOTE — NC FL2 (Signed)
[place] MEDICAID FL2 LEVEL OF CARE FORM     IDENTIFICATION  Patient Name: NYTIA EVITTS Birthdate: September 26, 1931 Sex: female Admission Date (Current Location): 11/18/2022  [facility] and [place] Number:  [employer] and Address:  [facility],  [address] [place] [address]      Provider Number: [id number]  Attending Physician Name and Address:  System, Provider Not In  Relative Name and Phone Number:  chamberlin,bill (Brother)  [phone]    Current Level of Care: Hospital Recommended Level of Care: Skilled Nursing Facility Prior Approval Number:    Date Approved/Denied:   PASRR Number: [id number]  Discharge Plan: SNF    Current Diagnoses: Patient Active Problem List   Diagnosis Date Noted   Closed fracture of left hip requiring operative repair with nonunion 09/26/2015   Avascular necrosis of bone of left hip (HCC) 09/26/2015   Pertrochanteric fracture of left femur (HCC) 04/20/2015   Intertrochanteric fracture of left femur (HCC) 04/19/2015   Chronic asthma without complication 04/19/2015   Hypothyroidism (acquired) 04/19/2015   Fracture, intertrochanteric, left femur (HCC) 04/19/2015   Closed left hip fracture (HCC)    Hx of bacterial pneumonia 11/11/2014   Morbid obesity due to excess calories (HCC) complicated by hyperlipidemia and hbp 09/24/2014   Pneumonia due to Haemophilus influenzae with late R parapneumonic effusion/scarring  07/14/2014   Acute respiratory failure (HCC)    Dyspnea on exertion    CAP (community acquired pneumonia) 06/28/2014   Sepsis (HCC) 06/28/2014   Blood poisoning    Visit for preventive health examination 11/24/2012   Medicare annual wellness visit, subsequent 11/24/2012   Tremor 05/22/2012   Hearing aid worn 05/22/2012   Benign hypertensive renal disease 05/22/2012   History of positive PPD    Renal insufficiency 04/29/2011   Dementia (HCC) 04/23/2011   Chronic cough 06/15/2009   Osteoporosis  04/20/2009   VITAMIN D DEFICIENCY 01/29/2008   ASTHMA 01/29/2008   HYPOTHYROIDISM NOS 01/07/2007   ADVEF, DRUG/MED/BIOL SUBST, OTHER DRUG NOS 01/07/2007   HYPERLIPIDEMIA 01/03/2007   Essential hypertension 01/03/2007   ALLERGIC RHINITIS 01/03/2007   GERD 01/03/2007    Orientation RESPIRATION BLADDER Height & Weight     Self  Normal Incontinent Weight: 171 lb 15.3 oz (78 kg) Height:  5\' 1"  (154.9 cm)  BEHAVIORAL SYMPTOMS/MOOD NEUROLOGICAL BOWEL NUTRITION STATUS      Continent Diet (See d/c orders)  AMBULATORY STATUS COMMUNICATION OF NEEDS Skin   Limited Assist Verbally Normal                       Personal Care Assistance Level of Assistance  Bathing, Feeding, Dressing Bathing Assistance: Limited assistance Feeding assistance: Limited assistance Dressing Assistance: Limited assistance     Functional Limitations Info  Sight, Hearing, Speech Sight Info: Adequate Hearing Info: Adequate      SPECIAL CARE FACTORS FREQUENCY  PT (By licensed PT)     PT Frequency: 5x weekly              Contractures Contractures Info: Not present    Additional Factors Info  Allergies, Code Status Code Status Info: FULL Allergies Info: No known allergies           Current Medications (11/19/2022):  This is the current hospital active medication list Current Facility-Administered Medications  Medication Dose Route Frequency Provider Last Rate Last Admin   albuterol (PROVENTIL) (2.5 MG/3ML) 0.083% nebulizer solution 2.5 mg  2.5 mg Nebulization TID PRN Burgess Amor, PA-C  aspirin chewable tablet 81 mg  81 mg Oral Daily Burgess Amor, PA-C   81 mg at 11/19/22 5784   hydrochlorothiazide (HYDRODIURIL) tablet 12.5 mg  12.5 mg Oral Daily Burgess Amor, PA-C   12.5 mg at 11/19/22 6962   mometasone-formoterol (DULERA) 200-5 MCG/ACT inhaler 2 puff  2 puff Inhalation BID Burgess Amor, PA-C   2 puff at 11/19/22 0907   montelukast (SINGULAIR) tablet 10 mg  10 mg Oral QHS Kommor, Madison, MD        multivitamin with minerals tablet 1 tablet  1 tablet Oral Daily Kommor, Madison, MD   1 tablet at 11/19/22 0907   pantoprazole (PROTONIX) EC tablet 40 mg  40 mg Oral Daily Burgess Amor, PA-C   40 mg at 11/19/22 9528   simvastatin (ZOCOR) tablet 40 mg  40 mg Oral QHS Burgess Amor, PA-C       Current Outpatient Medications  Medication Sig Dispense Refill   albuterol (PROVENTIL) (2.5 MG/3ML) 0.083% nebulizer solution SMARTSIG:1 Vial(s) Via Nebulizer 3 Times Daily PRN     alendronate (FOSAMAX) 70 MG tablet Take 70 mg by mouth once a week.     aspirin 81 MG tablet Take 81 mg daily by mouth.     Calcium Carbonate-Vitamin D (CALCIUM-VITAMIN D3 PO) Take 600 mg by mouth daily.     Cyanocobalamin (VITAMIN B-12 PO) Take 1 tablet daily by mouth.     diphenhydrAMINE (BENADRYL) 25 MG tablet Take 25 mg by mouth every 6 (six) hours as needed for allergies.     furosemide (LASIX) 20 MG tablet Take 1 tablet (20 mg total) by mouth as needed.     hydrochlorothiazide (MICROZIDE) 12.5 MG capsule TAKE 1 CAPSULE (12.5 MG TOTAL) BY MOUTH DAILY. 90 capsule 1   montelukast (SINGULAIR) 10 MG tablet Take 10 mg by mouth daily.     Multiple Vitamins-Minerals (CENTRUM SILVER PO) Take 1 tablet by mouth daily.      neomycin-polymyxin b-dexamethasone (MAXITROL) 3.5-10000-0.1 OINT APPLY A SMALL AMOUNT ONTO SUTURES OR OPERATIVE SITE TWICE DAILY FOR 3 DAYS THEN STOP     Omega-3 Fatty Acids (FISH OIL) 1000 MG CPDR Take 1 tablet by mouth daily.     pantoprazole (PROTONIX) 40 MG tablet TAKE 1 TABLET(40 MG) BY MOUTH DAILY 30 TO 60 MINUTES BEFORE FIRST MEAL OF THE DAY 30 tablet 5   predniSONE (DELTASONE) 10 MG tablet Take 10 mg by mouth See admin instructions.     PROAIR RESPICLICK 108 (90 Base) MCG/ACT AEPB INHALE 1 PUFF BY MOUTH EVERY 12 HOURS AS NEEDED     simvastatin (ZOCOR) 40 MG tablet TAKE 1 TABLET BY MOUTH AT BEDTIME 90 tablet 0   vitamin C (ASCORBIC ACID) 500 MG tablet Take 500 mg by mouth daily.     WIXELA INHUB 250-50  MCG/DOSE AEPB 1 puff 2 (two) times daily.     Facility-Administered Medications Ordered in Other Encounters  Medication Dose Route Frequency Provider Last Rate Last Admin   chlorhexidine (HIBICLENS) 4 % liquid 4 application  60 mL Topical Once Swinteck, Arlys John, MD       chlorhexidine (HIBICLENS) 4 % liquid 4 application  60 mL Topical Once Samson Frederic, MD       povidone-iodine 10 % swab 2 application  2 application  Topical Once Samson Frederic, MD         Discharge Medications: Please see discharge summary for a list of discharge medications.  Relevant Imaging Results:  Relevant Lab Results:   Additional Information SSN:  [id number]  Karn Cassis, LCSW

## 2022-11-19 NOTE — Plan of Care (Signed)
  Problem: Acute Rehab PT Goals(only PT should resolve) Goal: Pt Will Go Supine/Side To Sit Outcome: Progressing Flowsheets (Taken 11/19/2022 1028) Pt will go Supine/Side to Sit: with supervision Goal: Patient Will Transfer Sit To/From Stand Outcome: Progressing Flowsheets (Taken 11/19/2022 1028) Patient will transfer sit to/from stand: with supervision Goal: Pt Will Transfer Bed To Chair/Chair To Bed Outcome: Progressing Flowsheets (Taken 11/19/2022 1028) Pt will Transfer Bed to Chair/Chair to Bed: with supervision Goal: Pt Will Ambulate Outcome: Progressing Flowsheets (Taken 11/19/2022 1028) Pt will Ambulate:  75 feet  with contact guard assist  with rolling walker   10:29 AM, 11/19/22 Ocie Bob, MPT Physical Therapist with [facility] [phone] office [phone] mobile phone

## 2022-11-19 NOTE — Evaluation (Signed)
Physical Therapy Evaluation Patient Details Name: Kelly Conrad MRN: [id number] DOB: 1931-07-28 Today's Date: 11/19/2022  History of Present Illness  Kelly Conrad is a 89 y.o. female with a history including hypertension, GERD, dementia, hyperlipidemia and arthritis, also asthma presenting for evaluation of a fall which occurred prior to arrival.  She was walking towards her neighbors home when she tripped and fell, landing on her buttocks per patient report.  The fall was unwitnessed.  Apparently her neighbor saw her sitting outside in assisted her and called EMS.  She is pleasantly demented did, cooperative, alert and oriented x 1, is unable to name place or time.  It is unclear to me if this is her baseline.  However it is concerning that patient currently lives alone.  She did have a caregiver and whose home she lived and still lives, however he unfortunately died in 09/15/22 and she has been living there alone since.  She is vague when I asked her how she takes care of herself, grocery shops, gets her meals.  She does state that she eats well and ate a chicken sandwich and chocolate cake today before arrival.  When I asked her about 5 minutes later what she ate today she was unable to answer the question.   Clinical Impression  Patient demonstrates slow labored movement for sitting up at bedside, c/o mild dizziness upon sitting, unsteady on feet and had to lean on armrest of chair during transfers, and required use of RW for safety.  Patient demonstrates slow labored cadence without loss of balance, limited mostly due to fatigue/generalized weakness and tolerated sitting up in chair after therapy.  Patient will benefit from continued skilled physical therapy in hospital and recommended venue below to increase strength, balance, endurance for safe ADLs and gait.          If plan is discharge home, recommend the following: A lot of help with walking and/or transfers;A little help with  bathing/dressing/bathroom;Assistance with cooking/housework;Help with stairs or ramp for entrance   Can travel by private vehicle   Yes    Equipment Recommendations None recommended by PT  Recommendations for Other Services       Functional Status Assessment       Precautions / Restrictions Precautions Precautions: Fall Restrictions Weight Bearing Restrictions: No      Mobility  Bed Mobility Overal bed mobility: Needs Assistance Bed Mobility: Supine to Sit     Supine to sit: Contact guard, Min assist     General bed mobility comments: increased time, labored movement    Transfers Overall transfer level: Needs assistance Equipment used: Rolling walker (2 wheels), 1 person hand held assist Transfers: Sit to/from Stand, Bed to chair/wheelchair/BSC Sit to Stand: Contact guard assist   Step pivot transfers: Min assist       General transfer comment: has to lean on armrest of chair when not using an AD, safer using RW    Ambulation/Gait Ambulation/Gait assistance: Contact guard assist, Min assist Gait Distance (Feet): 40 Feet Assistive device: Rolling walker (2 wheels) Gait Pattern/deviations: Decreased step length - right, Decreased step length - left, Decreased stride length Gait velocity: decreased     General Gait Details: slow labored movement without loss of balance using RW, limited mostly due to fatigue  Stairs            Wheelchair Mobility     Tilt Bed    Modified Rankin (Stroke Patients Only)       Balance Overall balance  assessment: Needs assistance Sitting-balance support: Feet supported, No upper extremity supported Sitting balance-Leahy Scale: Fair Sitting balance - Comments: fair/good seated at EOB   Standing balance support: During functional activity, No upper extremity supported Standing balance-Leahy Scale: Poor Standing balance comment: fair using RW                             Pertinent Vitals/Pain Pain  Assessment Pain Assessment: No/denies pain    Home Living Family/patient expects to be discharged to:: Private residence Living Arrangements: Alone Available Help at Discharge: Neighbor;Available PRN/intermittently Type of Home: House           Home Equipment: Agricultural consultant (2 wheels);Rollator (4 wheels) Additional Comments: Patient is poor historian    Prior Function Prior Level of Function : Needs assist       Physical Assist : Mobility (physical);ADLs (physical) Mobility (physical): Bed mobility;Transfers;Gait;Stairs   Mobility Comments: household and short community distances using RW ADLs Comments: Assisted by neighbors     Extremity/Trunk Assessment   Upper Extremity Assessment Upper Extremity Assessment: Generalized weakness    Lower Extremity Assessment Lower Extremity Assessment: Generalized weakness    Cervical / Trunk Assessment Cervical / Trunk Assessment: Normal  Communication   Communication Communication: No apparent difficulties Cueing Techniques: Verbal cues;Tactile cues  Cognition Arousal: Alert Behavior During Therapy: WFL for tasks assessed/performed Overall Cognitive Status: History of cognitive impairments - at baseline                                 General Comments: poor response when asked about home set up        General Comments      Exercises     Assessment/Plan    PT Assessment Patient needs continued PT services  PT Problem List Decreased strength;Decreased activity tolerance;Decreased balance;Decreased mobility       PT Treatment Interventions DME instruction;Gait training;Stair training;Functional mobility training;Therapeutic activities;Therapeutic exercise;Balance training;Patient/family education    PT Goals (Current goals can be found in the Care Plan section)  Acute Rehab PT Goals Patient Stated Goal: return home with friends/family to assist PT Goal Formulation: With patient Time For Goal  Achievement: 12/03/22 Potential to Achieve Goals: Good    Frequency Min 2X/week     Co-evaluation               AM-PAC PT "6 Clicks" Mobility  Outcome Measure Help needed turning from your back to your side while in a flat bed without using bedrails?: A Little Help needed moving from lying on your back to sitting on the side of a flat bed without using bedrails?: A Little Help needed moving to and from a bed to a chair (including a wheelchair)?: A Little Help needed standing up from a chair using your arms (e.g., wheelchair or bedside chair)?: A Little Help needed to walk in hospital room?: A Lot Help needed climbing 3-5 steps with a railing? : A Lot 6 Click Score: 16    End of Session   Activity Tolerance: Patient tolerated treatment well;Patient limited by fatigue Patient left: in chair;with call bell/phone within reach Nurse Communication: Mobility status PT Visit Diagnosis: Unsteadiness on feet (R26.81);Other abnormalities of gait and mobility (R26.89);Muscle weakness (generalized) (M62.81)    Time: 9811-9147 PT Time Calculation (min) (ACUTE ONLY): 21 min   Charges:   PT Evaluation $PT Eval Moderate Complexity: 1 Mod PT Treatments $  Therapeutic Activity: 8-22 mins PT General Charges $$ ACUTE PT VISIT: 1 Visit         10:26 AM, 11/19/22 Ocie Bob, MPT Physical Therapist with [facility] [phone] office [phone] mobile phone

## 2022-11-19 NOTE — ED Provider Notes (Signed)
Emergency Medicine Observation Re-evaluation Note  Kelly Conrad is a 89 y.o. female, seen on rounds today.  Pt initially presented to the ED for complaints of Altered Mental Status Currently, the patient is asleep.  Physical Exam  BP (!) 128/47   Pulse 61   Temp 98.5 F (36.9 C) (Oral)   Resp 20   Ht 5\' 1"  (1.549 m)   Wt 78 kg   SpO2 95%   BMI 32.49 kg/m  Physical Exam General: No distress Cardiac: Regular rate Lungs: No respiratory distress Psych: Currently calm  ED Course / MDM  EKG:   I have reviewed the labs performed to date as well as medications administered while in observation.  Recent changes in the last 24 hours include -patient came to the ER yesterday after a fall.  There is concerns for living situation and social work team and PT consult have been placed.  Home medications ordered..  Plan  Current plan is for patient to be held in the ED until PT evaluation is completed and placement plan finalized.    Derwood Kaplan, MD 11/19/22 [phone]

## 2022-11-20 DIAGNOSIS — R5381 Other malaise: Secondary | ICD-10-CM | POA: Diagnosis not present

## 2022-11-20 DIAGNOSIS — K219 Gastro-esophageal reflux disease without esophagitis: Secondary | ICD-10-CM | POA: Diagnosis not present

## 2022-11-20 DIAGNOSIS — R262 Difficulty in walking, not elsewhere classified: Secondary | ICD-10-CM | POA: Diagnosis not present

## 2022-11-20 DIAGNOSIS — J309 Allergic rhinitis, unspecified: Secondary | ICD-10-CM | POA: Diagnosis not present

## 2022-11-20 DIAGNOSIS — R7309 Other abnormal glucose: Secondary | ICD-10-CM | POA: Diagnosis not present

## 2022-11-20 DIAGNOSIS — J45909 Unspecified asthma, uncomplicated: Secondary | ICD-10-CM | POA: Diagnosis not present

## 2022-11-20 DIAGNOSIS — M810 Age-related osteoporosis without current pathological fracture: Secondary | ICD-10-CM | POA: Diagnosis not present

## 2022-11-20 DIAGNOSIS — Z7401 Bed confinement status: Secondary | ICD-10-CM | POA: Diagnosis not present

## 2022-11-20 DIAGNOSIS — I10 Essential (primary) hypertension: Secondary | ICD-10-CM | POA: Diagnosis not present

## 2022-11-20 DIAGNOSIS — R059 Cough, unspecified: Secondary | ICD-10-CM | POA: Diagnosis not present

## 2022-11-20 DIAGNOSIS — Z7982 Long term (current) use of aspirin: Secondary | ICD-10-CM | POA: Diagnosis not present

## 2022-11-20 DIAGNOSIS — E785 Hyperlipidemia, unspecified: Secondary | ICD-10-CM | POA: Diagnosis not present

## 2022-11-20 DIAGNOSIS — R627 Adult failure to thrive: Secondary | ICD-10-CM | POA: Diagnosis not present

## 2022-11-20 DIAGNOSIS — M1990 Unspecified osteoarthritis, unspecified site: Secondary | ICD-10-CM | POA: Diagnosis not present

## 2022-11-20 DIAGNOSIS — B309 Viral conjunctivitis, unspecified: Secondary | ICD-10-CM | POA: Diagnosis not present

## 2022-11-20 DIAGNOSIS — F0390 Unspecified dementia without behavioral disturbance: Secondary | ICD-10-CM | POA: Diagnosis not present

## 2022-11-20 DIAGNOSIS — E559 Vitamin D deficiency, unspecified: Secondary | ICD-10-CM | POA: Diagnosis not present

## 2022-11-20 DIAGNOSIS — H109 Unspecified conjunctivitis: Secondary | ICD-10-CM | POA: Diagnosis not present

## 2022-11-20 DIAGNOSIS — Z79899 Other long term (current) drug therapy: Secondary | ICD-10-CM | POA: Diagnosis not present

## 2022-11-20 DIAGNOSIS — R6889 Other general symptoms and signs: Secondary | ICD-10-CM | POA: Diagnosis not present

## 2022-11-20 DIAGNOSIS — M6281 Muscle weakness (generalized): Secondary | ICD-10-CM | POA: Diagnosis not present

## 2022-11-20 DIAGNOSIS — R531 Weakness: Secondary | ICD-10-CM | POA: Diagnosis not present

## 2022-11-20 DIAGNOSIS — R944 Abnormal results of kidney function studies: Secondary | ICD-10-CM | POA: Diagnosis not present

## 2022-11-20 NOTE — ED Notes (Signed)
Pt found to have had a BM and urinated in the floor.  Pt cleaned up by ED staff and linens changed.

## 2022-11-20 NOTE — ED Notes (Signed)
Report given to [facility] at [facility]

## 2022-11-20 NOTE — ED Notes (Signed)
Pt sitting in recliner, comfortable, and in no distress at this time. Will continue care

## 2022-11-20 NOTE — [place] FL2 (Signed)
[place] MEDICAID FL2 LEVEL OF CARE FORM     IDENTIFICATION  Patient Name: Kelly Conrad Birthdate: [birth year] Sex: female Admission Date (Current Location): 11/18/2022  [facility] and [place] Number:  [employer] and Address:  [facility],  [address] [place] [address]      Provider Number: [id number]  Attending Physician Name and Address:  System, Provider Not In  Relative Name and Phone Number:  chamberlin,bill (Brother)  [phone]    Current Level of Care: Hospital Recommended Level of Care: Skilled Nursing Facility Prior Approval Number:    Date Approved/Denied:   PASRR Number: [id number]  Discharge Plan: SNF    Current Diagnoses: Patient Active Problem List   Diagnosis Date Noted   Closed fracture of left hip requiring operative repair with nonunion 09/26/2015   Avascular necrosis of bone of left hip (HCC) 09/26/2015   Pertrochanteric fracture of left femur (HCC) 04/20/2015   Intertrochanteric fracture of left femur (HCC) 04/19/2015   Chronic asthma without complication 04/19/2015   Hypothyroidism (acquired) 04/19/2015   Fracture, intertrochanteric, left femur (HCC) 04/19/2015   Closed left hip fracture (HCC)    Hx of bacterial pneumonia 11/11/2014   Morbid obesity due to excess calories (HCC) complicated by hyperlipidemia and hbp 09/24/2014   Pneumonia due to Haemophilus influenzae with late R parapneumonic effusion/scarring  07/14/2014   Acute respiratory failure (HCC)    Dyspnea on exertion    CAP (community acquired pneumonia) 06/28/2014   Sepsis (HCC) 06/28/2014   Blood poisoning    Visit for preventive health examination 11/24/2012   Medicare annual wellness visit, subsequent 11/24/2012   Tremor 05/22/2012   Hearing aid worn 05/22/2012   Benign hypertensive renal disease 05/22/2012   History of positive PPD    Renal insufficiency 04/29/2011   Dementia (HCC) 04/23/2011   Chronic cough 06/15/2009   Osteoporosis  04/20/2009   VITAMIN D DEFICIENCY 01/29/2008   ASTHMA 01/29/2008   HYPOTHYROIDISM NOS 01/07/2007   ADVEF, DRUG/MED/BIOL SUBST, OTHER DRUG NOS 01/07/2007   HYPERLIPIDEMIA 01/03/2007   Essential hypertension 01/03/2007   ALLERGIC RHINITIS 01/03/2007   GERD 01/03/2007    Orientation RESPIRATION BLADDER Height & Weight     Self  Normal Incontinent Weight: 171 lb 15.3 oz (78 kg) Height:  5\' 1"  (154.9 cm)  BEHAVIORAL SYMPTOMS/MOOD NEUROLOGICAL BOWEL NUTRITION STATUS      Continent Diet (See d/c orders)  AMBULATORY STATUS COMMUNICATION OF NEEDS Skin   Limited Assist Verbally Normal                       Personal Care Assistance Level of Assistance  Bathing, Feeding, Dressing Bathing Assistance: Limited assistance Feeding assistance: Limited assistance Dressing Assistance: Limited assistance     Functional Limitations Info  Sight, Hearing, Speech Sight Info: Adequate Hearing Info: Adequate      SPECIAL CARE FACTORS FREQUENCY  PT (By licensed PT)     PT Frequency: 5x weekly              Contractures Contractures Info: Not present    Additional Factors Info  Allergies, Code Status Code Status Info: FULL Allergies Info: No known allergies           Current Medications (11/20/2022):  This is the current hospital active medication list Current Facility-Administered Medications  Medication Dose Route Frequency Provider Last Rate Last Admin   albuterol (PROVENTIL) (2.5 MG/3ML) 0.083% nebulizer solution 2.5 mg  2.5 mg Nebulization TID PRN Burgess Amor, PA-C  aspirin chewable tablet 81 mg  81 mg Oral Daily Burgess Amor, PA-C   81 mg at 11/20/22 1013   hydrochlorothiazide (HYDRODIURIL) tablet 12.5 mg  12.5 mg Oral Daily Burgess Amor, PA-C   12.5 mg at 11/20/22 1014   mometasone-formoterol (DULERA) 200-5 MCG/ACT inhaler 2 puff  2 puff Inhalation BID Burgess Amor, PA-C   2 puff at 11/20/22 0839   montelukast (SINGULAIR) tablet 10 mg  10 mg Oral QHS Kommor, Madison, MD    10 mg at 11/19/22 2207   multivitamin with minerals tablet 1 tablet  1 tablet Oral Daily Kommor, Madison, MD   1 tablet at 11/20/22 1014   pantoprazole (PROTONIX) EC tablet 40 mg  40 mg Oral Daily Burgess Amor, PA-C   40 mg at 11/20/22 1012   simvastatin (ZOCOR) tablet 40 mg  40 mg Oral QHS Burgess Amor, PA-C   40 mg at 11/19/22 2207   Current Outpatient Medications  Medication Sig Dispense Refill   albuterol (PROVENTIL) (2.5 MG/3ML) 0.083% nebulizer solution SMARTSIG:1 Vial(s) Via Nebulizer 3 Times Daily PRN     alendronate (FOSAMAX) 70 MG tablet Take 70 mg by mouth once a week.     hydrochlorothiazide (MICROZIDE) 12.5 MG capsule TAKE 1 CAPSULE (12.5 MG TOTAL) BY MOUTH DAILY. 90 capsule 1   montelukast (SINGULAIR) 10 MG tablet Take 10 mg by mouth daily.     simvastatin (ZOCOR) 40 MG tablet TAKE 1 TABLET BY MOUTH AT BEDTIME 90 tablet 0   WIXELA INHUB 250-50 MCG/DOSE AEPB 1 puff 2 (two) times daily.     aspirin 81 MG tablet Take 81 mg daily by mouth.     Multiple Vitamins-Minerals (CENTRUM SILVER PO) Take 1 tablet by mouth daily.  (Patient not taking: Reported on 11/19/2022)     pantoprazole (PROTONIX) 40 MG tablet TAKE 1 TABLET(40 MG) BY MOUTH DAILY 30 TO 60 MINUTES BEFORE FIRST MEAL OF THE DAY (Patient not taking: Reported on 11/19/2022) 30 tablet 5   Facility-Administered Medications Ordered in Other Encounters  Medication Dose Route Frequency Provider Last Rate Last Admin   chlorhexidine (HIBICLENS) 4 % liquid 4 application  60 mL Topical Once Swinteck, Arlys John, MD       chlorhexidine (HIBICLENS) 4 % liquid 4 application  60 mL Topical Once Samson Frederic, MD       povidone-iodine 10 % swab 2 application  2 application  Topical Once Samson Frederic, MD         Discharge Medications: Please see AVS for a list of discharge medications.  Relevant Imaging Results:  Relevant Lab Results:   Additional Information SSN: [id number]  Elliot Gault, LCSW

## 2022-11-20 NOTE — TOC Transition Note (Signed)
Transition of Care [facility]) - CM/SW Discharge Note   Patient Details  Name: SHADEE EILERS MRN: [id number] Date of Birth: Mar 28, 1931  [facility] CM/SW Contact:  Elliot Gault, LCSW Phone Number: 11/20/2022, 1:51 PM   Clinical Narrative:     Pt has new bed offer from [facility] which was reviewed with pt's brothers. They have decided to select this bed offer. Updated insurance and authorization was received.   Clinical sent electronically to [facility]. RN to call report. EMS arranged.  Brothers aware and in agreement with dc plan. No other TOC needs.  Final next level of care: Skilled Nursing Facility Barriers to Discharge: Barriers Resolved   Patient Goals and CMS Choice CMS Medicare.gov Compare Post Acute Care list provided to:: Patient Represenative (must comment) Choice offered to / list presented to : [facility] POA / Guardian  Discharge Placement                Patient chooses bed at: [facility] Patient to be transferred to facility by: EMS Name of family member notified: Bill Patient and family notified of of transfer: 11/20/22  Discharge Plan and Services Additional resources added to the After Visit Summary for   In-house Referral: Clinical Social Work                                   Social Determinants of Health (SDOH) Interventions SDOH Screenings   Tobacco Use: Low Risk  (11/18/2022)     Readmission Risk Interventions     No data to display

## 2022-11-20 NOTE — ED Notes (Signed)
Per TOC, Pt can go to [facility] after 2p.   Pt gave her cell phone to her friend, Ree Kida, so he can charge it.  Ree Kida made aware she is transferring to facility this afternoon.

## 2022-11-20 NOTE — ED Provider Notes (Addendum)
Emergency Medicine Observation Re-evaluation Note  Kelly Conrad is a 89 y.o. female, seen on rounds today.  Pt initially presented to the ED for complaints of Altered Mental Status Currently, the patient is sleeping.  Physical Exam  BP (!) 136/49   Pulse 79   Temp 98.5 F (36.9 C)   Resp 15   Ht 5\' 1"  (1.549 m)   Wt 78 kg   SpO2 96%   BMI 32.49 kg/m  Physical Exam General: Sleeping Cardiac: Extremities well-perfused, normal heart rate Lungs: Breathing is unlabored Psych: Deferred  ED Course / MDM  EKG:   I have reviewed the labs performed to date as well as medications administered while in observation.  Recent changes in the last 24 hours include TOC working on SNF placement.  Plan  Current plan is for SNF placement.  Social work arranged SNF placement.  Patient to be discharged.   Gloris Manchester, MD 11/20/22 [id number]    Gloris Manchester, MD 11/20/22 [phone]

## 2022-11-21 DIAGNOSIS — H109 Unspecified conjunctivitis: Secondary | ICD-10-CM | POA: Diagnosis not present

## 2022-11-26 DIAGNOSIS — M810 Age-related osteoporosis without current pathological fracture: Secondary | ICD-10-CM | POA: Diagnosis not present

## 2022-11-26 DIAGNOSIS — R627 Adult failure to thrive: Secondary | ICD-10-CM | POA: Diagnosis not present

## 2022-11-26 DIAGNOSIS — I10 Essential (primary) hypertension: Secondary | ICD-10-CM | POA: Diagnosis not present

## 2022-11-26 DIAGNOSIS — H109 Unspecified conjunctivitis: Secondary | ICD-10-CM | POA: Diagnosis not present

## 2022-11-26 DIAGNOSIS — R5381 Other malaise: Secondary | ICD-10-CM | POA: Diagnosis not present

## 2022-11-26 DIAGNOSIS — E785 Hyperlipidemia, unspecified: Secondary | ICD-10-CM | POA: Diagnosis not present

## 2022-11-26 DIAGNOSIS — M1990 Unspecified osteoarthritis, unspecified site: Secondary | ICD-10-CM | POA: Diagnosis not present

## 2022-11-26 DIAGNOSIS — J45909 Unspecified asthma, uncomplicated: Secondary | ICD-10-CM | POA: Diagnosis not present

## 2022-11-26 DIAGNOSIS — K219 Gastro-esophageal reflux disease without esophagitis: Secondary | ICD-10-CM | POA: Diagnosis not present

## 2022-11-27 DIAGNOSIS — I10 Essential (primary) hypertension: Secondary | ICD-10-CM | POA: Diagnosis not present

## 2022-11-29 DIAGNOSIS — H109 Unspecified conjunctivitis: Secondary | ICD-10-CM | POA: Diagnosis not present

## 2022-11-29 DIAGNOSIS — J45909 Unspecified asthma, uncomplicated: Secondary | ICD-10-CM | POA: Diagnosis not present

## 2022-11-29 DIAGNOSIS — M810 Age-related osteoporosis without current pathological fracture: Secondary | ICD-10-CM | POA: Diagnosis not present

## 2022-11-29 DIAGNOSIS — R5381 Other malaise: Secondary | ICD-10-CM | POA: Diagnosis not present

## 2022-11-29 DIAGNOSIS — R627 Adult failure to thrive: Secondary | ICD-10-CM | POA: Diagnosis not present

## 2022-11-29 DIAGNOSIS — M1990 Unspecified osteoarthritis, unspecified site: Secondary | ICD-10-CM | POA: Diagnosis not present

## 2022-11-29 DIAGNOSIS — I10 Essential (primary) hypertension: Secondary | ICD-10-CM | POA: Diagnosis not present

## 2022-11-29 DIAGNOSIS — E785 Hyperlipidemia, unspecified: Secondary | ICD-10-CM | POA: Diagnosis not present

## 2022-11-29 DIAGNOSIS — K219 Gastro-esophageal reflux disease without esophagitis: Secondary | ICD-10-CM | POA: Diagnosis not present

## 2022-12-03 DIAGNOSIS — M1990 Unspecified osteoarthritis, unspecified site: Secondary | ICD-10-CM | POA: Diagnosis not present

## 2022-12-03 DIAGNOSIS — M810 Age-related osteoporosis without current pathological fracture: Secondary | ICD-10-CM | POA: Diagnosis not present

## 2022-12-03 DIAGNOSIS — R627 Adult failure to thrive: Secondary | ICD-10-CM | POA: Diagnosis not present

## 2022-12-03 DIAGNOSIS — K219 Gastro-esophageal reflux disease without esophagitis: Secondary | ICD-10-CM | POA: Diagnosis not present

## 2022-12-03 DIAGNOSIS — I10 Essential (primary) hypertension: Secondary | ICD-10-CM | POA: Diagnosis not present

## 2022-12-03 DIAGNOSIS — J45909 Unspecified asthma, uncomplicated: Secondary | ICD-10-CM | POA: Diagnosis not present

## 2022-12-03 DIAGNOSIS — E785 Hyperlipidemia, unspecified: Secondary | ICD-10-CM | POA: Diagnosis not present

## 2022-12-03 DIAGNOSIS — R5381 Other malaise: Secondary | ICD-10-CM | POA: Diagnosis not present

## 2022-12-13 DIAGNOSIS — R2681 Unsteadiness on feet: Secondary | ICD-10-CM | POA: Diagnosis not present

## 2022-12-13 DIAGNOSIS — E559 Vitamin D deficiency, unspecified: Secondary | ICD-10-CM | POA: Diagnosis not present

## 2022-12-18 DIAGNOSIS — E559 Vitamin D deficiency, unspecified: Secondary | ICD-10-CM | POA: Diagnosis not present

## 2022-12-18 DIAGNOSIS — E538 Deficiency of other specified B group vitamins: Secondary | ICD-10-CM | POA: Diagnosis not present

## 2022-12-18 DIAGNOSIS — R609 Edema, unspecified: Secondary | ICD-10-CM | POA: Diagnosis not present

## 2022-12-18 DIAGNOSIS — I119 Hypertensive heart disease without heart failure: Secondary | ICD-10-CM | POA: Diagnosis not present

## 2022-12-20 DIAGNOSIS — R296 Repeated falls: Secondary | ICD-10-CM | POA: Diagnosis not present

## 2022-12-20 DIAGNOSIS — I10 Essential (primary) hypertension: Secondary | ICD-10-CM | POA: Diagnosis not present

## 2022-12-20 DIAGNOSIS — R2681 Unsteadiness on feet: Secondary | ICD-10-CM | POA: Diagnosis not present

## 2022-12-20 DIAGNOSIS — M810 Age-related osteoporosis without current pathological fracture: Secondary | ICD-10-CM | POA: Diagnosis not present

## 2022-12-20 DIAGNOSIS — R251 Tremor, unspecified: Secondary | ICD-10-CM | POA: Diagnosis not present

## 2022-12-20 DIAGNOSIS — M1990 Unspecified osteoarthritis, unspecified site: Secondary | ICD-10-CM | POA: Diagnosis not present

## 2022-12-22 DIAGNOSIS — R488 Other symbolic dysfunctions: Secondary | ICD-10-CM | POA: Diagnosis not present
